# Patient Record
Sex: Female | Born: 1950
Health system: Southern US, Community
[De-identification: ages and names within clinical notes are randomized; demographics above are authoritative.]

## PROBLEM LIST (undated history)

## (undated) DIAGNOSIS — J449 Chronic obstructive pulmonary disease, unspecified: Secondary | ICD-10-CM

## (undated) DIAGNOSIS — E876 Hypokalemia: Secondary | ICD-10-CM

## (undated) DIAGNOSIS — N200 Calculus of kidney: Secondary | ICD-10-CM

## (undated) DIAGNOSIS — D689 Coagulation defect, unspecified: Secondary | ICD-10-CM

## (undated) DIAGNOSIS — N189 Chronic kidney disease, unspecified: Secondary | ICD-10-CM

## (undated) DIAGNOSIS — I607 Nontraumatic subarachnoid hemorrhage from unspecified intracranial artery: Secondary | ICD-10-CM

## (undated) DIAGNOSIS — I10 Essential (primary) hypertension: Secondary | ICD-10-CM

## (undated) DIAGNOSIS — Z4541 Encounter for adjustment and management of cerebrospinal fluid drainage device: Secondary | ICD-10-CM

## (undated) DIAGNOSIS — I701 Atherosclerosis of renal artery: Secondary | ICD-10-CM

## (undated) HISTORY — DX: Chronic obstructive pulmonary disease, unspecified: J44.9

## (undated) HISTORY — DX: Chronic kidney disease, unspecified: N18.9

## (undated) HISTORY — PX: OTHER SURGICAL HISTORY: SHX169

---

## 1999-01-06 ENCOUNTER — Other Ambulatory Visit: Admission: RE | Admit: 1999-01-06 | Discharge: 1999-01-06 | Payer: Self-pay | Admitting: Obstetrics and Gynecology

## 2000-06-14 ENCOUNTER — Other Ambulatory Visit: Admission: RE | Admit: 2000-06-14 | Discharge: 2000-06-14 | Payer: Self-pay | Admitting: Obstetrics and Gynecology

## 2000-07-18 ENCOUNTER — Ambulatory Visit (HOSPITAL_COMMUNITY): Admission: RE | Admit: 2000-07-18 | Discharge: 2000-07-18 | Payer: Self-pay | Admitting: Obstetrics and Gynecology

## 2005-04-24 HISTORY — PX: ABDOMINAL HYSTERECTOMY: SHX81

## 2011-06-30 ENCOUNTER — Emergency Department (HOSPITAL_COMMUNITY): Payer: Medicare Other

## 2011-06-30 ENCOUNTER — Other Ambulatory Visit: Payer: Self-pay

## 2011-06-30 ENCOUNTER — Inpatient Hospital Stay (HOSPITAL_COMMUNITY)
Admission: EM | Admit: 2011-06-30 | Discharge: 2011-07-01 | DRG: 305 | Disposition: A | Discharge status: Home / Self Care | Payer: Medicare Other | Attending: Internal Medicine | Admitting: Internal Medicine

## 2011-06-30 ENCOUNTER — Encounter (HOSPITAL_COMMUNITY): Payer: Self-pay | Admitting: *Deleted

## 2011-06-30 DIAGNOSIS — D689 Coagulation defect, unspecified: Secondary | ICD-10-CM | POA: Diagnosis present

## 2011-06-30 DIAGNOSIS — I160 Hypertensive urgency: Secondary | ICD-10-CM | POA: Diagnosis present

## 2011-06-30 DIAGNOSIS — E1169 Type 2 diabetes mellitus with other specified complication: Secondary | ICD-10-CM | POA: Diagnosis present

## 2011-06-30 DIAGNOSIS — I10 Essential (primary) hypertension: Principal | ICD-10-CM | POA: Diagnosis present

## 2011-06-30 DIAGNOSIS — Z982 Presence of cerebrospinal fluid drainage device: Secondary | ICD-10-CM

## 2011-06-30 DIAGNOSIS — F17200 Nicotine dependence, unspecified, uncomplicated: Secondary | ICD-10-CM | POA: Diagnosis present

## 2011-06-30 DIAGNOSIS — Z87898 Personal history of other specified conditions: Secondary | ICD-10-CM

## 2011-06-30 DIAGNOSIS — E876 Hypokalemia: Secondary | ICD-10-CM | POA: Diagnosis present

## 2011-06-30 DIAGNOSIS — Z720 Tobacco use: Secondary | ICD-10-CM | POA: Diagnosis present

## 2011-06-30 DIAGNOSIS — E119 Type 2 diabetes mellitus without complications: Secondary | ICD-10-CM | POA: Diagnosis present

## 2011-06-30 HISTORY — DX: Essential (primary) hypertension: I10

## 2011-06-30 HISTORY — DX: Coagulation defect, unspecified: D68.9

## 2011-06-30 HISTORY — DX: Hypokalemia: E87.6

## 2011-06-30 HISTORY — DX: Encounter for adjustment and management of cerebrospinal fluid drainage device: Z45.41

## 2011-06-30 HISTORY — DX: Nontraumatic subarachnoid hemorrhage from unspecified intracranial artery: I60.7

## 2011-06-30 LAB — CBC
Hemoglobin: 13.8 g/dL (ref 12.0–15.0)
RBC: 4.89 MIL/uL (ref 3.87–5.11)
WBC: 7.6 10*3/uL (ref 4.0–10.5)

## 2011-06-30 LAB — BASIC METABOLIC PANEL
CO2: 29 mEq/L (ref 19–32)
Chloride: 102 mEq/L (ref 96–112)
Glucose, Bld: 79 mg/dL (ref 70–99)
Potassium: 3.2 mEq/L — ABNORMAL LOW (ref 3.5–5.1)
Sodium: 140 mEq/L (ref 135–145)

## 2011-06-30 LAB — TROPONIN I: Troponin I: <0.3 ng/mL (ref <0.30)

## 2011-06-30 LAB — GLUCOSE, CAPILLARY
Glucose-Capillary: 165 mg/dL — ABNORMAL HIGH (ref 70–99)
Glucose-Capillary: 63 mg/dL — ABNORMAL LOW (ref 70–99)

## 2011-06-30 MED ORDER — LABETALOL HCL 5 MG/ML IV SOLN
40.0000 mg | Freq: Once | INTRAVENOUS | Status: DC
  Filled 2011-06-30: qty 8

## 2011-06-30 MED ORDER — AMLODIPINE BESYLATE 5 MG PO TABS
10.0000 mg | ORAL_TABLET | Freq: Every day | ORAL | Status: DC
  Administered 2011-06-30 – 2011-07-01 (×2): 10 mg via ORAL
  Filled 2011-06-30 (×2): qty 2

## 2011-06-30 MED ORDER — TRIAMTERENE-HCTZ 37.5-25 MG PO TABS
1.0000 | ORAL_TABLET | Freq: Every day | ORAL | Status: DC
  Administered 2011-07-01: 1 via ORAL
  Filled 2011-06-30 (×3): qty 1

## 2011-06-30 MED ORDER — LABETALOL HCL 5 MG/ML IV SOLN
20.0000 mg | Freq: Once | INTRAVENOUS | Status: AC
  Administered 2011-06-30: 20 mg via INTRAVENOUS
  Filled 2011-06-30: qty 4

## 2011-06-30 NOTE — ED Notes (Signed)
Pt w/ hx of hypertension - pt was going to PCP today for lab work - pt was noted to be hypertensive w/ BP of 200/140 - pt also w/ hx of ruptured cerebral aneurysm. Pt denies HA at present.

## 2011-06-30 NOTE — ED Notes (Signed)
Campbell, MD at bedside

## 2011-06-30 NOTE — ED Provider Notes (Cosign Needed)
History     CSN: [id number]  Arrival date & time 06/30/11  Amanda Booker   First MD Initiated Contact with Patient 06/30/11 2005      Chief Complaint  Patient presents with  . Hypertension    (Consider location/radiation/quality/duration/timing/severity/associated sxs/prior treatment) HPI Amanda Booker is a 61 y.o. female  With a h/o hypertension, cerebral aneurysm, diabetes who presents to the Emergency Department complaining of high blood pressure. She was seen in the PCP office today and had a BP of 175/110. This was a follow up visit from two weeks ago when she was seen for similar blood pressure. Once home, daughter took BP which was 200/120. Denies fever, headache, vision changes, trouble speaking or swallowing, extremity weakness, nausea, vomiting.  PCP Dr. Lysbeth Galas   Past Medical History  Diagnosis Date  . Cerebral aneurysm rupture   . Hypertension   . Diabetes mellitus   . Cerebral ventricular shunt fitting or adjustment     No past surgical history on file.  No family history on file.  History  Substance Use Topics  . Smoking status: Current Everyday Smoker  . Smokeless tobacco: Not on file  . Alcohol Use: No    OB History    Grav Para Term Preterm Abortions TAB SAB Ect Mult Living                  Review of Systems A 10 review of systems reviewed and are negative for acute change except as noted in the HPI. Allergies  Review of patient's allergies indicates no known allergies.  Home Medications   Current Outpatient Rx  Name Route Sig Dispense Refill  . ATORVASTATIN CALCIUM 20 MG PO TABS Oral Take 20 mg by mouth daily.    Marland Kitchen GLIPIZIDE 10 MG PO TABS Oral Take 10 mg by mouth 2 (two) times daily.    Marland Kitchen LOSARTAN POTASSIUM 100 MG PO TABS Oral Take 100 mg by mouth daily.    . NEBIVOLOL HCL 10 MG PO TABS Oral Take 10 mg by mouth daily.    . QUINAPRIL HCL 40 MG PO TABS Oral Take 40 mg by mouth daily.    Marland Kitchen SITAGLIPTIN PHOSPHATE 100 MG PO TABS Oral Take 100 mg by  mouth daily.      BP 187/88  Pulse 77  Temp(Src) 98.3 F (36.8 C) (Oral)  Resp 22  Ht 5\' 9"  (1.753 m)  Wt 187 lb (84.823 kg)  BMI 27.62 kg/m2  SpO2 95%  Physical Exam Physical examination:  Nursing notes reviewed; Vital signs and O2 SAT reviewed;  Constitutional: Well developed, Well nourished, Well hydrated, In no acute distress; Head:  Normocephalic, atraumatic; Eyes: EOMI, PERRL, No scleral icterus, no papilledema; ENMT: Mouth and pharynx normal, Mucous membranes moist; Neck: Supple, Full range of motion, No lymphadenopathy; Cardiovascular: Regular rate and rhythm, No murmur, rub, or gallop; Respiratory: Breath sounds clear & equal bilaterally, No rales, rhonchi, wheezes, or rub, Normal respiratory effort/excursion; Chest: Nontender, Movement normal; Abdomen: Soft, Nontender, Nondistended, Normal bowel sounds; Genitourinary: No CVA tenderness; Extremities: Pulses normal, No tenderness, No edema, No calf edema or asymmetry.; Neuro: AA&Ox3, Major CN grossly intact, speech is clear and slightly slowed, which is patient's baseline, mild facial asymetry.  No gross focal motor or sensory deficits in extremities.; Skin: Color normal, Warm, Dry  ED Course  Procedures (including critical care time)  Results for orders placed during the hospital encounter of 06/30/11  CBC      Component Value Range   WBC  7.6  4.0 - 10.5 (K/uL)   RBC 4.89  3.87 - 5.11 (MIL/uL)   Hemoglobin 13.8  12.0 - 15.0 (g/dL)   HCT 96.0  45.4 - 09.8 (%)   MCV 83.4  78.0 - 100.0 (fL)   MCH 28.2  26.0 - 34.0 (pg)   MCHC 33.8  30.0 - 36.0 (g/dL)   RDW 11.9  14.7 - 82.9 (%)   Platelets 302  150 - 400 (K/uL)  BASIC METABOLIC PANEL      Component Value Range   Sodium 140  135 - 145 (mEq/L)   Potassium 3.2 (*) 3.5 - 5.1 (mEq/L)   Chloride 102  96 - 112 (mEq/L)   CO2 29  19 - 32 (mEq/L)   Glucose, Bld 79  70 - 99 (mg/dL)   BUN 13  6 - 23 (mg/dL)   Creatinine, Ser 5.62  0.50 - 1.10 (mg/dL)   Calcium 13.0  8.4 - 10.5  (mg/dL)   GFR calc non Af Amer >90  >90 (mL/min)   GFR calc Af Amer >90  >90 (mL/min)  TROPONIN I      Component Value Range   Troponin I <0.30  <0.30 (ng/mL)    Date: 06/30/2011  2029  Rate: 81  Rhythm: normal sinus rhythm  QRS Axis: normal  Intervals: normal  ST/T Wave abnormalities: normal  Conduction Disutrbances:none  Narrative Interpretation:   Old EKG Reviewed: none available  2050 Spoke with Dr. Orvan Falconer, hospitalist who will admit the patient to step down. MDM  Patient with h/o hypertension and a cerebral aneurysm with rupture and repair here with urgent hypertension. With single dose of labetelol, 20 mg BP responded to 187/88. Admission for further treatment and stabilization of blood pressure.The patient appears reasonably stabilized for admission considering the current resources, flow, and capabilities available in the ED at this time, and I doubt any other [facility] requiring further screening and/or treatment in the ED prior to admission.   MDM Reviewed: nursing note and vitals Interpretation: labs, ECG and x-ray Consults: hospitalist.           Nicoletta Dress. Colon Branch, MD 06/30/11 2121

## 2011-07-01 ENCOUNTER — Encounter (HOSPITAL_COMMUNITY): Payer: Self-pay | Admitting: Internal Medicine

## 2011-07-01 DIAGNOSIS — Z720 Tobacco use: Secondary | ICD-10-CM | POA: Diagnosis present

## 2011-07-01 DIAGNOSIS — I160 Hypertensive urgency: Secondary | ICD-10-CM | POA: Diagnosis present

## 2011-07-01 DIAGNOSIS — E785 Hyperlipidemia, unspecified: Secondary | ICD-10-CM | POA: Diagnosis present

## 2011-07-01 LAB — CBC
HCT: 40.8 % (ref 36.0–46.0)
Hemoglobin: 13.5 g/dL (ref 12.0–15.0)
MCH: 27.8 pg (ref 26.0–34.0)
MCV: 84.1 fL (ref 78.0–100.0)
RBC: 4.85 MIL/uL (ref 3.87–5.11)
WBC: 5.4 10*3/uL (ref 4.0–10.5)

## 2011-07-01 LAB — COMPREHENSIVE METABOLIC PANEL
AST: 11 U/L (ref 0–37)
BUN: 11 mg/dL (ref 6–23)
CO2: 28 mEq/L (ref 19–32)
Calcium: 10 mg/dL (ref 8.4–10.5)
Chloride: 104 mEq/L (ref 96–112)
Creatinine, Ser: 0.56 mg/dL (ref 0.50–1.10)
GFR calc Af Amer: >90 mL/min (ref >90)
GFR calc non Af Amer: >90 mL/min (ref >90)
Glucose, Bld: 107 mg/dL — ABNORMAL HIGH (ref 70–99)
Total Bilirubin: 0.3 mg/dL (ref 0.3–1.2)

## 2011-07-01 LAB — HEMOGLOBIN A1C: Hgb A1c MFr Bld: 7.1 % — ABNORMAL HIGH (ref <5.7)

## 2011-07-01 LAB — GLUCOSE, CAPILLARY: Glucose-Capillary: 125 mg/dL — ABNORMAL HIGH (ref 70–99)

## 2011-07-01 LAB — APTT: aPTT: 52 seconds — ABNORMAL HIGH (ref 24–37)

## 2011-07-01 MED ORDER — CLONIDINE HCL 0.2 MG PO TABS
0.2000 mg | ORAL_TABLET | ORAL | Status: DC | PRN
  Administered 2011-07-01: 0.2 mg via ORAL
  Filled 2011-07-01: qty 1

## 2011-07-01 MED ORDER — POTASSIUM CHLORIDE CRYS ER 20 MEQ PO TBCR
40.0000 meq | EXTENDED_RELEASE_TABLET | Freq: Every day | ORAL | Status: AC
  Administered 2011-07-01: 40 meq via ORAL
  Filled 2011-07-01: qty 2

## 2011-07-01 MED ORDER — SODIUM CHLORIDE 0.9 % IJ SOLN
3.0000 mL | Freq: Two times a day (BID) | INTRAMUSCULAR | Status: DC
  Administered 2011-07-01 (×2): 3 mL via INTRAVENOUS
  Filled 2011-07-01 (×2): qty 3

## 2011-07-01 MED ORDER — HYDRALAZINE HCL 20 MG/ML IJ SOLN
10.0000 mg | INTRAMUSCULAR | Status: DC | PRN
  Administered 2011-07-01: 10 mg via INTRAVENOUS
  Filled 2011-07-01: qty 1

## 2011-07-01 MED ORDER — AMLODIPINE BESYLATE 5 MG PO TABS: 5.0000 mg | ORAL_TABLET | Freq: Every day | ORAL | Status: DC

## 2011-07-01 MED ORDER — ACETAMINOPHEN 650 MG RE SUPP: 650.0000 mg | Freq: Four times a day (QID) | RECTAL | Status: DC | PRN

## 2011-07-01 MED ORDER — ONDANSETRON HCL 4 MG PO TABS: 4.0000 mg | ORAL_TABLET | Freq: Four times a day (QID) | ORAL | Status: DC | PRN

## 2011-07-01 MED ORDER — LINAGLIPTIN 5 MG PO TABS
5.0000 mg | ORAL_TABLET | Freq: Every day | ORAL | Status: DC
  Administered 2011-07-01: 5 mg via ORAL
  Filled 2011-07-01: qty 1

## 2011-07-01 MED ORDER — LISINOPRIL 10 MG PO TABS
40.0000 mg | ORAL_TABLET | Freq: Every day | ORAL | Status: DC
  Administered 2011-07-01: 40 mg via ORAL
  Filled 2011-07-01: qty 4

## 2011-07-01 MED ORDER — TRAZODONE HCL 50 MG PO TABS: 25.0000 mg | ORAL_TABLET | Freq: Every evening | ORAL | Status: DC | PRN

## 2011-07-01 MED ORDER — SIMVASTATIN 20 MG PO TABS: 40.0000 mg | ORAL_TABLET | Freq: Every day | ORAL | Status: DC

## 2011-07-01 MED ORDER — ATENOLOL 25 MG PO TABS: 25.0000 mg | ORAL_TABLET | Freq: Every day | ORAL | Status: DC

## 2011-07-01 MED ORDER — MAGNESIUM OXIDE 400 MG PO TABS: 400.0000 mg | ORAL_TABLET | Freq: Every day | ORAL | Status: DC

## 2011-07-01 MED ORDER — OXYCODONE HCL 5 MG PO TABS: 5.0000 mg | ORAL_TABLET | ORAL | Status: DC | PRN

## 2011-07-01 MED ORDER — INSULIN ASPART 100 UNIT/ML ~~LOC~~ SOLN
0.0000 [IU] | Freq: Three times a day (TID) | SUBCUTANEOUS | Status: DC
  Administered 2011-07-01: 1 [IU] via SUBCUTANEOUS
  Administered 2011-07-01: 2 [IU] via SUBCUTANEOUS

## 2011-07-01 MED ORDER — ONDANSETRON HCL 4 MG/2ML IJ SOLN: 4.0000 mg | Freq: Four times a day (QID) | INTRAMUSCULAR | Status: DC | PRN

## 2011-07-01 MED ORDER — POTASSIUM CHLORIDE CRYS ER 20 MEQ PO TBCR: 20.0000 meq | EXTENDED_RELEASE_TABLET | Freq: Every day | ORAL | Status: DC

## 2011-07-01 MED ORDER — NICOTINE 14 MG/24HR TD PT24
14.0000 mg | MEDICATED_PATCH | Freq: Every day | TRANSDERMAL | Status: DC
  Administered 2011-07-01: 14 mg via TRANSDERMAL
  Filled 2011-07-01: qty 1

## 2011-07-01 MED ORDER — ACETAMINOPHEN 325 MG PO TABS: 650.0000 mg | ORAL_TABLET | Freq: Four times a day (QID) | ORAL | Status: DC | PRN

## 2011-07-01 MED ORDER — QUINAPRIL HCL 10 MG PO TABS: 40.0000 mg | ORAL_TABLET | Freq: Every day | ORAL | Status: DC

## 2011-07-01 MED ORDER — NEBIVOLOL HCL 10 MG PO TABS
10.0000 mg | ORAL_TABLET | Freq: Every day | ORAL | Status: DC
  Administered 2011-07-01: 10 mg via ORAL
  Filled 2011-07-01 (×3): qty 1

## 2011-07-01 MED ORDER — MAGNESIUM OXIDE 400 MG PO TABS
400.0000 mg | ORAL_TABLET | Freq: Every day | ORAL | Status: DC
  Administered 2011-07-01: 400 mg via ORAL
  Filled 2011-07-01: qty 1

## 2011-07-01 MED ORDER — INSULIN ASPART 100 UNIT/ML ~~LOC~~ SOLN
0.0000 [IU] | Freq: Every day | SUBCUTANEOUS | Status: DC
  Filled 2011-07-01: qty 3

## 2011-07-01 MED ORDER — GLIPIZIDE 5 MG PO TABS
10.0000 mg | ORAL_TABLET | Freq: Two times a day (BID) | ORAL | Status: DC
  Administered 2011-07-01 (×2): 10 mg via ORAL
  Filled 2011-07-01 (×2): qty 2

## 2011-07-01 MED ORDER — POTASSIUM CHLORIDE CRYS ER 20 MEQ PO TBCR
20.0000 meq | EXTENDED_RELEASE_TABLET | Freq: Every day | ORAL | Status: AC
  Administered 2011-07-01: 20 meq via ORAL
  Filled 2011-07-01: qty 1

## 2011-07-01 MED ORDER — LOSARTAN POTASSIUM 50 MG PO TABS
100.0000 mg | ORAL_TABLET | Freq: Every day | ORAL | Status: DC
  Administered 2011-07-01: 100 mg via ORAL
  Filled 2011-07-01: qty 2
  Filled 2011-07-01: qty 1

## 2011-07-01 MED ORDER — BISACODYL 10 MG RE SUPP: 10.0000 mg | Freq: Every day | RECTAL | Status: DC | PRN

## 2011-07-01 MED ORDER — ATORVASTATIN CALCIUM 10 MG PO TABS
20.0000 mg | ORAL_TABLET | Freq: Every day | ORAL | Status: DC
Start: 2011-07-01 — End: 2011-07-01
  Administered 2011-07-01: 20 mg via ORAL
  Filled 2011-07-01: qty 2

## 2011-07-01 NOTE — Progress Notes (Signed)
Discharge instructions reviewed with patient and family, patient and family verbalized understanding. Patient given discharge instructions and prescriptions. Patient in stable condition and escorted out by tech.

## 2011-07-01 NOTE — Discharge Summary (Signed)
Physician Discharge Summary  Amanda Booker MRN: [id number] DOB/AGE: 1950/07/27 61 y.o.  PCP: Joette Catching, MD.   Admit date: 06/30/2011 Discharge date: 07/01/2011  Discharge Diagnoses:  1. Malignant hypertension. 2. Type 2 diabetes mellitus. 3. Hypokalemia. 4. Tobacco abuse. 5. Coagulopathy of unknown etiology. The patient's INR was 2.05 and her PTT was 23.5. Her PTT was 52. Outpatient followup is warranted.    Medication List  As of 07/01/2011  5:22 PM   STOP taking these medications         losartan 100 MG tablet      nebivolol 10 MG tablet         TAKE these medications         amLODipine 5 MG tablet   Commonly known as: NORVASC   Take 1 tablet (5 mg total) by mouth daily.      atenolol 25 MG tablet   Commonly known as: TENORMIN   Take 1 tablet (25 mg total) by mouth daily.      atorvastatin 20 MG tablet   Commonly known as: LIPITOR   Take 20 mg by mouth daily.      glipiZIDE 10 MG tablet   Commonly known as: GLUCOTROL   Take 10 mg by mouth 2 (two) times daily.      magnesium oxide 400 MG tablet   Commonly known as: MAG-OX   Take 1 tablet (400 mg total) by mouth daily.      potassium chloride SA 20 MEQ tablet   Commonly known as: K-DUR,KLOR-CON   Take 1 tablet (20 mEq total) by mouth daily.      quinapril 40 MG tablet   Commonly known as: ACCUPRIL   Take 40 mg by mouth daily.      sitaGLIPtin 100 MG tablet   Commonly known as: JANUVIA   Take 100 mg by mouth daily.            Discharge Condition: stable and improved.   Disposition: Home.    Consults:None.    Significant Diagnostic Studies: Dg Chest Port 1 View  06/30/2011  *RADIOLOGY REPORT*  Clinical Data: Hypertension.  PORTABLE CHEST - 1 VIEW  Comparison: No priors.  Findings: Lung volumes are normal.  No consolidative airspace disease.  No pleural effusions.  No pneumothorax.  No pulmonary nodule or mass noted.  Pulmonary vasculature and the cardiomediastinal silhouette are within  normal limits.  Tubing is seen extending from the lower right cervical region into the upper abdomen, consistent with known ventriculoperitoneal shunt catheter.  IMPRESSION: 1. No radiographic evidence of acute cardiopulmonary disease.  Original Report Authenticated By: Florencia Reasons, M.D.     Microbiology: No results found for this or any previous visit (from the past 240 hour(s)).   Labs: Results for orders placed during the hospital encounter of 06/30/11 (from the past 48 hour(s))  CBC     Status: Normal   Collection Time   06/30/11  8:10 PM      Component Value Range Comment   WBC 7.6  4.0 - 10.5 (K/uL)    RBC 4.89  3.87 - 5.11 (MIL/uL)    Hemoglobin 13.8  12.0 - 15.0 (g/dL)    HCT 78.2  95.6 - 21.3 (%)    MCV 83.4  78.0 - 100.0 (fL)    MCH 28.2  26.0 - 34.0 (pg)    MCHC 33.8  30.0 - 36.0 (g/dL)    RDW 08.6  57.8 - 46.9 (%)  Platelets 302  150 - 400 (K/uL)   BASIC METABOLIC PANEL     Status: Abnormal   Collection Time   06/30/11  8:10 PM      Component Value Range Comment   Sodium 140  135 - 145 (mEq/L)    Potassium 3.2 (*) 3.5 - 5.1 (mEq/L)    Chloride 102  96 - 112 (mEq/L)    CO2 29  19 - 32 (mEq/L)    Glucose, Bld 79  70 - 99 (mg/dL)    BUN 13  6 - 23 (mg/dL)    Creatinine, Ser 4.54  0.50 - 1.10 (mg/dL)    Calcium 09.8  8.4 - 10.5 (mg/dL)    GFR calc non Af Amer >90  >90 (mL/min)    GFR calc Af Amer >90  >90 (mL/min)   TROPONIN I     Status: Normal   Collection Time   06/30/11  8:10 PM      Component Value Range Comment   Troponin I <0.30  <0.30 (ng/mL)   GLUCOSE, CAPILLARY     Status: Abnormal   Collection Time   06/30/11 10:42 PM      Component Value Range Comment   Glucose-Capillary 63 (*) 70 - 99 (mg/dL)    Comment 1 Documented in Chart      Comment 2 Notify RN     GLUCOSE, CAPILLARY     Status: Abnormal   Collection Time   06/30/11 11:35 PM      Component Value Range Comment   Glucose-Capillary 165 (*) 70 - 99 (mg/dL)    Comment 1 Documented in Chart        Comment 2 Notify RN     MAGNESIUM     Status: Normal   Collection Time   07/01/11  6:32 AM      Component Value Range Comment   Magnesium 1.8  1.5 - 2.5 (mg/dL)   APTT     Status: Abnormal   Collection Time   07/01/11  6:32 AM      Component Value Range Comment   aPTT 52 (*) 24 - 37 (seconds)   PROTIME-INR     Status: Abnormal   Collection Time   07/01/11  6:32 AM      Component Value Range Comment   Prothrombin Time 23.5 (*) 11.6 - 15.2 (seconds)    INR 2.05 (*) 0.00 - 1.49    CBC     Status: Normal   Collection Time   07/01/11  6:32 AM      Component Value Range Comment   WBC 5.4  4.0 - 10.5 (K/uL)    RBC 4.85  3.87 - 5.11 (MIL/uL)    Hemoglobin 13.5  12.0 - 15.0 (g/dL)    HCT 11.9  14.7 - 82.9 (%)    MCV 84.1  78.0 - 100.0 (fL)    MCH 27.8  26.0 - 34.0 (pg)    MCHC 33.1  30.0 - 36.0 (g/dL)    RDW 56.2  13.0 - 86.5 (%)    Platelets 302  150 - 400 (K/uL)   COMPREHENSIVE METABOLIC PANEL     Status: Abnormal   Collection Time   07/01/11  6:32 AM      Component Value Range Comment   Sodium 142  135 - 145 (mEq/L)    Potassium 3.2 (*) 3.5 - 5.1 (mEq/L)    Chloride 104  96 - 112 (mEq/L)    CO2 28  19 - 32 (mEq/L)  Glucose, Bld 107 (*) 70 - 99 (mg/dL)    BUN 11  6 - 23 (mg/dL)    Creatinine, Ser 1.61  0.50 - 1.10 (mg/dL)    Calcium 09.6  8.4 - 10.5 (mg/dL)    Total Protein 7.0  6.0 - 8.3 (g/dL)    Albumin 3.5  3.5 - 5.2 (g/dL)    AST 11  0 - 37 (U/L)    ALT 13  0 - 35 (U/L)    Alkaline Phosphatase 68  39 - 117 (U/L)    Total Bilirubin 0.3  0.3 - 1.2 (mg/dL)    GFR calc non Af Amer >90  >90 (mL/min)    GFR calc Af Amer >90  >90 (mL/min)   GLUCOSE, CAPILLARY     Status: Abnormal   Collection Time   07/01/11  7:30 AM      Component Value Range Comment   Glucose-Capillary 125 (*) 70 - 99 (mg/dL)   GLUCOSE, CAPILLARY     Status: Abnormal   Collection Time   07/01/11 11:22 AM      Component Value Range Comment   Glucose-Capillary 169 (*) 70 - 99 (mg/dL)   GLUCOSE, CAPILLARY      Status: Normal   Collection Time   07/01/11  4:36 PM      Component Value Range Comment   Glucose-Capillary 99  70 - 99 (mg/dL)      HPI : The patient is a Amanda Booker with the history of a ruptured cerebral aneurysm approximately 10 years ago, status post clipping, status post ventricular shunt, and diabetes mellitus, who presented to the emergency department on 06/30/2011 with a chief complaint of uncontrolled blood pressure. Apparently, the patient went to her primary care physician's office for laboratory studies. Her blood pressure was checked. Her systolic blood pressure was well over 200. Per the history, she had been taking multiple antihypertensive medications for years, but recently, her medication regimen was narrowed to losartan and Accupril. She was given Bystolic in Dr.Nyland's office and advised to come to the emergency department for further evaluation. She had no complaints of headache, dizziness, nausea, or vomiting. She had no complaints of chest pain, difficulty speaking, or focal weakness. In the emergency department, her blood pressure was 206/83. Otherwise, she was afebrile and hemodynamically stable. Her lab data were significant for a serum potassium of 3.2 and a normal troponin I. Her EKG revealed normal sinus rhythm and LVH. Her chest x-ray revealed no acute cardiopulmonary disease. She was admitted for further evaluation and management.   HOSPITAL COURSE: The patient was restarted on her former antihypertensive medications and her current antihypertensive medications including amlodipine, Accupril, losartan, Bystolic, and Maxzide. Also, when necessary clonidine and hydralazine were ordered. She was also continued on her diabetes regimen of Tradjenta and glipizide. Sliding scale NovoLog was added as well. She was started on potassium chloride supplementation for hypokalemia. She was given magnesium oxide empirically. Her blood pressure was monitored closely. Her blood  pressure fell to a nadir of 103/74. However, at the time of discharge, it stabilized at 143/80.   In my conversation with the patient's family, apparently, there had been recent changes in her antihypertensive medications over the past 6 months. She apparently was admitted on Accupril and losartan. Bystolic was given in her primary care physician's office prior to her arrival to the emergency department to try to decrease her blood pressure. In the review of her medications, she had been taking an ARB and ACE inhibitor. I  decided to adjust her medications to include a medication from 3 different classes including a beta blocker, calcium channel blocker, and an ACE inhibitor. Therefore, losartan was discontinued and the patient was advised to take amlodipine, atenolol, and Accupril at the time of discharge. This was discussed with her and her family who were in agreement and receptive to the changes. In addition, the patient was discharged on potassium chloride and magnesium oxide for 2 more weeks. She was also advised to follow a low-sodium diet. She was advised to get an automated blood pressure cuff to check her blood pressure at least once daily.  A TSH was ordered but it was pending at the time of discharge. She was advised to stop smoking. Nicotine replacement therapy was given during the hospitalization.  Of note, her PT was found to be elevated 23.5, with an INR of 2.05, and a PTT of 52. Her CBC otherwise was completely normal. Her liver transaminases were completely within normal limits. She denies taking any anticoagulants or antiplatelet medications. The etiology of this coagulopathy was unclear and unknown. I informed the patient and her family about this abnormality. She will followup with her primary care physician in 2 days. I advised her to inform him of these findings. Further evaluation will be deferred to Dr. Lysbeth Galas.        Discharge Exam:  Blood pressure 143/80, pulse 81,  temperature 98.5 F (36.9 C), temperature source Oral, resp. rate 18, height 5\' 7"  (1.702 m), weight 85.503 kg (188 lb 8 oz), SpO2 98.00%.   lungs: Clear to auscultation bilaterally. Heart: S1, S2, with a soft systolic murmur. Abdomen: Positive bowel sounds, soft, nontender, nondistended. Extremities: No pedal edema. Neurologic: She is alert and oriented x3. Cranial nerves II through XII are grossly intact.     Discharge Orders    Future Orders Please Complete By Expires   Diet - low sodium heart healthy      Diet Carb Modified      Increase activity slowly         Follow-up Information    Follow up with Josue Hector, MD in 2 days. (AS SCHEDULED.)    Contact information:   [address] [place] [address] [phone]          Discharge time: 45 minutes.   Signed: Tanveer Brammer 07/01/2011, 5:22 PM

## 2011-07-01 NOTE — H&P (Signed)
PCP:  Joette Catching M.D.  Chief Complaint:  Uncontrolled blood pressure  HPI: Amanda Booker is an 61 y.o. female.  History of  ruptured cerebral aneurysm over 10 years ago; status post clipping status post placement of ventricular shunt; so hypertension usually maintain him about 3 antihypertensives. Patient is always remained asymptomatic in terms of her blood pressure, but on a visit to her primary care physician in December, started a systematic taper of her blood pressure medications because the measured blood pressure was considered to be too low. She eventually was weaned down to just one medication, but on evaluation to the office today her blood pressure was found to be markedly elevated with systolic greater than 200, and she was started on Bystolic, and advised to come to the emergency room for evaluation.  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  She denies nausea or vomiting, acute dysarthria or dysphasia; niacin and focal lateralizing symptoms.  Rewiew of Systems:  The patient denies anorexia, fever, weight loss,, vision loss, decreased hearing, hoarseness, chest pain, syncope, dyspnea on exertion, peripheral edema, balance deficits, hemoptysis, abdominal pain, melena, hematochezia, severe indigestion/heartburn, hematuria, incontinence, genital sores, muscle weakness, suspicious skin lesions, transient blindness, difficulty walking, depression, unusual weight change, abnormal bleeding, enlarged lymph nodes, angioedema, and breast masses.    Past Medical History  Diagnosis Date  . Cerebral aneurysm rupture   . Hypertension   . Diabetes mellitus   . Cerebral ventricular shunt fitting or adjustment     History reviewed. No pertinent past surgical history.  Medications:  HOME MEDS: Prior to Admission medications   Medication Sig Start  Date End Date Taking? Authorizing Provider  atorvastatin (LIPITOR) 20 MG tablet Take 20 mg by mouth daily.   Yes Historical Provider, MD  glipiZIDE (GLUCOTROL) 10 MG tablet Take 10 mg by mouth 2 (two) times daily.   Yes Historical Provider, MD  losartan (COZAAR) 100 MG tablet Take 100 mg by mouth daily.   Yes Historical Provider, MD  nebivolol (BYSTOLIC) 10 MG tablet Take 10 mg by mouth daily.   Yes Historical Provider, MD  quinapril (ACCUPRIL) 40 MG tablet Take 40 mg by mouth daily.   Yes Historical Provider, MD  sitaGLIPtin (JANUVIA) 100 MG tablet Take 100 mg by mouth daily.   Yes Historical Provider, MD     Allergies:  No Known Allergies  Social History:   reports that she has been smoking Cigarettes.  She has a 11 pack-year smoking history. She does not have any smokeless tobacco history on file. She reports that she does not drink alcohol or use illicit drugs.  Family History: Family History  Problem Relation Age of Onset  . Cerebral aneurysm Sister   . Kidney failure Sister   . Cancer Mother     uterine  . Diabetes Brother   . Diabetes Brother      Physical Exam: Filed Vitals:   06/30/11 2215 06/30/11 2228 07/01/11 0141 07/01/11 0207  BP:  209/109  206/83  Pulse: 75 82 63 78  Temp:  97.8 F (36.6 C)    TempSrc:  Oral    Resp: 21 20 20    Height:  5\' 7"  (1.702 m)    Weight:  86.3 kg (190 lb 4.1 oz)    SpO2: 96% 96% 96%    Blood pressure 206/83, pulse 78, temperature 97.8 F (  36.6 C), temperature source Oral, resp. rate 20, height 5\' 7"  (1.702 m), weight 86.3 kg (190 lb 4.1 oz), SpO2 96.00%.  GEN:  Pleasant African American lady lying in the stretcher in no acute distress; cooperative with exam PSYCH:  alert and oriented x4; does not appear anxious does not appear depressed; affect is appropriate HEENT: Mucous membranes pink and anicteric; PERRLA; EOM intact; no cervical lymphadenopathy nor thyromegaly or carotid bruit; no JVD; Breasts:: Not examined CHEST WALL: No  tenderness CHEST: Normal respiration, clear to auscultation bilaterally HEART: Regular rate and rhythm; no murmurs rubs or gallops BACK: No kyphosis or scoliosis; no CVA tenderness ABDOMEN: Obese, soft non-tender; no masses, no organomegaly, normal abdominal bowel sounds; no pannus; no intertriginous candida. Rectal Exam: Not done EXTREMITIES: No bone or joint deformity; age-appropriate arthropathy of the hands and knees; no edema; no ulcerations. Genitalia: not examined PULSES: 2+ and symmetric SKIN: Normal hydration no rash or ulceration CNS: Cranial nerves 2-12 grossly intact no focal neurologic deficit   Labs & Imaging Results for orders placed during the hospital encounter of 06/30/11 (from the past 48 hour(s))  CBC     Status: Normal   Collection Time   06/30/11  8:10 PM      Component Value Range Comment   WBC 7.6  4.0 - 10.5 (K/uL)    RBC 4.89  3.87 - 5.11 (MIL/uL)    Hemoglobin 13.8  12.0 - 15.0 (g/dL)    HCT 62.9  52.8 - 41.3 (%)    MCV 83.4  78.0 - 100.0 (fL)    MCH 28.2  26.0 - 34.0 (pg)    MCHC 33.8  30.0 - 36.0 (g/dL)    RDW 24.4  01.0 - 27.2 (%)    Platelets 302  150 - 400 (K/uL)   BASIC METABOLIC PANEL     Status: Abnormal   Collection Time   06/30/11  8:10 PM      Component Value Range Comment   Sodium 140  135 - 145 (mEq/L)    Potassium 3.2 (*) 3.5 - 5.1 (mEq/L)    Chloride 102  96 - 112 (mEq/L)    CO2 29  19 - 32 (mEq/L)    Glucose, Bld 79  70 - 99 (mg/dL)    BUN 13  6 - 23 (mg/dL)    Creatinine, Ser 5.36  0.50 - 1.10 (mg/dL)    Calcium 64.4  8.4 - 10.5 (mg/dL)    GFR calc non Af Amer >90  >90 (mL/min)    GFR calc Af Amer >90  >90 (mL/min)   TROPONIN I     Status: Normal   Collection Time   06/30/11  8:10 PM      Component Value Range Comment   Troponin I <0.30  <0.30 (ng/mL)   GLUCOSE, CAPILLARY     Status: Abnormal   Collection Time   06/30/11 10:42 PM      Component Value Range Comment   Glucose-Capillary 63 (*) 70 - 99 (mg/dL)    Comment 1 Documented  in Chart      Comment 2 Notify RN     GLUCOSE, CAPILLARY     Status: Abnormal   Collection Time   06/30/11 11:35 PM      Component Value Range Comment   Glucose-Capillary 165 (*) 70 - 99 (mg/dL)    Comment 1 Documented in Chart      Comment 2 Notify RN      Dg Chest Port 1 [address]  06/30/2011  *RADIOLOGY REPORT*  Clinical Data: Hypertension.  PORTABLE CHEST - 1 VIEW  Comparison: No priors.  Findings: Lung volumes are normal.  No consolidative airspace disease.  No pleural effusions.  No pneumothorax.  No pulmonary nodule or mass noted.  Pulmonary vasculature and the cardiomediastinal silhouette are within normal limits.  Tubing is seen extending from the lower right cervical region into the upper abdomen, consistent with known ventriculoperitoneal shunt catheter.  IMPRESSION: 1. No radiographic evidence of acute cardiopulmonary disease.  Original Report Authenticated By: Florencia Reasons, M.D.      Assessment Present on Admission:  .Hypertensive urgency .Type II or unspecified type diabetes mellitus without mention of complication, not stated as uncontrolled .Tobacco abuse   PLAN: And she is asymptomatic, simply resume her home antihypertensives which she was taking and December, and allow her blood pressure to hopefully normalize itself. While in hospital will give when necessary clonidine and when necessary intravenous hydralazine, for excessively elevated blood pressure.  Counsel on tobacco cessation in light of her severe vascular disease;  Diabetes management as per orders  Other plans as per orders.   Amanda Booker 07/01/2011, 3:16 AM

## 2011-07-04 NOTE — Progress Notes (Signed)
Utilization review completed.  

## 2011-08-01 ENCOUNTER — Other Ambulatory Visit (HOSPITAL_COMMUNITY): Payer: Self-pay | Admitting: Family Medicine

## 2011-08-01 DIAGNOSIS — R52 Pain, unspecified: Secondary | ICD-10-CM

## 2011-08-01 DIAGNOSIS — I10 Essential (primary) hypertension: Secondary | ICD-10-CM

## 2011-08-02 ENCOUNTER — Ambulatory Visit (HOSPITAL_COMMUNITY)
Admission: RE | Admit: 2011-08-02 | Discharge: 2011-08-02 | Disposition: A | Discharge status: Home / Self Care | Payer: Medicare Other | Source: Ambulatory Visit | Attending: Family Medicine | Admitting: Family Medicine

## 2011-08-02 DIAGNOSIS — I10 Essential (primary) hypertension: Secondary | ICD-10-CM | POA: Insufficient documentation

## 2011-08-02 DIAGNOSIS — N1330 Unspecified hydronephrosis: Secondary | ICD-10-CM | POA: Insufficient documentation

## 2011-08-02 DIAGNOSIS — N200 Calculus of kidney: Secondary | ICD-10-CM | POA: Insufficient documentation

## 2011-08-02 DIAGNOSIS — Q619 Cystic kidney disease, unspecified: Secondary | ICD-10-CM | POA: Insufficient documentation

## 2011-08-08 ENCOUNTER — Other Ambulatory Visit (HOSPITAL_COMMUNITY): Payer: Self-pay | Admitting: Family Medicine

## 2011-08-08 DIAGNOSIS — I10 Essential (primary) hypertension: Secondary | ICD-10-CM

## 2011-08-09 ENCOUNTER — Ambulatory Visit (HOSPITAL_COMMUNITY)
Admission: RE | Admit: 2011-08-09 | Discharge: 2011-08-09 | Disposition: A | Discharge status: Home / Self Care | Payer: Medicare Other | Source: Ambulatory Visit | Attending: Family Medicine | Admitting: Family Medicine

## 2011-08-09 DIAGNOSIS — Q619 Cystic kidney disease, unspecified: Secondary | ICD-10-CM | POA: Insufficient documentation

## 2011-08-09 DIAGNOSIS — I10 Essential (primary) hypertension: Secondary | ICD-10-CM | POA: Insufficient documentation

## 2011-08-09 MED ORDER — IOHEXOL 350 MG/ML SOLN
100.0000 mL | Freq: Once | INTRAVENOUS | Status: AC | PRN
  Administered 2011-08-09: 100 mL via INTRAVENOUS

## 2011-09-05 ENCOUNTER — Other Ambulatory Visit (HOSPITAL_COMMUNITY): Payer: Self-pay | Admitting: Family Medicine

## 2011-09-05 DIAGNOSIS — Z139 Encounter for screening, unspecified: Secondary | ICD-10-CM

## 2011-09-11 ENCOUNTER — Ambulatory Visit (HOSPITAL_COMMUNITY)
Admission: RE | Admit: 2011-09-11 | Discharge: 2011-09-11 | Disposition: A | Discharge status: Home / Self Care | Payer: Medicare Other | Source: Ambulatory Visit | Attending: Family Medicine | Admitting: Family Medicine

## 2011-09-11 DIAGNOSIS — Z139 Encounter for screening, unspecified: Secondary | ICD-10-CM

## 2011-09-11 DIAGNOSIS — Z1231 Encounter for screening mammogram for malignant neoplasm of breast: Secondary | ICD-10-CM | POA: Insufficient documentation

## 2011-09-13 ENCOUNTER — Encounter (INDEPENDENT_AMBULATORY_CARE_PROVIDER_SITE_OTHER): Payer: Self-pay | Admitting: *Deleted

## 2011-11-23 ENCOUNTER — Encounter: Payer: Self-pay | Admitting: Vascular Surgery

## 2011-11-24 ENCOUNTER — Encounter: Payer: Self-pay | Admitting: *Deleted

## 2011-11-24 ENCOUNTER — Other Ambulatory Visit: Payer: Self-pay | Admitting: *Deleted

## 2011-11-24 ENCOUNTER — Ambulatory Visit (INDEPENDENT_AMBULATORY_CARE_PROVIDER_SITE_OTHER): Payer: Medicare Other | Admitting: Vascular Surgery

## 2011-11-24 ENCOUNTER — Encounter: Payer: Self-pay | Admitting: Vascular Surgery

## 2011-11-24 ENCOUNTER — Encounter (HOSPITAL_COMMUNITY): Payer: Self-pay | Admitting: Pharmacy Technician

## 2011-11-24 VITALS — BP 165/73 | HR 66 | Temp 97.7°F | Ht 67.0 in | Wt 189.0 lb

## 2011-11-24 DIAGNOSIS — I701 Atherosclerosis of renal artery: Secondary | ICD-10-CM | POA: Insufficient documentation

## 2011-11-24 NOTE — Progress Notes (Addendum)
[facility] [place]  Referred by:  Leonard Robert Nyland, MD [address] [place], [place] [address]  Reason for referral: renal artery stenosis  History of Present Illness  Amanda Booker is a 61 y.o. (03/11/1951) female who presents with chief complaint: poorly controlled hypertension.  Patient recently underwent CTA abd/pelvis to evaluate the kidneys for possible RAS.  Her PCP is concerned with escalating need for BP rx and poor response to medication.  Her BP is routinely > 200 on her current two drug regimen.  She denies any changes in urinary habits.  He also has known renal stone and cyst in her kidney.  Past Medical History  Diagnosis Date  . Cerebral aneurysm rupture   . Hypertension   . Diabetes mellitus   . Cerebral ventricular shunt fitting or adjustment   . Hypokalemia   . Coagulopathy   . Chronic kidney disease     Past Surgical History  Procedure Date  . Abdominal hysterectomy 2007    History   Social History  . Marital Status: Married    Spouse Name: N/A    Number of Children: N/A  . Years of Education: N/A   Occupational History  . Not on file.   Social History Main Topics  . Smoking status: Current Everyday Smoker -- 0.5 packs/day for 22 years    Types: Cigarettes  . Smokeless tobacco: Never Used  . Alcohol Use: Yes     special occasions  . Drug Use: No  . Sexually Active: Not on file   Other Topics Concern  . Not on file   Social History Narrative  . No narrative on file    Family History  Problem Relation Age of Onset  . Cerebral aneurysm Sister   . Hypertension Sister   . Kidney failure Sister   . Cancer Mother     uterine  . Hyperlipidemia Mother   . Diabetes Brother   . Hypertension Brother   . Diabetes Brother   . Hypertension Brother     Current Outpatient Prescriptions on File Prior to Visit  Medication Sig Dispense Refill  . amLODipine (NORVASC) 5 MG tablet Take 1 tablet (5 mg total) by mouth daily.   30 tablet  1  . atorvastatin (LIPITOR) 20 MG tablet Take 20 mg by mouth daily.      . cloNIDine (CATAPRES) 0.2 MG tablet Take 2 mg by mouth daily.      . glipiZIDE (GLUCOTROL) 10 MG tablet Take 10 mg by mouth 2 (two) times daily.      . quinapril (ACCUPRIL) 40 MG tablet Take 40 mg by mouth daily.      . sitaGLIPtin (JANUVIA) 100 MG tablet Take 100 mg by mouth daily.      . atenolol (TENORMIN) 25 MG tablet Take 1 tablet (25 mg total) by mouth daily.  30 tablet  1  . magnesium oxide (MAG-OX) 400 MG tablet Take 1 tablet (400 mg total) by mouth daily.  14 tablet  0  . potassium chloride SA (K-DUR,KLOR-CON) 20 MEQ tablet Take 1 tablet (20 mEq total) by mouth daily.  15 tablet  0    No Known Allergies  Review of Systems (Positive items checked otherwise negative)  General: [ ] Weight loss, [ ] Weight gain, [ ]  Loss of appetite, [ ] Fever  Neurologic: [x] Numbness in arms or legs , [x] Temporary loss of vision in one eye [ ] Headaches, [ ] Seizure  Ear/Nose/Throat: [ ]   Change in eyesight, [ ] Change in hearing, [ ] Nose bleeds, [ ] Sore throat  Vascular: [ ] Pain in legs with walking, [x] Pain in feet while lying flat, [ ] Non-healing ulcer, [ ] Stroke, [ ] "Mini stroke", [ ] Slurred speech, [ ] Temporary blindness, [ ] Blood clot in vein, [ ] Phlebitis  Pulmonary: [ ] Home oxygen, [x] Productive cough, [ ] Bronchitis, [ ] Coughing up blood, [ ] Asthma, [x] Wheezing  Musculoskeletal: [ ] Arthritis, [ ] Joint pain, [ ] Muscle pain  Cardiac: [ ] Chest pain, [ ] Chest tightness/pressure, [ ] Shortness of breath when lying flat, [ ] Shortness of breath with exertion, [ ] Palpitations, [ ] Heart murmur, [ ] Arrythmia, [ ] Atrial fibrillation  Hematologic: [ ] Bleeding problems, [ ] Clotting disorder, [ ] Anemia  Psychiatric:  [ ] Depression, [ ] Anxiety, [ ] Attention deficit disorder  Gastrointestinal:  [ ] Black stool,[ ]  Blood in stool, [ ] Peptic ulcer disease, [ ] Reflux, [ ] Hiatal  hernia, [ ] Trouble swallowing, [ ] Diarrhea, [ ] Constipation  Urinary:  [ ] Kidney disease, [ ] Burning with urination, [ ] Frequent urination, [ ] Difficulty urinating  Skin: [ ] Ulcers, [ ] Rashes   Physical Examination  Filed Vitals:   11/24/11 0904  BP: 165/73  Pulse: 66  Temp: 97.7 F (36.5 C)  TempSrc: Oral  Height: 5' 7" (1.702 m)  Weight: 189 lb (85.73 kg)  SpO2: 99%   Body mass index is 29.60 kg/(m^2).  General: A&O x 3, WDWN, mildly obese  Head: /AT  Ear/Nose/Throat: Hearing grossly intact, nares w/o erythema or drainage, oropharynx w/o Erythema/Exudate  Eyes: PERRLA, EOMI  Neck: Supple, no nuchal rigidity, no palpable LAD  Pulmonary: Sym exp, good air movt, CTAB, no rales & rhonchi,  + wheezing  Cardiac: RRR, Nl S1, S2, no Murmurs, rubs or gallops  Vascular: Vessel Right Left  Radial Palpable Palpable  Brachial Palpable Palpable  Carotid Palpable, without bruit Palpable, without bruit  Aorta Non-palpable N/A  Femoral Palpable Palpable  Popliteal Non-palpable Non-palpable  PT Faintly Palpable Faintly Palpable  DP Faintly Palpable Faintly Palpable   Gastrointestinal: soft, NTND, -G/R, - HSM, - masses, - CVAT B  Musculoskeletal: M/S 5/5 throughout: left side slightly weaker , Extremities without ischemic changes   Neurologic: CN 2-12 intact , Pain and light touch intact in extremities , Motor exam as listed above  Psychiatric: Judgment intact, Mood & affect appropriate for pt's clinical situation  Dermatologic: See M/S exam for extremity exam, no rashes otherwise noted  Lymph : No Cervical, Axillary, or Inguinal lymphadenopathy   CTA Abd/Pelvis (08/09/11)  1. The right kidney is noted to be slightly atrophic in comparison to the contralateral left kidney. 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  2. Duplicated left-sided renal arteries without hemodynamically significant narrowing.  3. Moderate predominantly calcified atherosclerotic plaque within a nonaneurysmal abdominal aorta.  4. Multiple nonobstructing right-sided renal stones. Solitary nonobstructing left-sided renal stone. No urinary obstruction.  Based on the images I have available, this patient has calcific aorta without aneurysmal disease.  All mesenteric and renal vessel appear patent.  I cannot see the stenosis noted in the report.  Outside Studies/Documentation 10 pages of outside documents were reviewed including: clinic charts,   CT scan, and renal duplex report.  Medical Decision Making  Amanda Booker is a 61 y.o. female who presents with: poor controlled hypertension, possible R RAS.   In this case with asx kidneys, possible R renal artery stenosis and poorly controlled hypertension, it is reasonable to proceed with Aortogram, right renal artery angiogram with possible intervention. I discussed with the patient the nature of angiographic procedures, especially the limited patencies of any endovascular intervention.  The patient is aware of that the risks of an angiographic procedure include but are not limited to: bleeding, infection, access site complications, renal failure, embolization, rupture of vessel, dissection, possible need for emergent surgical intervention, possible need for surgical procedures to treat the patient's pathology, and stroke and death.   The patient is aware of the risks and agrees to proceed.  This procedure is scheduled for this Thursday, 6 AUG 13. I discussed with the patient and family, the need to hydrate the day prior to the procedure, the day of, and the day after in order to reduce the nephrotoxicity of the dye load.  I discussed in depth with the patient the nature of atherosclerosis, and emphasized the importance of maximal medical management  including strict control of blood pressure, blood glucose, and lipid levels, antiplatelet agents, obtaining regular exercise, and cessation of smoking.  The patient is aware that without maximal medical management the underlying atherosclerotic disease process will progress, limiting the benefit of any interventions.  Thank you for allowing us to participate in this patient's care.  Brian Chen, MD [facility] [place] Office: [phone] Pager: [phone]  11/24/2011, 9:38 AM   

## 2011-11-27 ENCOUNTER — Encounter: Payer: Self-pay | Admitting: Vascular Surgery

## 2011-11-29 MED ORDER — SODIUM CHLORIDE 0.9 % IV SOLN
INTRAVENOUS | Status: DC
  Administered 2011-11-30: 100 mL/h via INTRAVENOUS

## 2011-11-30 ENCOUNTER — Telehealth: Payer: Self-pay | Admitting: Vascular Surgery

## 2011-11-30 ENCOUNTER — Ambulatory Visit (HOSPITAL_COMMUNITY)
Admission: RE | Admit: 2011-11-30 | Discharge: 2011-11-30 | Disposition: A | Discharge status: Home / Self Care | Payer: Medicare Other | Source: Ambulatory Visit | Attending: Vascular Surgery | Admitting: Vascular Surgery

## 2011-11-30 ENCOUNTER — Encounter (HOSPITAL_COMMUNITY)
Admission: RE | Disposition: A | Discharge status: Home / Self Care | Payer: Self-pay | Source: Ambulatory Visit | Attending: Vascular Surgery

## 2011-11-30 DIAGNOSIS — I701 Atherosclerosis of renal artery: Secondary | ICD-10-CM

## 2011-11-30 DIAGNOSIS — I10 Essential (primary) hypertension: Secondary | ICD-10-CM | POA: Insufficient documentation

## 2011-11-30 DIAGNOSIS — E119 Type 2 diabetes mellitus without complications: Secondary | ICD-10-CM | POA: Insufficient documentation

## 2011-11-30 DIAGNOSIS — Q649 Congenital malformation of urinary system, unspecified: Secondary | ICD-10-CM | POA: Insufficient documentation

## 2011-11-30 LAB — POCT I-STAT, CHEM 8
Calcium, Ion: 1.31 mmol/L — ABNORMAL HIGH (ref 1.12–1.23)
HCT: 43 % (ref 36.0–46.0)
Sodium: 143 mEq/L (ref 135–145)
TCO2: 24 mmol/L (ref 0–100)

## 2011-11-30 SURGERY — ABDOMINAL ANGIOGRAM

## 2011-11-30 MED ORDER — ACETAMINOPHEN 325 MG PO TABS: 650.0000 mg | ORAL_TABLET | ORAL | Status: DC | PRN

## 2011-11-30 MED ORDER — LABETALOL HCL 5 MG/ML IV SOLN
INTRAVENOUS | Status: AC
  Filled 2011-11-30: qty 4

## 2011-11-30 MED ORDER — HEPARIN (PORCINE) IN NACL 2-0.9 UNIT/ML-% IJ SOLN
INTRAMUSCULAR | Status: AC
  Filled 2011-11-30: qty 1000

## 2011-11-30 MED ORDER — MIDAZOLAM HCL 2 MG/2ML IJ SOLN
INTRAMUSCULAR | Status: AC
  Filled 2011-11-30: qty 2

## 2011-11-30 MED ORDER — LIDOCAINE HCL (PF) 1 % IJ SOLN
INTRAMUSCULAR | Status: AC
  Filled 2011-11-30: qty 30

## 2011-11-30 MED ORDER — ONDANSETRON HCL 4 MG/2ML IJ SOLN: 4.0000 mg | Freq: Four times a day (QID) | INTRAMUSCULAR | Status: DC | PRN

## 2011-11-30 MED ORDER — HYDRALAZINE HCL 20 MG/ML IJ SOLN
INTRAMUSCULAR | Status: AC
  Filled 2011-11-30: qty 1

## 2011-11-30 MED ORDER — FENTANYL CITRATE 0.05 MG/ML IJ SOLN
INTRAMUSCULAR | Status: AC
  Filled 2011-11-30: qty 2

## 2011-11-30 MED ORDER — SODIUM CHLORIDE 0.9 % IV SOLN: 1.0000 mL/kg/h | INTRAVENOUS | Status: DC

## 2011-11-30 MED ORDER — HYDRALAZINE HCL 20 MG/ML IJ SOLN
10.0000 mg | Freq: Once | INTRAMUSCULAR | Status: AC
  Administered 2011-11-30: 10 mg via INTRAVENOUS

## 2011-11-30 MED ORDER — OXYCODONE-ACETAMINOPHEN 5-325 MG PO TABS: 1.0000 | ORAL_TABLET | ORAL | Status: DC | PRN

## 2011-11-30 NOTE — Interval H&P Note (Signed)
[facility] [place]  History and Physical Update  The patient was interviewed and re-examined.  The patient's previous History and Physical has been reviewed and is unchanged.  There is no change in the plan of care.  Leonides Sake, MD [facility] [place] Office: [phone] Pager: [phone]  11/30/2011, 8:51 AM

## 2011-11-30 NOTE — Op Note (Signed)
OPERATIVE NOTE   PROCEDURE: 1.  Right common femoral artery cannulation under ultrasound guidance 2.  Aortogram  PRE-OPERATIVE DIAGNOSIS: Possible right renal artery stenosis  POST-OPERATIVE DIAGNOSIS: same as above   SURGEON: Leonides Sake, MD  ANESTHESIA: conscious sedation  ESTIMATED BLOOD LOSS: 30 cc  CONTRAST: 80 cc  FINDING(S):  Aorta: widely patent, some evidence of calcification  Superior mesenteric artery: widely patent Celiac artery: widely patent   Right Left  RA Patent, 50% stenosis in main renal artery, small non-dominant renal artery 2 co-dominant renal arteries  CIA Patent Patent  EIA Patent Patent  IIA Patent Patent  CFA Patent Patent   SPECIMEN(S):  none  INDICATIONS:   Amanda Booker is a 61 y.o. female who presents with asymmetric kidneys, drug resistant hypertension, and right renal artery stenosis on CT.  The patient presents for: aortogram, possible renal angiogram, possible intervention.  I discussed with the patient the nature of angiographic procedures, especially the limited patencies of any endovascular intervention.  The patient is aware of that the risks of an angiographic procedure include but are not limited to: bleeding, infection, access site complications, renal failure, embolization, rupture of vessel, dissection, possible need for emergent surgical intervention, possible need for surgical procedures to treat the patient's pathology, and stroke and death.  The patient is aware of the risks and agrees to proceed.  DESCRIPTION: After full informed consent was obtained from the patient, the patient was brought back to the angiography suite.  The patient was placed supine upon the angiography table and connected to monitoring equipment.  The patient was then given conscious sedation, the amounts of which are documented in the patient's chart.  The patient was prepped and drape in the standard fashion for an angiographic procedure.  At this point,  attention was turned to the right groin. Under ultrasound guidance, the right common femoral artery will be cannulated with a 18 gauge needle.  The [facility] wire was passed up into the aorta.  The needle was exchanged for a 5-Fr sheath, which was advanced over the wire into the common femoral artery.  The dilator was then removed.   The Omniflush catheter was then loaded over the wire up to the level of L1.  The catheter was connected to the power injector circuit.  After de-airring and de-clotting the circuit, a power injector aortogram was completed.  I pulled the catheter down to the perirenal segment and did right and left oblique angiogram to evaluate the renal arteries.  There are to be a 50% stenosis in the right main renal artery with calcification of the orifice.  The co-dominant left renal arteries were widely open.  I replaced the wire in the catheter to straighten the crook of the catheter.  The wire and catheter were removed.  COMPLICATIONS: none  CONDITION: stable  Leonides Sake, MD [facility] [place] Office: [phone] Pager: [phone]  11/30/2011, 10:45 AM

## 2011-11-30 NOTE — H&P (View-Only) (Signed)
[facility] [place]  Referred by:  Josue Hector, MD [address] [place], [place] [address]  Reason for referral: renal artery stenosis  History of Present Illness  Amanda Booker is a 61 y.o. ([birth year]) female who presents with chief complaint: poorly controlled hypertension.  Patient recently underwent CTA abd/pelvis to evaluate the kidneys for possible RAS.  Her PCP is concerned with escalating need for BP rx and poor response to medication.  Her BP is routinely > 200 on her current two drug regimen.  She denies any changes in urinary habits.  He also has known renal stone and cyst in her kidney.  Past Medical History  Diagnosis Date  . Cerebral aneurysm rupture   . Hypertension   . Diabetes mellitus   . Cerebral ventricular shunt fitting or adjustment   . Hypokalemia   . Coagulopathy   . Chronic kidney disease     Past Surgical History  Procedure Date  . Abdominal hysterectomy 2007    History   Social History  . Marital Status: Married    Spouse Name: N/A    Number of Children: N/A  . Years of Education: N/A   Occupational History  . Not on file.   Social History Main Topics  . Smoking status: Current Everyday Smoker -- 0.5 packs/day for 22 years    Types: Cigarettes  . Smokeless tobacco: Never Used  . Alcohol Use: Yes     special occasions  . Drug Use: No  . Sexually Active: Not on file   Other Topics Concern  . Not on file   Social History Narrative  . No narrative on file    Family History  Problem Relation Age of Onset  . Cerebral aneurysm Sister   . Hypertension Sister   . Kidney failure Sister   . Cancer Mother     uterine  . Hyperlipidemia Mother   . Diabetes Brother   . Hypertension Brother   . Diabetes Brother   . Hypertension Brother     Current Outpatient Prescriptions on File Prior to Visit  Medication Sig Dispense Refill  . amLODipine (NORVASC) 5 MG tablet Take 1 tablet (5 mg total) by mouth daily.   30 tablet  1  . atorvastatin (LIPITOR) 20 MG tablet Take 20 mg by mouth daily.      . cloNIDine (CATAPRES) 0.2 MG tablet Take 2 mg by mouth daily.      Marland Kitchen glipiZIDE (GLUCOTROL) 10 MG tablet Take 10 mg by mouth 2 (two) times daily.      . quinapril (ACCUPRIL) 40 MG tablet Take 40 mg by mouth daily.      . sitaGLIPtin (JANUVIA) 100 MG tablet Take 100 mg by mouth daily.      Marland Kitchen atenolol (TENORMIN) 25 MG tablet Take 1 tablet (25 mg total) by mouth daily.  30 tablet  1  . magnesium oxide (MAG-OX) 400 MG tablet Take 1 tablet (400 mg total) by mouth daily.  14 tablet  0  . potassium chloride SA (K-DUR,KLOR-CON) 20 MEQ tablet Take 1 tablet (20 mEq total) by mouth daily.  15 tablet  0    No Known Allergies  Review of Systems (Positive items checked otherwise negative)  General: [ ]  Weight loss, [ ]  Weight gain, [ ]   Loss of appetite, [ ]  Fever  Neurologic: [x]  Numbness in arms or legs , [x]  Temporary loss of vision in one eye [ ]  Headaches, [ ]  Seizure  Ear/Nose/Throat: [ ]   Change in eyesight, [ ]  Change in hearing, [ ]  Nose bleeds, [ ]  Sore throat  Vascular: [ ]  Pain in legs with walking, [x]  Pain in feet while lying flat, [ ]  Non-healing ulcer, [ ]  Stroke, [ ]  "Mini stroke", [ ]  Slurred speech, [ ]  Temporary blindness, [ ]  Blood clot in vein, [ ]  Phlebitis  Pulmonary: [ ]  Home oxygen, [x]  Productive cough, [ ]  Bronchitis, [ ]  Coughing up blood, [ ]  Asthma, [x]  Wheezing  Musculoskeletal: [ ]  Arthritis, [ ]  Joint pain, [ ]  Muscle pain  Cardiac: [ ]  Chest pain, [ ]  Chest tightness/pressure, [ ]  Shortness of breath when lying flat, [ ]  Shortness of breath with exertion, [ ]  Palpitations, [ ]  Heart murmur, [ ]  Arrythmia, [ ]  Atrial fibrillation  Hematologic: [ ]  Bleeding problems, [ ]  Clotting disorder, [ ]  Anemia  Psychiatric:  [ ]  Depression, [ ]  Anxiety, [ ]  Attention deficit disorder  Gastrointestinal:  [ ]  Black stool,[ ]   Blood in stool, [ ]  Peptic ulcer disease, [ ]  Reflux, [ ]  Hiatal  hernia, [ ]  Trouble swallowing, [ ]  Diarrhea, [ ]  Constipation  Urinary:  [ ]  Kidney disease, [ ]  Burning with urination, [ ]  Frequent urination, [ ]  Difficulty urinating  Skin: [ ]  Ulcers, [ ]  Rashes   Physical Examination  Filed Vitals:   11/24/11 0904  BP: 165/73  Pulse: 66  Temp: 97.7 F (36.5 C)  TempSrc: Oral  Height: 5\' 7"  (1.702 m)  Weight: 189 lb (85.73 kg)  SpO2: 99%   Body mass index is 29.60 kg/(m^2).  General: A&O x 3, WDWN, mildly obese  Head: [place]/AT  Ear/Nose/Throat: Hearing grossly intact, nares w/o erythema or drainage, oropharynx w/o Erythema/Exudate  Eyes: PERRLA, EOMI  Neck: Supple, no nuchal rigidity, no palpable LAD  Pulmonary: Sym exp, good air movt, CTAB, no rales & rhonchi,  + wheezing  Cardiac: RRR, Nl S1, S2, no Murmurs, rubs or gallops  Vascular: Vessel Right Left  Radial Palpable Palpable  Brachial Palpable Palpable  Carotid Palpable, without bruit Palpable, without bruit  Aorta Non-palpable N/A  Femoral Palpable Palpable  Popliteal Non-palpable Non-palpable  PT Faintly Palpable Faintly Palpable  DP Faintly Palpable Faintly Palpable   Gastrointestinal: soft, NTND, -G/R, - HSM, - masses, - CVAT B  Musculoskeletal: M/S 5/5 throughout: left side slightly weaker , Extremities without ischemic changes   Neurologic: CN 2-12 intact , Pain and light touch intact in extremities , Motor exam as listed above  Psychiatric: Judgment intact, Mood & affect appropriate for pt's clinical situation  Dermatologic: See M/S exam for extremity exam, no rashes otherwise noted  Lymph : No Cervical, Axillary, or Inguinal lymphadenopathy   CTA Abd/Pelvis (08/09/11)  1. The right kidney is noted to be slightly atrophic in comparison to the contralateral left kidney. 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  2. Duplicated left-sided renal arteries without hemodynamically significant narrowing.  3. Moderate predominantly calcified atherosclerotic plaque within a nonaneurysmal abdominal aorta.  4. Multiple nonobstructing right-sided renal stones. Solitary nonobstructing left-sided renal stone. No urinary obstruction.  Based on the images I have available, this patient has calcific aorta without aneurysmal disease.  All mesenteric and renal vessel appear patent.  I cannot see the stenosis noted in the report.  Outside Studies/Documentation 10 pages of outside documents were reviewed including: clinic charts,  CT scan, and renal duplex report.  Medical Decision Making  Amanda Booker is a 61 y.o. female who presents with: poor controlled hypertension, possible R RAS.   In this case with asx kidneys, possible R renal artery stenosis and poorly controlled hypertension, it is reasonable to proceed with Aortogram, right renal artery angiogram with possible intervention. I discussed with the patient the nature of angiographic procedures, especially the limited patencies of any endovascular intervention.  The patient is aware of that the risks of an angiographic procedure include but are not limited to: bleeding, infection, access site complications, renal failure, embolization, rupture of vessel, dissection, possible need for emergent surgical intervention, possible need for surgical procedures to treat the patient's pathology, and stroke and death.   The patient is aware of the risks and agrees to proceed.  This procedure is scheduled for this Thursday, 6 AUG 13. I discussed with the patient and family, the need to hydrate the day prior to the procedure, the day of, and the day after in order to reduce the nephrotoxicity of the dye load.  I discussed in depth with the patient the nature of atherosclerosis, and emphasized the importance of maximal medical management  including strict control of blood pressure, blood glucose, and lipid levels, antiplatelet agents, obtaining regular exercise, and cessation of smoking.  The patient is aware that without maximal medical management the underlying atherosclerotic disease process will progress, limiting the benefit of any interventions.  Thank you for allowing [place] to participate in this patient's care.  Leonides Sake, MD [facility] [place] Office: [phone] Pager: [phone]  11/24/2011, 9:38 AM

## 2011-11-30 NOTE — Telephone Encounter (Addendum)
Message copied by Rosalyn Charters on Thu Nov 30, 2011  3:41 PM ------      Message from: Phillips Odor      Created: Thu Nov 30, 2011  2:53 PM                   ----- Message -----         From: Fransisco Hertz, MD         Sent: 11/30/2011  10:53 AM           To: Reuel Derby, Melene Plan, RN            Jalaysha Skilton      [id number]      [birth year]            PROCEDURE:      1.  Right common femoral artery cannulation under ultrasound guidance      2.  Aortogram            Follow-up: 4 weeks        L/v/m for patient notifying her of her fu appointment with dr. Imogene Burn on 01-05-12 3:30pm

## 2011-12-01 LAB — GLUCOSE, CAPILLARY: Glucose-Capillary: 243 mg/dL — ABNORMAL HIGH (ref 70–99)

## 2012-01-04 ENCOUNTER — Encounter: Payer: Self-pay | Admitting: Vascular Surgery

## 2012-01-05 ENCOUNTER — Encounter: Payer: Self-pay | Admitting: Vascular Surgery

## 2012-01-05 ENCOUNTER — Ambulatory Visit (INDEPENDENT_AMBULATORY_CARE_PROVIDER_SITE_OTHER): Payer: Medicare Other | Admitting: Vascular Surgery

## 2012-01-05 VITALS — BP 196/90 | HR 85 | Temp 97.8°F | Ht 67.0 in | Wt 188.0 lb

## 2012-01-05 DIAGNOSIS — I701 Atherosclerosis of renal artery: Secondary | ICD-10-CM

## 2012-01-05 DIAGNOSIS — Z48812 Encounter for surgical aftercare following surgery on the circulatory system: Secondary | ICD-10-CM

## 2012-01-05 NOTE — Addendum Note (Signed)
Addended by: Sharee Pimple on: 01/05/2012 03:58 PM   Modules accepted: Orders

## 2012-01-05 NOTE — Progress Notes (Signed)
[facility] [place]  Postoperative Visit  History of Present Illness  Amanda Booker is a 61 y.o. female who presents for postoperative follow-up from procedure on Date: 11/30/11: Aortogram for renal evaluation.  A right renal artery was found to be ~50% stenosis, co-dominant left renal arteries were widely patent.  The patient is able to complete their activities of daily living.  The patient's current symptoms are: none.  Patient notes escalation of blood pressure since starting aspirin.  Past Medical History, Past Surgical History, Social History, Family History, Medications, Allergies, and Review of Systems are unchanged from previous evaluation on 11/30/11.  Physical Examination  Filed Vitals:   01/05/12 1511  BP: 196/90  Pulse: 85  Temp: 97.8 F (36.6 C)  Height: 5\' 7"  (1.702 m)  Weight: 188 lb (85.276 kg)  SpO2: 98%   Body mass index is 29.44 kg/(m^2).  General: A&O x 3, WDWN  Pulmonary: Sym exp, good air movt, CTAB, no rales, rhonchi, & wheezing  Cardiac: RRR, Nl S1, S2, no Murmurs, rubs or gallops   Gastrointestinal: soft, NTND, -G/R, - HSM, - masses, - CVAT B  Musculoskeletal: M/S 5/5 throughout , Extremities without ischemic changes , R groin without hematoma, no echymosis present at cannulation site  Neurologic:  Pain and light touch intact in extremities , Motor exam as listed above  Medical Decision Making  Amanda Booker is a 61 y.o. female who presents s/p Aortogram for renal evaluation of R RAS Based on his angiographic findings, this patient needs: q3 mon B renal artery duplex. I doubt the addition of aspirin is the etiology of her escalating HTN.  This pt does have a R renal artery stenosis, but the benefit to risk ratio for a 50% stenosis does not justify the risk of right renal artery occlusion after stenting. If she continues to have escalation in blood pressure and renal size decrease, stenting the R renal artery may be  justified. I discussed in depth with the patient the nature of atherosclerosis, and emphasized the importance of maximal medical management including strict control of blood pressure, blood glucose, and lipid levels, obtaining regular exercise, and cessation of smoking.  The patient is aware that without maximal medical management the underlying atherosclerotic disease process will progress, limiting the benefit of any interventions.  Thank you for allowing [place] to participate in this patient's care.  Leonides Sake, MD [facility] [place] Office: [phone] Pager: [phone]

## 2012-04-04 ENCOUNTER — Encounter: Payer: Self-pay | Admitting: Vascular Surgery

## 2012-04-05 ENCOUNTER — Other Ambulatory Visit (INDEPENDENT_AMBULATORY_CARE_PROVIDER_SITE_OTHER): Payer: Medicare Other | Admitting: *Deleted

## 2012-04-05 ENCOUNTER — Ambulatory Visit (INDEPENDENT_AMBULATORY_CARE_PROVIDER_SITE_OTHER): Payer: Medicare Other | Admitting: Vascular Surgery

## 2012-04-05 ENCOUNTER — Encounter: Payer: Self-pay | Admitting: Vascular Surgery

## 2012-04-05 VITALS — BP 157/80 | HR 68 | Ht 67.0 in | Wt 191.0 lb

## 2012-04-05 DIAGNOSIS — I701 Atherosclerosis of renal artery: Secondary | ICD-10-CM

## 2012-04-05 DIAGNOSIS — Z48812 Encounter for surgical aftercare following surgery on the circulatory system: Secondary | ICD-10-CM

## 2012-04-05 NOTE — Addendum Note (Signed)
Addended by: Sharee Pimple on: 04/05/2012 03:39 PM   Modules accepted: Orders

## 2012-04-05 NOTE — Progress Notes (Signed)
[facility]   Established Renal Artery Stenosis  History of Present Illness  Amanda Booker is a 61 y.o. (04/16/1951) female who presents with chief complaint: follow up on renal artery stenosis.  Previous renal angiogram (11/30/11) reveals: R RA stenosis: 50% and L RA stenosis: 0% (co-dominant renal arteries)  The patient's blood pressure has improved.  The patient's blood pressure medication regimen has remained stable.  The patient's urinary history has remained stable.  Past Medical History, Past Surgical History, Social History, Family History, Medications, Allergies, and Review of Systems are unchanged from previous evaluation on 11/30/11.  Physical Examination  Filed Vitals:   04/05/12 1059  BP: 157/80  Pulse: 68  Height: 5\' 7"  (1.702 m)  Weight: 191 lb (86.637 kg)  SpO2: 100%   Body mass index is 29.91 kg/(m^2).  General: A&O x 3, WDWN  Eyes: PERRLA, EOMI  Pulmonary: Sym exp, good air movt, CTAB, no rales, rhonchi, & wheezing  Cardiac: RRR, Nl S1, S2, no Murmurs, rubs or gallops  Vascular: Vessel Right Left  Radial Palpable Palpable  Ulnar Palpable Palpable  Brachial Palpable Palpable  Carotid Palpable, without bruit Palpable, without bruit  Aorta Not palpable N/A  Femoral Palpable Palpable  Popliteal Not palpable Not palpable  PT Faintly Palpable Faintly Palpable  DP Faintly Palpable Faintly Palpable   Gastrointestinal: soft, NTND, -G/R, - HSM, - masses, - CVAT B  Musculoskeletal: M/S 5/5 throughout , Extremities without ischemic changes   Neurologic: Pain and light touch intact in extremities , Motor exam as listed above  Non-Invasive Vascular Imaging  Renal Duplex (Date: 04/05/12)  R kidney size: 9.3 cm  L kidney size: 12 cm  R RA stenosis: >60% (243 c/s)  L RA stenosis: >60% (257 c/s), tortuous  Medical Decision Making  Amanda Booker is a 61 y.o. female who presents with: R RAS   The renal duplex today  reflects the limitations of the test: co-dominant RA not visualized and inaccurate measurement due to tortuosity.    The right renal length is relatively unchanged (9.5 cm (outside study) vs 9.3 cm) so I doubt any hemodynamically significant stenosis.  Based on the patient's vascular studies and examination, I have offered the patient: q6 month renal duplex.  As I discussed with the patient previously, I feel the risk to benefit ratio in regards to stenting her right renal artery do not favor stenting, especially as she does not have evidence of renovascular hypertension.  I discussed in depth with the patient the nature of atherosclerosis, and emphasized the importance of maximal medical management including strict control of blood pressure, blood glucose, and lipid levels, antiplatelet agents, obtaining regular exercise, and cessation of smoking.  The patient is aware that without maximal medical management the underlying atherosclerotic disease process will progress, limiting the benefit of any interventions.  Thank you for allowing [place] to participate in this patient's care.  Leonides Sake, MD [facility] [place] Office: [phone] Pager: [phone]  04/05/2012, 11:30 AM

## 2012-10-03 ENCOUNTER — Encounter: Payer: Self-pay | Admitting: Vascular Surgery

## 2012-10-04 ENCOUNTER — Other Ambulatory Visit: Payer: Self-pay | Admitting: *Deleted

## 2012-10-04 ENCOUNTER — Encounter: Payer: Self-pay | Admitting: Vascular Surgery

## 2012-10-04 ENCOUNTER — Other Ambulatory Visit (INDEPENDENT_AMBULATORY_CARE_PROVIDER_SITE_OTHER): Payer: Medicare Other | Admitting: *Deleted

## 2012-10-04 ENCOUNTER — Ambulatory Visit (INDEPENDENT_AMBULATORY_CARE_PROVIDER_SITE_OTHER): Payer: Medicare Other | Admitting: Vascular Surgery

## 2012-10-04 VITALS — BP 176/96 | HR 69 | Ht 67.0 in | Wt 188.5 lb

## 2012-10-04 DIAGNOSIS — I701 Atherosclerosis of renal artery: Secondary | ICD-10-CM

## 2012-10-04 NOTE — Progress Notes (Signed)
[facility] [place]  Established Renal Artery Stenosis  History of Present Illness  Amanda Booker is a 62 y.o. (January 08, 1951) female who presents with chief complaint: follow up on renal artery stenosis.  Previous renal duplex completed on 10/04/12 reveals: R RA stenosis: <60% and L RA stenosis: >60%.  The patient's blood pressure has been titrated at this point and the regimen is now stable.  The patient's urinary history has remained stable.  On previous aortogram, it failed to demonstrate a >60% stenosis in either RA.  Past Medical History, Past Surgical History, Social History, Family History, Medications, Allergies, and Review of Systems are unchanged from previous evaluation on 01/05/12.  Physical Examination  Filed Vitals:   10/04/12 0951  BP: 176/96  Pulse: 69  Height: 5\' 7"  (1.702 m)  Weight: 188 lb 8 oz (85.503 kg)  SpO2: 100%   Body mass index is 29.52 kg/(m^2).  General: A&O x 3, WDWN  Eyes: PERRLA, EOMI  Pulmonary: Sym exp, good air movt, CTAB, no rales, rhonchi, & wheezing  Cardiac: RRR, Nl S1, S2, no Murmurs, rubs or gallops  Vascular: Vessel Right Left  Radial Palpable Palpable  Ulnar Palpable Palpable  Brachial Palpable Palpable  Carotid Palpable, without bruit Palpable, without bruit  Aorta Not palpable N/A  Femoral Palpable Palpable  Popliteal Not palpable Not palpable  PT Palpable Palpable  DP Palpable Palpable   Gastrointestinal: soft, NTND, -G/R, - HSM, - masses, - CVAT B  Musculoskeletal: M/S 5/5 throughout , Extremities without ischemic changes   Neurologic: Pain and light touch intact in extremities , Motor exam as listed above  Non-Invasive Vascular Imaging  Renal Duplex (Date: 10/04/12)  R kidney size: 9.5 cm  L kidney size: 13.7 cm  R RA stenosis: <60%  L RA stenosis: >60% (technical difficulties noted including tortuosity)  Medical Decision Making  Amanda Booker is a 62 y.o. female who presents with: non  hemodynamic significant B renal artery stenoses,  no evidence of renovascular hypertension  Based on the patient's vascular studies and examination, I have offered the patient: annual B renal artery duplex.  At this point, renal artery stent is considered very controversial.  CORAL suggests no significant advantage to renal artery stenting over maximal medical management.  I discussed in depth with the patient the nature of atherosclerosis, and emphasized the importance of maximal medical management including strict control of blood pressure, blood glucose, and lipid levels, antiplatelet agents, obtaining regular exercise, and cessation of smoking.  The patient is aware that without maximal medical management the underlying atherosclerotic disease process will progress, limiting the benefit of any interventions.  Thank you for allowing [place] to participate in this patient's care.  Leonides Sake, MD [facility] [place] Office: [phone] Pager: [phone]  10/04/2012, 3:44 PM

## 2012-12-26 ENCOUNTER — Other Ambulatory Visit (HOSPITAL_COMMUNITY): Payer: Self-pay | Admitting: Family Medicine

## 2012-12-26 DIAGNOSIS — Z139 Encounter for screening, unspecified: Secondary | ICD-10-CM

## 2013-01-27 ENCOUNTER — Ambulatory Visit (HOSPITAL_COMMUNITY)
Admission: RE | Admit: 2013-01-27 | Discharge: 2013-01-27 | Disposition: A | Discharge status: Home / Self Care | Payer: Medicare Other | Source: Ambulatory Visit | Attending: Family Medicine | Admitting: Family Medicine

## 2013-01-27 DIAGNOSIS — Z139 Encounter for screening, unspecified: Secondary | ICD-10-CM

## 2013-01-27 DIAGNOSIS — Z1231 Encounter for screening mammogram for malignant neoplasm of breast: Secondary | ICD-10-CM | POA: Insufficient documentation

## 2013-03-27 ENCOUNTER — Encounter (HOSPITAL_COMMUNITY): Payer: Self-pay | Admitting: Emergency Medicine

## 2013-03-27 ENCOUNTER — Inpatient Hospital Stay (HOSPITAL_COMMUNITY)
Admission: EM | Admit: 2013-03-27 | Discharge: 2013-03-30 | DRG: 190 | Disposition: A | Discharge status: Home / Self Care | Payer: Medicare Other | Attending: Internal Medicine | Admitting: Internal Medicine

## 2013-03-27 ENCOUNTER — Emergency Department (HOSPITAL_COMMUNITY): Payer: Medicare Other

## 2013-03-27 DIAGNOSIS — T380X5A Adverse effect of glucocorticoids and synthetic analogues, initial encounter: Secondary | ICD-10-CM | POA: Diagnosis present

## 2013-03-27 DIAGNOSIS — R0600 Dyspnea, unspecified: Secondary | ICD-10-CM

## 2013-03-27 DIAGNOSIS — F17200 Nicotine dependence, unspecified, uncomplicated: Secondary | ICD-10-CM | POA: Diagnosis present

## 2013-03-27 DIAGNOSIS — E876 Hypokalemia: Secondary | ICD-10-CM | POA: Diagnosis present

## 2013-03-27 DIAGNOSIS — J9600 Acute respiratory failure, unspecified whether with hypoxia or hypercapnia: Secondary | ICD-10-CM | POA: Diagnosis present

## 2013-03-27 DIAGNOSIS — IMO0001 Reserved for inherently not codable concepts without codable children: Secondary | ICD-10-CM | POA: Diagnosis present

## 2013-03-27 DIAGNOSIS — I701 Atherosclerosis of renal artery: Secondary | ICD-10-CM | POA: Diagnosis present

## 2013-03-27 DIAGNOSIS — E1169 Type 2 diabetes mellitus with other specified complication: Secondary | ICD-10-CM | POA: Diagnosis present

## 2013-03-27 DIAGNOSIS — E119 Type 2 diabetes mellitus without complications: Secondary | ICD-10-CM

## 2013-03-27 DIAGNOSIS — I10 Essential (primary) hypertension: Secondary | ICD-10-CM

## 2013-03-27 DIAGNOSIS — J441 Chronic obstructive pulmonary disease with (acute) exacerbation: Principal | ICD-10-CM | POA: Diagnosis present

## 2013-03-27 DIAGNOSIS — R0902 Hypoxemia: Secondary | ICD-10-CM

## 2013-03-27 DIAGNOSIS — I129 Hypertensive chronic kidney disease with stage 1 through stage 4 chronic kidney disease, or unspecified chronic kidney disease: Secondary | ICD-10-CM | POA: Diagnosis present

## 2013-03-27 DIAGNOSIS — Z982 Presence of cerebrospinal fluid drainage device: Secondary | ICD-10-CM

## 2013-03-27 DIAGNOSIS — J449 Chronic obstructive pulmonary disease, unspecified: Secondary | ICD-10-CM

## 2013-03-27 DIAGNOSIS — E1159 Type 2 diabetes mellitus with other circulatory complications: Secondary | ICD-10-CM | POA: Diagnosis present

## 2013-03-27 DIAGNOSIS — Z833 Family history of diabetes mellitus: Secondary | ICD-10-CM

## 2013-03-27 DIAGNOSIS — N189 Chronic kidney disease, unspecified: Secondary | ICD-10-CM | POA: Diagnosis present

## 2013-03-27 DIAGNOSIS — Z8249 Family history of ischemic heart disease and other diseases of the circulatory system: Secondary | ICD-10-CM

## 2013-03-27 DIAGNOSIS — Z720 Tobacco use: Secondary | ICD-10-CM | POA: Diagnosis present

## 2013-03-27 DIAGNOSIS — I160 Hypertensive urgency: Secondary | ICD-10-CM

## 2013-03-27 HISTORY — DX: Calculus of kidney: N20.0

## 2013-03-27 HISTORY — DX: Atherosclerosis of renal artery: I70.1

## 2013-03-27 LAB — URINALYSIS, ROUTINE W REFLEX MICROSCOPIC
Glucose, UA: NEGATIVE mg/dL
Ketones, ur: NEGATIVE mg/dL
Nitrite: NEGATIVE
Protein, ur: NEGATIVE mg/dL
Specific Gravity, Urine: >1.03 — ABNORMAL HIGH (ref 1.005–1.030)

## 2013-03-27 LAB — CBC WITH DIFFERENTIAL/PLATELET
Basophils Absolute: 0.1 10*3/uL (ref 0.0–0.1)
Basophils Relative: 1 % (ref 0–1)
Eosinophils Absolute: 0.2 10*3/uL (ref 0.0–0.7)
Eosinophils Relative: 2 % (ref 0–5)
HCT: 42.5 % (ref 36.0–46.0)
Hemoglobin: 14.6 g/dL (ref 12.0–15.0)
MCH: 30 pg (ref 26.0–34.0)
MCHC: 34.4 g/dL (ref 30.0–36.0)
MCV: 87.3 fL (ref 78.0–100.0)
Monocytes Absolute: 0.5 10*3/uL (ref 0.1–1.0)
RBC: 4.87 MIL/uL (ref 3.87–5.11)
RDW: 12.8 % (ref 11.5–15.5)

## 2013-03-27 LAB — BASIC METABOLIC PANEL
BUN: 17 mg/dL (ref 6–23)
CO2: 32 mEq/L (ref 19–32)
Chloride: 97 mEq/L (ref 96–112)
Creatinine, Ser: 0.66 mg/dL (ref 0.50–1.10)
GFR calc Af Amer: >90 mL/min (ref >90)

## 2013-03-27 LAB — URINE MICROSCOPIC-ADD ON

## 2013-03-27 MED ORDER — IPRATROPIUM BROMIDE 0.02 % IN SOLN
1.0000 mg | Freq: Once | RESPIRATORY_TRACT | Status: AC
  Administered 2013-03-27: 1 mg via RESPIRATORY_TRACT
  Filled 2013-03-27: qty 5

## 2013-03-27 MED ORDER — LEVOFLOXACIN IN D5W 500 MG/100ML IV SOLN
500.0000 mg | INTRAVENOUS | Status: DC
  Administered 2013-03-27 – 2013-03-29 (×3): 500 mg via INTRAVENOUS
  Filled 2013-03-27 (×4): qty 100

## 2013-03-27 MED ORDER — SODIUM CHLORIDE 0.9 % IJ SOLN
3.0000 mL | Freq: Two times a day (BID) | INTRAMUSCULAR | Status: DC
  Administered 2013-03-27 – 2013-03-28 (×2): 3 mL via INTRAVENOUS

## 2013-03-27 MED ORDER — POTASSIUM CHLORIDE CRYS ER 20 MEQ PO TBCR
40.0000 meq | EXTENDED_RELEASE_TABLET | Freq: Once | ORAL | Status: AC
  Administered 2013-03-27: 40 meq via ORAL
  Filled 2013-03-27: qty 2

## 2013-03-27 MED ORDER — GLIPIZIDE 5 MG PO TABS
10.0000 mg | ORAL_TABLET | Freq: Two times a day (BID) | ORAL | Status: DC
  Administered 2013-03-28 – 2013-03-30 (×5): 10 mg via ORAL
  Filled 2013-03-27 (×5): qty 2

## 2013-03-27 MED ORDER — INSULIN ASPART 100 UNIT/ML ~~LOC~~ SOLN
0.0000 [IU] | Freq: Three times a day (TID) | SUBCUTANEOUS | Status: DC
  Administered 2013-03-28: 20 [IU] via SUBCUTANEOUS
  Administered 2013-03-28: 15 [IU] via SUBCUTANEOUS
  Administered 2013-03-28: 11 [IU] via SUBCUTANEOUS
  Administered 2013-03-29: 15 [IU] via SUBCUTANEOUS
  Administered 2013-03-29 (×2): 11 [IU] via SUBCUTANEOUS
  Administered 2013-03-30: 15 [IU] via SUBCUTANEOUS

## 2013-03-27 MED ORDER — ACETAMINOPHEN 650 MG RE SUPP: 650.0000 mg | Freq: Four times a day (QID) | RECTAL | Status: DC | PRN

## 2013-03-27 MED ORDER — LEVOFLOXACIN IN D5W 500 MG/100ML IV SOLN
INTRAVENOUS | Status: AC
  Filled 2013-03-27: qty 100

## 2013-03-27 MED ORDER — HYDROCHLOROTHIAZIDE 25 MG PO TABS
25.0000 mg | ORAL_TABLET | Freq: Every morning | ORAL | Status: DC
  Administered 2013-03-28 – 2013-03-30 (×3): 25 mg via ORAL
  Filled 2013-03-27 (×3): qty 1

## 2013-03-27 MED ORDER — METHYLPREDNISOLONE SODIUM SUCC 125 MG IJ SOLR
60.0000 mg | Freq: Four times a day (QID) | INTRAMUSCULAR | Status: DC
  Administered 2013-03-28 (×2): 60 mg via INTRAVENOUS
  Filled 2013-03-27 (×3): qty 2

## 2013-03-27 MED ORDER — ATENOLOL 25 MG PO TABS
50.0000 mg | ORAL_TABLET | Freq: Two times a day (BID) | ORAL | Status: DC
  Administered 2013-03-27 – 2013-03-30 (×6): 50 mg via ORAL
  Filled 2013-03-27 (×6): qty 2

## 2013-03-27 MED ORDER — AMLODIPINE BESYLATE 5 MG PO TABS
10.0000 mg | ORAL_TABLET | Freq: Every morning | ORAL | Status: DC
  Administered 2013-03-28 – 2013-03-30 (×3): 10 mg via ORAL
  Filled 2013-03-27 (×3): qty 2

## 2013-03-27 MED ORDER — SODIUM CHLORIDE 0.9 % IJ SOLN
3.0000 mL | Freq: Two times a day (BID) | INTRAMUSCULAR | Status: DC
  Administered 2013-03-27 – 2013-03-30 (×5): 3 mL via INTRAVENOUS

## 2013-03-27 MED ORDER — ONDANSETRON HCL 4 MG PO TABS: 4.0000 mg | ORAL_TABLET | Freq: Four times a day (QID) | ORAL | Status: DC | PRN

## 2013-03-27 MED ORDER — ALBUTEROL SULFATE (5 MG/ML) 0.5% IN NEBU: 2.5000 mg | INHALATION_SOLUTION | RESPIRATORY_TRACT | Status: DC | PRN

## 2013-03-27 MED ORDER — ALBUTEROL SULFATE (5 MG/ML) 0.5% IN NEBU
2.5000 mg | INHALATION_SOLUTION | RESPIRATORY_TRACT | Status: DC
  Administered 2013-03-28 (×3): 2.5 mg via RESPIRATORY_TRACT

## 2013-03-27 MED ORDER — ALBUTEROL (5 MG/ML) CONTINUOUS INHALATION SOLN
10.0000 mg/h | INHALATION_SOLUTION | Freq: Once | RESPIRATORY_TRACT | Status: AC
  Administered 2013-03-27: 10 mg/h via RESPIRATORY_TRACT
  Filled 2013-03-27: qty 20

## 2013-03-27 MED ORDER — INSULIN ASPART 100 UNIT/ML ~~LOC~~ SOLN
0.0000 [IU] | Freq: Every day | SUBCUTANEOUS | Status: DC
  Administered 2013-03-27 – 2013-03-28 (×2): 4 [IU] via SUBCUTANEOUS
  Administered 2013-03-29: 5 [IU] via SUBCUTANEOUS

## 2013-03-27 MED ORDER — QUINAPRIL HCL 10 MG PO TABS: 40.0000 mg | ORAL_TABLET | Freq: Every morning | ORAL | Status: DC

## 2013-03-27 MED ORDER — LISINOPRIL 10 MG PO TABS
40.0000 mg | ORAL_TABLET | Freq: Every day | ORAL | Status: DC
  Administered 2013-03-28 – 2013-03-30 (×3): 40 mg via ORAL
  Filled 2013-03-27 (×3): qty 4

## 2013-03-27 MED ORDER — HYDROCODONE-ACETAMINOPHEN 5-325 MG PO TABS
1.0000 | ORAL_TABLET | ORAL | Status: DC | PRN
  Administered 2013-03-28: 2 via ORAL
  Filled 2013-03-27: qty 2

## 2013-03-27 MED ORDER — ATORVASTATIN CALCIUM 40 MG PO TABS
40.0000 mg | ORAL_TABLET | Freq: Every day | ORAL | Status: DC
  Administered 2013-03-28 – 2013-03-29 (×2): 40 mg via ORAL
  Filled 2013-03-27 (×2): qty 1

## 2013-03-27 MED ORDER — DOCUSATE SODIUM 100 MG PO CAPS
100.0000 mg | ORAL_CAPSULE | Freq: Two times a day (BID) | ORAL | Status: DC
  Administered 2013-03-27 – 2013-03-30 (×6): 100 mg via ORAL
  Filled 2013-03-27 (×6): qty 1

## 2013-03-27 MED ORDER — SODIUM CHLORIDE 0.9 % IV SOLN
INTRAVENOUS | Status: DC
  Administered 2013-03-27: 18:00:00 via INTRAVENOUS

## 2013-03-27 MED ORDER — CLONIDINE HCL 0.1 MG PO TABS
0.1000 mg | ORAL_TABLET | Freq: Two times a day (BID) | ORAL | Status: DC
  Administered 2013-03-28 – 2013-03-30 (×5): 0.1 mg via ORAL
  Filled 2013-03-27 (×6): qty 1

## 2013-03-27 MED ORDER — ACETAMINOPHEN 325 MG PO TABS: 650.0000 mg | ORAL_TABLET | Freq: Four times a day (QID) | ORAL | Status: DC | PRN

## 2013-03-27 MED ORDER — ENOXAPARIN SODIUM 40 MG/0.4ML ~~LOC~~ SOLN
40.0000 mg | SUBCUTANEOUS | Status: DC
  Administered 2013-03-27 – 2013-03-29 (×3): 40 mg via SUBCUTANEOUS
  Filled 2013-03-27 (×3): qty 0.4

## 2013-03-27 MED ORDER — IPRATROPIUM BROMIDE 0.02 % IN SOLN
0.5000 mg | RESPIRATORY_TRACT | Status: DC
  Administered 2013-03-28 (×3): 0.5 mg via RESPIRATORY_TRACT
  Filled 2013-03-27 (×3): qty 2.5

## 2013-03-27 MED ORDER — SODIUM CHLORIDE 0.9 % IV SOLN: 250.0000 mL | INTRAVENOUS | Status: DC | PRN

## 2013-03-27 MED ORDER — NICOTINE 14 MG/24HR TD PT24
14.0000 mg | MEDICATED_PATCH | Freq: Every day | TRANSDERMAL | Status: DC
  Administered 2013-03-27 – 2013-03-30 (×4): 14 mg via TRANSDERMAL
  Filled 2013-03-27 (×4): qty 1

## 2013-03-27 MED ORDER — METHYLPREDNISOLONE SODIUM SUCC 125 MG IJ SOLR
125.0000 mg | Freq: Once | INTRAMUSCULAR | Status: AC
  Administered 2013-03-27: 125 mg via INTRAVENOUS
  Filled 2013-03-27: qty 2

## 2013-03-27 MED ORDER — ONDANSETRON HCL 4 MG/2ML IJ SOLN: 4.0000 mg | Freq: Four times a day (QID) | INTRAMUSCULAR | Status: DC | PRN

## 2013-03-27 MED ORDER — SODIUM CHLORIDE 0.9 % IJ SOLN: 3.0000 mL | INTRAMUSCULAR | Status: DC | PRN

## 2013-03-27 MED ORDER — ASPIRIN 81 MG PO CHEW
81.0000 mg | CHEWABLE_TABLET | Freq: Every morning | ORAL | Status: DC
  Administered 2013-03-28 – 2013-03-30 (×3): 81 mg via ORAL
  Filled 2013-03-27 (×3): qty 1

## 2013-03-27 NOTE — H&P (Addendum)
[facility] History and Physical  Amanda Booker RUE:[id number] DOB: [birth year] DOA: 03/27/2013   PCP: Josue Hector, MD  Specialists: None  Chief Complaint: Shortness of breath for the last 3 days  HPI: Amanda Booker is a 62 y.o. female with a past medical history o, diabetes, hypertension, history of cerebral aneurysm in 1996 resulting in a stroke, history of kidney stone, who was in her usual state of health about 3 days ago, when she started having wheezing and shortness of breath. This progressively worsened. She started having a cough with greenish expectoration. Denies any blood in the sputum. No fever. No chills. Denies any sick contacts. No recent travel. No chest pain. No nausea, vomiting, no leg swelling. Since her symptoms were getting worse she decided to come in to the hospital. She denies taking any medications at home for her shortness of breath and wheezing.  Home Medications: Prior to Admission medications   Medication Sig Start Date End Date Taking? Authorizing Provider  amLODipine (NORVASC) 10 MG tablet Take 10 mg by mouth every morning.  12/24/11  Yes Historical Provider, MD  aspirin 81 MG tablet Take 81 mg by mouth every morning.    Yes Historical Provider, MD  atenolol (TENORMIN) 50 MG tablet Take 50 mg by mouth 2 (two) times daily.  12/24/11  Yes Historical Provider, MD  atorvastatin (LIPITOR) 40 MG tablet Take 40 mg by mouth every morning.   Yes Historical Provider, MD  cloNIDine (CATAPRES) 0.1 MG tablet 0.1 mg 2 (two) times daily.  03/26/12  Yes Historical Provider, MD  glipiZIDE (GLUCOTROL) 10 MG tablet Take 10 mg by mouth 2 (two) times daily.   Yes Historical Provider, MD  hydrochlorothiazide (HYDRODIURIL) 25 MG tablet Take 25 mg by mouth every morning.   Yes Historical Provider, MD  quinapril (ACCUPRIL) 40 MG tablet Take 40 mg by mouth every morning.  12/24/11  Yes Historical Provider, MD  sitaGLIPtin-metformin (JANUMET) 50-500 MG per tablet Take 1  tablet by mouth 2 (two) times daily.   Yes Historical Provider, MD    Allergies: No Known Allergies  Past Medical History: Past Medical History  Diagnosis Date  . Hypertension   . Diabetes mellitus   . Cerebral ventricular shunt fitting or adjustment   . Hypokalemia   . Coagulopathy   . Chronic kidney disease   . Kidney stone   . Renal artery stenosis   . Cerebral aneurysm rupture     Past Surgical History  Procedure Laterality Date  . Abdominal hysterectomy  2007  . Cerebral shunt      Social History: She lives in [place] with her husband. Smokes half pack of cigarettes on a daily basis. No alcohol use. No illicit drug use. Uses a cane to ambulate.  Family History:  Family History  Problem Relation Age of Onset  . Cerebral aneurysm Sister   . Hypertension Sister   . Kidney failure Sister   . Cancer Mother     uterine  . Hyperlipidemia Mother   . Diabetes Brother   . Hypertension Brother   . Diabetes Brother   . Hypertension Brother      Review of Systems - History obtained from the patient General ROS: positive for  - fatigue Psychological ROS: negative Ophthalmic ROS: negative ENT ROS: negative Allergy and Immunology ROS: negative Hematological and Lymphatic ROS: negative Endocrine ROS: negative Respiratory ROS: as in hpi Cardiovascular ROS: no chest pain or dyspnea on exertion Gastrointestinal ROS: no abdominal pain, change in bowel  habits, or black or bloody stools Genito-Urinary ROS: no dysuria, trouble voiding, or hematuria Musculoskeletal ROS: negative Neurological ROS: no TIA or stroke symptoms Dermatological ROS: negative  Physical Examination  Filed Vitals:   03/27/13 1928 03/27/13 1939 03/27/13 2000 03/27/13 2030  BP: 166/67  176/150 149/73  Pulse: 100 106 107 107  Temp:      TempSrc:      Resp: 20 23 21 26   Height:      Weight:      SpO2: 90% 94% 96% 95%    General appearance: alert, cooperative, appears stated age and no  distress Head: Normocephalic, without obvious abnormality, atraumatic Eyes: conjunctivae/corneas clear. PERRL, EOM's intact.  Throat: lips, mucosa, and tongue normal; teeth and gums normal Neck: no adenopathy, no carotid bruit, no JVD, supple, symmetrical, trachea midline and thyroid not enlarged, symmetric, no tenderness/mass/nodules Resp: Diffuse end expiratory wheezing bilaterally. No definite crackles. Cardio: regular rate and rhythm, S1, S2 normal, no murmur, click, rub or gallop GI: soft, non-tender; bowel sounds normal; no masses,  no organomegaly Extremities: extremities normal, atraumatic, no cyanosis or edema Pulses: 2+ and symmetric Skin: Skin color, texture, turgor normal. No rashes or lesions Lymph nodes: Cervical, supraclavicular, and axillary nodes normal. Neurologic: Alert and oriented x3. No focal neurological deficits were present.  Laboratory Data: Results for orders placed during the hospital encounter of 03/27/13 (from the past 48 hour(s))  BASIC METABOLIC PANEL     Status: Abnormal   Collection Time    03/27/13  6:00 PM      Result Value Range   Sodium 139  135 - 145 mEq/L   Potassium 3.1 (*) 3.5 - 5.1 mEq/L   Chloride 97  96 - 112 mEq/L   CO2 32  19 - 32 mEq/L   Glucose, Bld 231 (*) 70 - 99 mg/dL   BUN 17  6 - 23 mg/dL   Creatinine, Ser 1.61  0.50 - 1.10 mg/dL   Calcium 09.6  8.4 - 04.5 mg/dL   GFR calc non Af Amer >90  >90 mL/min   GFR calc Af Amer >90  >90 mL/min   Comment: (NOTE)     The eGFR has been calculated using the CKD EPI equation.     This calculation has not been validated in all clinical situations.     eGFR's persistently <90 mL/min signify possible Chronic Kidney     Disease.  CBC WITH DIFFERENTIAL     Status: None   Collection Time    03/27/13  6:00 PM      Result Value Range   WBC 8.9  4.0 - 10.5 K/uL   RBC 4.87  3.87 - 5.11 MIL/uL   Hemoglobin 14.6  12.0 - 15.0 g/dL   HCT 40.9  81.1 - 91.4 %   MCV 87.3  78.0 - 100.0 fL   MCH 30.0   26.0 - 34.0 pg   MCHC 34.4  30.0 - 36.0 g/dL   RDW 78.2  95.6 - 21.3 %   Platelets 354  150 - 400 K/uL   Neutrophils Relative % 63  43 - 77 %   Neutro Abs 5.6  1.7 - 7.7 K/uL   Lymphocytes Relative 29  12 - 46 %   Lymphs Abs 2.6  0.7 - 4.0 K/uL   Monocytes Relative 5  3 - 12 %   Monocytes Absolute 0.5  0.1 - 1.0 K/uL   Eosinophils Relative 2  0 - 5 %   Eosinophils Absolute 0.2  0.0 - 0.7 K/uL   Basophils Relative 1  0 - 1 %   Basophils Absolute 0.1  0.0 - 0.1 K/uL  TROPONIN I     Status: None   Collection Time    03/27/13  6:00 PM      Result Value Range   Troponin I <0.30  <0.30 ng/mL   Comment:            Due to the release kinetics of cTnI,     a negative result within the first hours     of the onset of symptoms does not rule out     myocardial infarction with certainty.     If myocardial infarction is still suspected,     repeat the test at appropriate intervals.  PRO B NATRIURETIC PEPTIDE     Status: None   Collection Time    03/27/13  6:00 PM      Result Value Range   Pro B Natriuretic peptide (BNP) 34.0  0 - 125 pg/mL  URINALYSIS, ROUTINE W REFLEX MICROSCOPIC     Status: Abnormal   Collection Time    03/27/13  6:40 PM      Result Value Range   Color, Urine YELLOW  YELLOW   APPearance CLEAR  CLEAR   Specific Gravity, Urine >1.030 (*) 1.005 - 1.030   pH 5.5  5.0 - 8.0   Glucose, UA NEGATIVE  NEGATIVE mg/dL   Hgb urine dipstick SMALL (*) NEGATIVE   Bilirubin Urine NEGATIVE  NEGATIVE   Ketones, ur NEGATIVE  NEGATIVE mg/dL   Protein, ur NEGATIVE  NEGATIVE mg/dL   Urobilinogen, UA 0.2  0.0 - 1.0 mg/dL   Nitrite NEGATIVE  NEGATIVE   Leukocytes, UA NEGATIVE  NEGATIVE  URINE MICROSCOPIC-ADD ON     Status: Abnormal   Collection Time    03/27/13  6:40 PM      Result Value Range   Squamous Epithelial / LPF RARE  RARE   WBC, UA 3-6  <3 WBC/hpf   RBC / HPF 3-6  <3 RBC/hpf   Bacteria, UA FEW (*) RARE    Radiology Reports: Dg Chest 2 View  03/27/2013   CLINICAL  DATA:  Cough and shortness of breath for several days. History of hypertension, diabetes. Cerebral shunt, chronic kidney disease, hypo cava removed, and smoking.  EXAM: CHEST  2 VIEW  COMPARISON:  06/30/2011  FINDINGS: Patient has right-sided ventriculoperitoneal shunt. Heart size is normal. The lungs are free of focal consolidations and pleural effusions. No pulmonary edema. Visualized osseous structures have a normal appearance.  IMPRESSION: No active cardiopulmonary disease.   Electronically Signed   By: Rosalie Gums M.D.   On: 03/27/2013 17:35    Electrocardiogram: Sinus rhythm at 87 beats per minute. Normal axis. Intervals are normal. No Q waves. No concerning ST or T-wave changes are noted. LVH is present.  Problem List  Principal Problem:   COPD with acute exacerbation Active Problems:   Type II or unspecified type diabetes mellitus without mention of complication, not stated as uncontrolled   Tobacco abuse   Hypoxia   HTN (hypertension), benign   Assessment: This is a 62 year old, African American female, with a past medical history as stated earlier, who presents with shortness of breath for the last 3 days. She has diffuse wheezing bilaterally. This is most likely COPD exacerbation.  Plan: #1 acute COPD exacerbation with hypoxia: She'll be treated with nebulizer treatments, steroids, and antibiotics. Smoking cessation counseling will be provided.  Nicotine patch will be utilized. She may benefit from being discharged on long acting beta agonist with inhaled steroid. She will require pulmonary function tests as an outpatient. Oxygen will be utilized for now  #2 history of type 2 diabetes: She will continued on her glipizide. We will give her sliding scale coverage. HbA1c will be checked. Glucose levels will be elevated due to steroid use. May require basal insulin as well.  #3 history of hypertension: Continue with her antihypertensive regimen.  #4 hypokalemia: Potassium will be  repleted.  #5 history of cerebral aneurysm status post repair at [facility] in 1996. She had left-sided deficits at that time. This issue appears to be stable. Continue to monitor.  DVT Prophylaxis: Lovenox Code Status: Full code Family Communication: Discussed with the patient and her husband  Disposition Plan: Admit to telemetry   Further management decisions will depend on results of further testing and patient's response to treatment.  [facility]  [facility] Pager [phone]  If 7PM-7AM, please contact night-coverage [email or web address] Password [facility]  03/27/2013, 9:46 PM

## 2013-03-27 NOTE — ED Notes (Signed)
Sob , Sent here from Dr Deitra Mayo office.  Cough, green sputum.   Office says 02 sats were 90%.  No fever

## 2013-03-27 NOTE — ED Provider Notes (Signed)
CSN: [id number]     Arrival date & time 03/27/13  1630 History   First MD Initiated Contact with Patient 03/27/13 1738     Chief Complaint  Patient presents with  . Shortness of Breath    HPI Pt was seen at 1745.  Per pt, c/o gradual onset and worsening of persistent cough, wheezing and SOB for the past 3 days. Describes the cough as productive of "green" sputum.  Pt was evaluated by her PMD today, told her "oxygen level was low," had a short neb treatment in the office without improvement in her symptoms, then was sent to the ED for further evaluation and admission. Denies CP/palpitations, no back pain, no abd pain, no N/V/D, no fevers, no rash.     Past Medical History  Diagnosis Date  . Hypertension   . Diabetes mellitus   . Cerebral ventricular shunt fitting or adjustment   . Hypokalemia   . Coagulopathy   . Chronic kidney disease   . Kidney stone   . Renal artery stenosis   . Cerebral aneurysm rupture    Past Surgical History  Procedure Laterality Date  . Abdominal hysterectomy  2007  . Cerebral shunt     Family History  Problem Relation Age of Onset  . Cerebral aneurysm Sister   . Hypertension Sister   . Kidney failure Sister   . Cancer Mother     uterine  . Hyperlipidemia Mother   . Diabetes Brother   . Hypertension Brother   . Diabetes Brother   . Hypertension Brother    History  Substance Use Topics  . Smoking status: Current Every Day Smoker -- 0.50 packs/day for 22 years    Types: Cigarettes  . Smokeless tobacco: Never Used  . Alcohol Use: No    Review of Systems ROS: Statement: All systems negative except as marked or noted in the HPI; Constitutional: Negative for fever and chills. ; ; Eyes: Negative for eye pain, redness and discharge. ; ; ENMT: Negative for ear pain, hoarseness, nasal congestion, sinus pressure and sore throat. ; ; Cardiovascular: Negative for chest pain, palpitations, diaphoresis and peripheral edema. ; ; Respiratory: +SOB, cough,  wheezing. Negative for stridor. ; ; Gastrointestinal: Negative for nausea, vomiting, diarrhea, abdominal pain, blood in stool, hematemesis, jaundice and rectal bleeding. . ; ; Genitourinary: Negative for dysuria, flank pain and hematuria. ; ; Musculoskeletal: Negative for back pain and neck pain. Negative for swelling and trauma.; ; Skin: Negative for pruritus, rash, abrasions, blisters, bruising and skin lesion.; ; Neuro: Negative for headache, lightheadedness and neck stiffness. Negative for weakness, altered level of consciousness , altered mental status, extremity weakness, paresthesias, involuntary movement, seizure and syncope.       Allergies  Review of patient's allergies indicates no known allergies.  Home Medications   Current Outpatient Rx  Name  Route  Sig  Dispense  Refill  . amLODipine (NORVASC) 10 MG tablet               . EXPIRED: amLODipine (NORVASC) 5 MG tablet   Oral   Take 10 mg by mouth daily.          Marland Kitchen aspirin 81 MG tablet   Oral   Take 81 mg by mouth daily.         Marland Kitchen EXPIRED: atenolol (TENORMIN) 25 MG tablet   Oral   Take 50 mg by mouth daily.          Marland Kitchen atenolol (TENORMIN) 50 MG tablet               .  atorvastatin (LIPITOR) 20 MG tablet   Oral   Take 20 mg by mouth daily.         . Ciprofloxacin (CIPRO PO)   Oral   Take by mouth 2 (two) times daily.         . cloNIDine (CATAPRES) 0.1 MG tablet               . cloNIDine (CATAPRES) 0.2 MG tablet   Oral   Take 0.2 mg by mouth 2 (two) times daily.          Marland Kitchen glipiZIDE (GLUCOTROL) 10 MG tablet   Oral   Take 10 mg by mouth 2 (two) times daily.         Marland Kitchen HYDROcodone-acetaminophen (VICODIN) 5-500 MG per tablet   Oral   Take 1 tablet by mouth every 6 (six) hours as needed. For pain         . metFORMIN (GLUCOPHAGE-XR) 500 MG 24 hr tablet   Oral   Take 1 tablet by mouth 2 (two) times daily.         . quinapril (ACCUPRIL) 40 MG tablet   Oral   Take 40 mg by mouth  daily.         . sitaGLIPtin (JANUVIA) 100 MG tablet   Oral   Take 100 mg by mouth daily.          BP 166/67  Pulse 106  Temp(Src) 98.2 F (36.8 C) (Oral)  Resp 23  Ht 5\' 7"  (1.702 m)  Wt 188 lb (85.276 kg)  BMI 29.44 kg/m2  SpO2 94% Physical Exam 1750: Physical examination:  Nursing notes reviewed; Vital signs and O2 SAT reviewed;  Constitutional: Well developed, Well nourished, Uncomfortable appearing; Head:  Normocephalic, atraumatic; Eyes: EOMI, PERRL, No scleral icterus; ENMT: Mouth and pharynx normal, Mucous membranes dry; Neck: Supple, Full range of motion, No lymphadenopathy; Cardiovascular: Regular rate and rhythm, No gallop; Respiratory: Breath sounds diminished & equal bilaterally, insp/exp wheezes bilat with occasional audible wheezes. Tachypenic, speaking in phrases. Sitting upright.; Chest: Nontender, Movement normal; Abdomen: Soft, Nontender, Nondistended, Normal bowel sounds; Genitourinary: No CVA tenderness; Extremities: Pulses normal, No tenderness, No edema, No calf edema or asymmetry.; Neuro: AA&Ox3, Major CN grossly intact.  Speech clear. No gross focal motor or sensory deficits in extremities.; Skin: Color normal, Warm, Dry.   ED Course  Procedures   EKG Interpretation    Date/Time:  Thursday March 27 2013 18:18:10 EST Ventricular Rate:  87 PR Interval:  186 QRS Duration: 90 QT Interval:  390 QTC Calculation: 469 R Axis:   3 Text Interpretation:  Normal sinus rhythm Left ventricular hypertrophy with repolarization abnormality Abnormal ECG When compared with ECG of 30-Jun-2011 20:29, No significant change was found Confirmed by [facility]  MD, Nicholos Johns [phone]) on 03/27/2013 6:31:49 PM            MDM  MDM Reviewed: previous chart, nursing note and vitals Reviewed previous: labs and ECG Interpretation: labs, ECG and x-ray Total time providing critical care: 30-74 minutes. This excludes time spent performing separately reportable procedures and  services. Consults: admitting MD     CRITICAL CARE Performed by: Laray Anger Total critical care time: 40 Critical care time was exclusive of separately billable procedures and treating other patients. Critical care was necessary to treat or prevent imminent or life-threatening deterioration. Critical care was time spent personally by me on the following activities: development of treatment plan with patient and/or surrogate as well as nursing, discussions with consultants,  evaluation of patient's response to treatment, examination of patient, obtaining history from patient or surrogate, ordering and performing treatments and interventions, ordering and review of laboratory studies, ordering and review of radiographic studies, pulse oximetry and re-evaluation of patient's condition.  Results for orders placed during the hospital encounter of 03/27/13  BASIC METABOLIC PANEL      Result Value Range   Sodium 139  135 - 145 mEq/L   Potassium 3.1 (*) 3.5 - 5.1 mEq/L   Chloride 97  96 - 112 mEq/L   CO2 32  19 - 32 mEq/L   Glucose, Bld 231 (*) 70 - 99 mg/dL   BUN 17  6 - 23 mg/dL   Creatinine, Ser 4.54  0.50 - 1.10 mg/dL   Calcium 09.8  8.4 - 11.9 mg/dL   GFR calc non Af Amer >90  >90 mL/min   GFR calc Af Amer >90  >90 mL/min  CBC WITH DIFFERENTIAL      Result Value Range   WBC 8.9  4.0 - 10.5 K/uL   RBC 4.87  3.87 - 5.11 MIL/uL   Hemoglobin 14.6  12.0 - 15.0 g/dL   HCT 14.7  82.9 - 56.2 %   MCV 87.3  78.0 - 100.0 fL   MCH 30.0  26.0 - 34.0 pg   MCHC 34.4  30.0 - 36.0 g/dL   RDW 13.0  86.5 - 78.4 %   Platelets 354  150 - 400 K/uL   Neutrophils Relative % 63  43 - 77 %   Neutro Abs 5.6  1.7 - 7.7 K/uL   Lymphocytes Relative 29  12 - 46 %   Lymphs Abs 2.6  0.7 - 4.0 K/uL   Monocytes Relative 5  3 - 12 %   Monocytes Absolute 0.5  0.1 - 1.0 K/uL   Eosinophils Relative 2  0 - 5 %   Eosinophils Absolute 0.2  0.0 - 0.7 K/uL   Basophils Relative 1  0 - 1 %   Basophils Absolute  0.1  0.0 - 0.1 K/uL  TROPONIN I      Result Value Range   Troponin I <0.30  <0.30 ng/mL  PRO B NATRIURETIC PEPTIDE      Result Value Range   Pro B Natriuretic peptide (BNP) 34.0  0 - 125 pg/mL  URINALYSIS, ROUTINE W REFLEX MICROSCOPIC      Result Value Range   Color, Urine YELLOW  YELLOW   APPearance CLEAR  CLEAR   Specific Gravity, Urine >1.030 (*) 1.005 - 1.030   pH 5.5  5.0 - 8.0   Glucose, UA NEGATIVE  NEGATIVE mg/dL   Hgb urine dipstick SMALL (*) NEGATIVE   Bilirubin Urine NEGATIVE  NEGATIVE   Ketones, ur NEGATIVE  NEGATIVE mg/dL   Protein, ur NEGATIVE  NEGATIVE mg/dL   Urobilinogen, UA 0.2  0.0 - 1.0 mg/dL   Nitrite NEGATIVE  NEGATIVE   Leukocytes, UA NEGATIVE  NEGATIVE  URINE MICROSCOPIC-ADD ON      Result Value Range   Squamous Epithelial / LPF RARE  RARE   WBC, UA 3-6  <3 WBC/hpf   RBC / HPF 3-6  <3 RBC/hpf   Bacteria, UA FEW (*) RARE   Dg Chest 2 View 03/27/2013   CLINICAL DATA:  Cough and shortness of breath for several days. History of hypertension, diabetes. Cerebral shunt, chronic kidney disease, hypo cava removed, and smoking.  EXAM: CHEST  2 VIEW  COMPARISON:  06/30/2011  FINDINGS: Patient has right-sided ventriculoperitoneal  shunt. Heart size is normal. The lungs are free of focal consolidations and pleural effusions. No pulmonary edema. Visualized osseous structures have a normal appearance.  IMPRESSION: No active cardiopulmonary disease.   Electronically Signed   By: Rosalie Gums M.D.   On: 03/27/2013 17:35    1755: Pt sitting upright, tachypneic and wheezing on arrival, Sats 86% R/A: will dose IV steroid and start hour long neb.   1920:   Hour long neb completed. Pt talking in sentences with family at bedside, less tachypneic, lungs coarse bilat with scattered exp wheezes, Sats dropping to 86% R/A. O2 2L N/C applied with Sats increasing to 94%. Pt long hx tobacco use; likely COPD exacerbation. Dx and testing d/w pt and family.  Questions answered.  Verb  understanding, agreeable to admit. T/C to [facility]. Rito Ehrlich, case discussed, including:  HPI, pertinent PM/SHx, VS/PE, dx testing, ED course and treatment:  Agreeable to admit, requests to write temporary orders, obtain tele bed to team 1.   Laray Anger, DO 03/29/13 1511

## 2013-03-27 NOTE — ED Notes (Addendum)
Patient's daughter came to me and stated that she gave her mother her night time medications because her blood pressure was going up. Stated that she gave her Clonidine 0.1 mg, Atenolol 50 mg, and Glipizide 10 mg. The only night time med she did not give her was Janumet because they were out of it. Dr. Clarene Duke notified.

## 2013-03-28 DIAGNOSIS — R0902 Hypoxemia: Secondary | ICD-10-CM

## 2013-03-28 LAB — HEMOGLOBIN A1C
Hgb A1c MFr Bld: 7.9 % — ABNORMAL HIGH (ref <5.7)
Mean Plasma Glucose: 180 mg/dL — ABNORMAL HIGH (ref <117)

## 2013-03-28 LAB — COMPREHENSIVE METABOLIC PANEL
ALT: 23 U/L (ref 0–35)
Alkaline Phosphatase: 72 U/L (ref 39–117)
BUN: 18 mg/dL (ref 6–23)
CO2: 30 mEq/L (ref 19–32)
Chloride: 95 mEq/L — ABNORMAL LOW (ref 96–112)
Creatinine, Ser: 0.59 mg/dL (ref 0.50–1.10)
GFR calc Af Amer: >90 mL/min (ref >90)
GFR calc non Af Amer: >90 mL/min (ref >90)
Glucose, Bld: 284 mg/dL — ABNORMAL HIGH (ref 70–99)
Potassium: 3.8 mEq/L (ref 3.5–5.1)
Sodium: 137 mEq/L (ref 135–145)
Total Bilirubin: 0.4 mg/dL (ref 0.3–1.2)
Total Protein: 8 g/dL (ref 6.0–8.3)

## 2013-03-28 LAB — GLUCOSE, CAPILLARY
Glucose-Capillary: 359 mg/dL — ABNORMAL HIGH (ref 70–99)
Glucose-Capillary: 306 mg/dL — ABNORMAL HIGH (ref 70–99)
Glucose-Capillary: 313 mg/dL — ABNORMAL HIGH (ref 70–99)

## 2013-03-28 LAB — CBC
Platelets: 359 10*3/uL (ref 150–400)
RDW: 13 % (ref 11.5–15.5)
WBC: 8.6 10*3/uL (ref 4.0–10.5)

## 2013-03-28 MED ORDER — IPRATROPIUM BROMIDE 0.02 % IN SOLN
0.5000 mg | Freq: Four times a day (QID) | RESPIRATORY_TRACT | Status: DC
  Administered 2013-03-28 – 2013-03-30 (×6): 0.5 mg via RESPIRATORY_TRACT
  Filled 2013-03-28 (×6): qty 2.5

## 2013-03-28 MED ORDER — METHYLPREDNISOLONE SODIUM SUCC 40 MG IJ SOLR
40.0000 mg | Freq: Three times a day (TID) | INTRAMUSCULAR | Status: DC
  Administered 2013-03-28 – 2013-03-29 (×4): 40 mg via INTRAVENOUS
  Filled 2013-03-28 (×4): qty 1

## 2013-03-28 MED ORDER — ALBUTEROL SULFATE (5 MG/ML) 0.5% IN NEBU
2.5000 mg | INHALATION_SOLUTION | Freq: Four times a day (QID) | RESPIRATORY_TRACT | Status: DC
  Administered 2013-03-28 – 2013-03-30 (×6): 2.5 mg via RESPIRATORY_TRACT
  Filled 2013-03-28 (×6): qty 0.5

## 2013-03-28 MED ORDER — INSULIN GLARGINE 100 UNIT/ML ~~LOC~~ SOLN
8.0000 [IU] | Freq: Every day | SUBCUTANEOUS | Status: DC
  Administered 2013-03-28 – 2013-03-29 (×2): 8 [IU] via SUBCUTANEOUS
  Filled 2013-03-28 (×3): qty 0.08

## 2013-03-28 NOTE — Progress Notes (Signed)
Inpatient Diabetes Program Recommendations  AACE/ADA: New Consensus Statement on Inpatient Glycemic Control (2013)  Target Ranges:  Prepandial:   less than 140 mg/dL      Peak postprandial:   less than 180 mg/dL (1-2 hours)      Critically ill patients:  140 - 180 mg/dL   Results for Amanda Booker, Amanda Booker (MRN [id number]) as of 03/28/2013 13:40  Ref. Range 03/27/2013 23:42 03/28/2013 07:31 03/28/2013 11:33  Glucose-Capillary Latest Range: 70-99 mg/dL 782 (H) 956 (H) 213 (H)    Inpatient Diabetes Program Recommendations Insulin - Basal: Please consider ordering low dose basal insulin; recommend starting Lantus or Levemir 10 units QHS. Insulin - Meal Coverage: Please consdier ordering Novolog 3 untis TID with meals for meal coverage while receiving steroids.  Note: Patient has a history of diabetes and takes Glipizide 10 mg BID and Janumet 50-500 mg BID as an outpatient for diabetes management.  Currently, patient is ordered to receive Novolog 0-20 units AC, Novolog 0-5 units HS, and Glipizide 10 mg BID for inpatient glycemic control.  Initial glucose noted to be 231 mg/dl on 08/6 @ 57:84.  Patient is ordered to receive Solumedrol 40 mg Q8H which is contributing to hyperglycemia.  Please consider ordering low dose basal (Lantus or Levemir 10 units QHS) and ordering Novolog 3 units TID with meals for meal coverage while on steroids.  Will continue to follow.  Thanks, Orlando Penner, RN, MSN, CCRN Diabetes Coordinator Inpatient Diabetes Program [phone] (Team Pager) [phone] (AP office) [phone] [facility] office)

## 2013-03-28 NOTE — Progress Notes (Signed)
03/28/13 1621 Late entry for this afternoon: Dr. Juanetta Gosling aware of consult. States will see patient in the morning. Notified patient. Earnstine Regal, RN

## 2013-03-28 NOTE — Progress Notes (Signed)
UR chart review completed.  

## 2013-03-28 NOTE — Plan of Care (Signed)
Problem: Phase I Progression Outcomes Goal: Progress activity as tolerated unless otherwise ordered Outcome: Completed/Met Date Met:  03/28/13 03/28/13 1606 Patient up to chair this morning, tolerated well. Ambulates in room, instructed to call for assist as needed. Earnstine Regal, RN

## 2013-03-28 NOTE — Progress Notes (Signed)
PATIENT DETAILS Name: Amanda Booker Age: 62 y.o. Sex: female Date of Birth: Jan 14, 1951 Admit Date: 03/27/2013 Admitting Physician Dewayne Shorter Levora Dredge, MD [id number] Molly Maduro, MD  Subjective: Feels better, anxious to go home. However still wheezing.  Assessment/Plan: Principal Problem:   COPD with acute exacerbation - Suspected COPD-given the long history of smoking. - Decrease Solu-Medrol, continue with nebulized bronchodilators, continue Levaquin - Clinically improved, taper off oxygen, need to check for home oxygen requirement   Acute respiratory failure with hypoxia - Secondary to above. - taper off oxygen - Assess home O2 requirement prior to discharge  Hypertension - Moderately controlled, continue with current antihypertensive therapy. - Reviewed [facility] Notes-apparently has unilateral 50% renal artery stenosis-being followed by [facility].  Diabetes - Uncontrolled, suspect this is secondary to steroids. - Check A1c  Tobacco abuse - Counseled Extensively. - Continue transdermal nicotine  Disposition: Remain inpatient  DVT Prophylaxis: Prophylactic Lovenox   Code Status: Full code  Family Communication  none at bedside  Procedures:  None  CONSULTS:  None  Time spent 40 minutes-which includes 50% of the time with face-to-face with patient and family.   MEDICATIONS: Scheduled Meds: . albuterol  2.5 mg Nebulization Q6H  . amLODipine  10 mg Oral q morning - 10a  . aspirin  81 mg Oral q morning - 10a  . atenolol  50 mg Oral BID  . atorvastatin  40 mg Oral q1800  . cloNIDine  0.1 mg Oral BID  . docusate sodium  100 mg Oral BID  . enoxaparin (LOVENOX) injection  40 mg Subcutaneous Q24H  . glipiZIDE  10 mg Oral BID AC  . hydrochlorothiazide  25 mg Oral q morning - 10a  . insulin aspart  0-20 Units Subcutaneous TID WC  . insulin aspart  0-5 Units Subcutaneous QHS  . ipratropium  0.5 mg Nebulization Q6H  . levofloxacin (LEVAQUIN) IV  500 mg  Intravenous Q24H  . lisinopril  40 mg Oral Daily  . methylPREDNISolone (SOLU-MEDROL) injection  40 mg Intravenous Q8H  . nicotine  14 mg Transdermal Daily  . sodium chloride  3 mL Intravenous Q12H   Continuous Infusions:  PRN Meds:.acetaminophen, acetaminophen, albuterol, HYDROcodone-acetaminophen, ondansetron (ZOFRAN) IV, ondansetron  Antibiotics: Anti-infectives   Start     Dose/Rate Route Frequency Ordered Stop   03/27/13 2200  levofloxacin (LEVAQUIN) IVPB 500 mg     500 mg 100 mL/hr over 60 Minutes Intravenous Every 24 hours 03/27/13 2145         PHYSICAL EXAM: Vital signs in last 24 hours: Filed Vitals:   03/28/13 0117 03/28/13 0314 03/28/13 0445 03/28/13 0707  BP:   150/99   Pulse:   88   Temp:   98.1 F (36.7 C)   TempSrc:   Oral   Resp:   20   Height:      Weight:      SpO2: 95% 96% 98% 93%    Weight change:  Filed Weights   03/27/13 1645 03/27/13 2143  Weight: 85.276 kg (188 lb) 84.3 kg (185 lb 13.6 oz)   Body mass index is 29.1 kg/(m^2).   Gen Exam: Awake and alert with clear speech.   Neck: Supple, No JVD.   Chest good air entry bilaterally, some scattered rhonchi.  CVS: S1 S2 Regular, no murmurs.  Abdomen: soft, BS +, non tender, non distended.  Extremities: no edema, lower extremities warm to touch. Neurologic: Non Focal.   Skin: No Rash.   Wounds: N/A.    Intake/Output  from previous day:  Intake/Output Summary (Last 24 hours) at 03/28/13 1332 Last data filed at 03/28/13 0900  Gross per 24 hour  Intake    243 ml  Output      0 ml  Net    243 ml     LAB RESULTS: CBC  Recent Labs Lab 03/27/13 1800 03/28/13 0510  WBC 8.9 8.6  HGB 14.6 15.5*  HCT 42.5 46.3*  PLT 354 359  MCV 87.3 87.7  MCH 30.0 29.4  MCHC 34.4 33.5  RDW 12.8 13.0  LYMPHSABS 2.6  --   MONOABS 0.5  --   EOSABS 0.2  --   BASOSABS 0.1  --     Chemistries   Recent Labs Lab 03/27/13 1800 03/28/13 0510  NA 139 137  K 3.1* 3.8  CL 97 95*  CO2 32 30  GLUCOSE  231* 284*  BUN 17 18  CREATININE 0.66 0.59  CALCIUM 10.4 10.3    CBG:  Recent Labs Lab 03/27/13 2342 03/28/13 0731 03/28/13 1133  GLUCAP 310* 359* 306*    GFR Estimated Creatinine Clearance: 83.5 ml/min (by C-G formula based on Cr of 0.59).  Coagulation profile No results found for this basename: INR, PROTIME,  in the last 168 hours  Cardiac Enzymes  Recent Labs Lab 03/27/13 1800  TROPONINI <0.30    No components found with this basename: POCBNP,  No results found for this basename: DDIMER,  in the last 72 hours No results found for this basename: HGBA1C,  in the last 72 hours No results found for this basename: CHOL, HDL, LDLCALC, TRIG, CHOLHDL, LDLDIRECT,  in the last 72 hours No results found for this basename: TSH, T4TOTAL, FREET3, T3FREE, THYROIDAB,  in the last 72 hours No results found for this basename: VITAMINB12, FOLATE, FERRITIN, TIBC, IRON, RETICCTPCT,  in the last 72 hours No results found for this basename: LIPASE, AMYLASE,  in the last 72 hours  Urine Studies No results found for this basename: UACOL, UAPR, USPG, UPH, UTP, UGL, UKET, UBIL, UHGB, UNIT, UROB, ULEU, UEPI, UWBC, URBC, UBAC, CAST, CRYS, UCOM, BILUA,  in the last 72 hours  MICROBIOLOGY: No results found for this or any previous visit (from the past 240 hour(s)).  RADIOLOGY STUDIES/RESULTS: Dg Chest 2 View  03/27/2013   CLINICAL DATA:  Cough and shortness of breath for several days. History of hypertension, diabetes. Cerebral shunt, chronic kidney disease, hypo cava removed, and smoking.  EXAM: CHEST  2 VIEW  COMPARISON:  06/30/2011  FINDINGS: Patient has right-sided ventriculoperitoneal shunt. Heart size is normal. The lungs are free of focal consolidations and pleural effusions. No pulmonary edema. Visualized osseous structures have a normal appearance.  IMPRESSION: No active cardiopulmonary disease.   Electronically Signed   By: Rosalie Gums M.D.   On: 03/27/2013 17:35    Jeoffrey Massed,  MD  [facility] Pager:[phone]  If 7PM-7AM, please contact night-coverage [email or web address] Password [email or web address] 03/28/2013, 1:32 PM   LOS: 1 day

## 2013-03-28 NOTE — Care Management Note (Signed)
    Page 1 of 1   03/28/2013     12:02:34 PM   CARE MANAGEMENT NOTE 03/28/2013  Patient:  Amanda Booker, Amanda Booker   Account Number:  [id number]  Date Initiated:  03/28/2013  Documentation initiated by:  Sharrie Rothman  Subjective/Objective Assessment:   Pt admitted from home with COPD. Pt lives with her husband and will return home at discharge. Pt is independent with ADL's but does have a cane, walker, and neb machine for home use.     Action/Plan:   WIll need to assess need for home O2 prior to discharge.   Anticipated DC Date:  03/29/2013   Anticipated DC Plan:  HOME/SELF CARE      DC Planning Services  CM consult      Choice offered to / List presented to:             Status of service:  Completed, signed off Medicare Important Message given?   (If response is "NO", the following Medicare IM given date fields will be blank) Date Medicare IM given:   Date Additional Medicare IM given:    Discharge Disposition:  HOME/SELF CARE  Per UR Regulation:    If discussed at Long Length of Stay Meetings, dates discussed:    Comments:  03/28/13 1200 Arlyss Queen, RN BSN CM

## 2013-03-29 LAB — GLUCOSE, CAPILLARY: Glucose-Capillary: 253 mg/dL — ABNORMAL HIGH (ref 70–99)

## 2013-03-29 MED ORDER — METHYLPREDNISOLONE SODIUM SUCC 40 MG IJ SOLR
40.0000 mg | Freq: Two times a day (BID) | INTRAMUSCULAR | Status: DC
  Administered 2013-03-30: 40 mg via INTRAVENOUS
  Filled 2013-03-29: qty 1

## 2013-03-29 NOTE — Progress Notes (Signed)
Attempt to wean patient off oxygen, patient off oxygen for 10 min, monitored stats during this time, O2 sats between 90%-95%

## 2013-03-29 NOTE — Progress Notes (Addendum)
PATIENT DETAILS Name: Amanda Booker Age: 62 y.o. Sex: female Date of Birth: 05/13/1950 Admit Date: 03/27/2013 Admitting Physician Dewayne Shorter Levora Dredge, MD [id number] Molly Maduro, MD  Subjective: Feels better- less wheezing compared to yesterday.  Assessment/Plan: Principal Problem:   COPD with acute exacerbation - Suspected COPD-given the long history of smoking. - Decrease Solu-Medrol to twice a day- suspect we'll be able to change to prednisone tomorrow, continue with nebulized bronchodilators, continue Levaquin - Clinically improved, mean off oxygen as tolerated,  check for home oxygen requirement  - Pulmonary consult appreciated  Acute respiratory failure with hypoxia - Secondary to above. -Wean off oxygen - Assess home O2 requirement prior to discharge  Hypertension - Moderately controlled, continue with current antihypertensive therapy. - Reviewed [facility] Notes-apparently has unilateral 50% renal artery stenosis-being followed by [facility].  Diabetes - Uncontrolled, suspect this is secondary to steroids. Suspect that with decreasing steroids, CBGs will continue to get better. Currently on SSI and low-dose Lantus. -  A1c 7.9  Tobacco abuse - Counseled Extensively. - Continue transdermal nicotine  Disposition: Remain inpatient-? Home in am  DVT Prophylaxis: Prophylactic Lovenox   Code Status: Full code  Family Communication  none at bedside  Procedures:  None  CONSULTS:  None  MEDICATIONS: Scheduled Meds: . albuterol  2.5 mg Nebulization Q6H  . amLODipine  10 mg Oral q morning - 10a  . aspirin  81 mg Oral q morning - 10a  . atenolol  50 mg Oral BID  . atorvastatin  40 mg Oral q1800  . cloNIDine  0.1 mg Oral BID  . docusate sodium  100 mg Oral BID  . enoxaparin (LOVENOX) injection  40 mg Subcutaneous Q24H  . glipiZIDE  10 mg Oral BID AC  . hydrochlorothiazide  25 mg Oral q morning - 10a  . insulin aspart  0-20 Units Subcutaneous TID WC  . insulin  aspart  0-5 Units Subcutaneous QHS  . insulin glargine  8 Units Subcutaneous QHS  . ipratropium  0.5 mg Nebulization Q6H  . levofloxacin (LEVAQUIN) IV  500 mg Intravenous Q24H  . lisinopril  40 mg Oral Daily  . methylPREDNISolone (SOLU-MEDROL) injection  40 mg Intravenous Q8H  . nicotine  14 mg Transdermal Daily  . sodium chloride  3 mL Intravenous Q12H   Continuous Infusions:  PRN Meds:.acetaminophen, acetaminophen, albuterol, HYDROcodone-acetaminophen, ondansetron (ZOFRAN) IV, ondansetron  Antibiotics: Anti-infectives   Start     Dose/Rate Route Frequency Ordered Stop   03/27/13 2200  levofloxacin (LEVAQUIN) IVPB 500 mg     500 mg 100 mL/hr over 60 Minutes Intravenous Every 24 hours 03/27/13 2145         PHYSICAL EXAM: Vital signs in last 24 hours: Filed Vitals:   03/29/13 0630 03/29/13 0738 03/29/13 0855 03/29/13 1349  BP: 115/83     Pulse: 65     Temp: 97.9 F (36.6 C)     TempSrc:      Resp: 20     Height:      Weight:      SpO2: 98% 94% 92% 88%    Weight change:  Filed Weights   03/27/13 1645 03/27/13 2143  Weight: 85.276 kg (188 lb) 84.3 kg (185 lb 13.6 oz)   Body mass index is 29.1 kg/(m^2).   Gen Exam: Awake and alert with clear speech.   Neck: Supple, No JVD.   Chest good air entry bilaterally, with some scattered rhonchi..  CVS: S1 S2 Regular, no murmurs.  Abdomen: soft, BS +,  non tender, non distended.  Extremities: no edema, lower extremities warm to touch. Neurologic: Non Focal.   Skin: No Rash.   Wounds: N/A.    Intake/Output from previous day:  Intake/Output Summary (Last 24 hours) at 03/29/13 1406 Last data filed at 03/29/13 1300  Gross per 24 hour  Intake    920 ml  Output    600 ml  Net    320 ml     LAB RESULTS: CBC  Recent Labs Lab 03/27/13 1800 03/28/13 0510  WBC 8.9 8.6  HGB 14.6 15.5*  HCT 42.5 46.3*  PLT 354 359  MCV 87.3 87.7  MCH 30.0 29.4  MCHC 34.4 33.5  RDW 12.8 13.0  LYMPHSABS 2.6  --   MONOABS 0.5  --    EOSABS 0.2  --   BASOSABS 0.1  --     Chemistries   Recent Labs Lab 03/27/13 1800 03/28/13 0510  NA 139 137  K 3.1* 3.8  CL 97 95*  CO2 32 30  GLUCOSE 231* 284*  BUN 17 18  CREATININE 0.66 0.59  CALCIUM 10.4 10.3    CBG:  Recent Labs Lab 03/28/13 1133 03/28/13 1654 03/28/13 2039 03/29/13 0723 03/29/13 1135  GLUCAP 306* 294* 313* 253* 265*    GFR Estimated Creatinine Clearance: 83.5 ml/min (by C-G formula based on Cr of 0.59).  Coagulation profile No results found for this basename: INR, PROTIME,  in the last 168 hours  Cardiac Enzymes  Recent Labs Lab 03/27/13 1800  TROPONINI <0.30    No components found with this basename: POCBNP,  No results found for this basename: DDIMER,  in the last 72 hours  Recent Labs  03/28/13 0510  HGBA1C 7.9*   No results found for this basename: CHOL, HDL, LDLCALC, TRIG, CHOLHDL, LDLDIRECT,  in the last 72 hours No results found for this basename: TSH, T4TOTAL, FREET3, T3FREE, THYROIDAB,  in the last 72 hours No results found for this basename: VITAMINB12, FOLATE, FERRITIN, TIBC, IRON, RETICCTPCT,  in the last 72 hours No results found for this basename: LIPASE, AMYLASE,  in the last 72 hours  Urine Studies No results found for this basename: UACOL, UAPR, USPG, UPH, UTP, UGL, UKET, UBIL, UHGB, UNIT, UROB, ULEU, UEPI, UWBC, URBC, UBAC, CAST, CRYS, UCOM, BILUA,  in the last 72 hours  MICROBIOLOGY: Recent Results (from the past 240 hour(s))  URINE CULTURE     Status: None   Collection Time    03/27/13  6:40 PM      Result Value Range Status   Specimen Description URINE, CLEAN CATCH   Final   Special Requests NONE   Final   Culture  Setup Time     Final   Value: 03/27/2013 22:30     Performed at [facility] Count     Final   Value: 75,000 COLONIES/ML     Performed at [facility]   Culture     Final   Value: GRAM NEGATIVE RODS     Performed at [facility]   Report Status  PENDING   Incomplete    RADIOLOGY STUDIES/RESULTS: Dg Chest 2 View  03/27/2013   CLINICAL DATA:  Cough and shortness of breath for several days. History of hypertension, diabetes. Cerebral shunt, chronic kidney disease, hypo cava removed, and smoking.  EXAM: CHEST  2 VIEW  COMPARISON:  06/30/2011  FINDINGS: Patient has right-sided ventriculoperitoneal shunt. Heart size is normal. The lungs are free of focal consolidations  and pleural effusions. No pulmonary edema. Visualized osseous structures have a normal appearance.  IMPRESSION: No active cardiopulmonary disease.   Electronically Signed   By: Rosalie Gums M.D.   On: 03/27/2013 17:35    Jeoffrey Massed, MD  [facility] Pager:[phone]  If 7PM-7AM, please contact night-coverage [email or web address] Password [email or web address] 03/29/2013, 2:06 PM   LOS: 2 days

## 2013-03-29 NOTE — Progress Notes (Signed)
Attempted to waen patient off oxygen, removed oxygen, within a few minutes patient O2 sats dropped to 88%, remained at 88% without oxygen. Put oxygen 2L [place] back on patient, O2 sats increased to 94%.

## 2013-03-29 NOTE — Consult Note (Signed)
Consult requested by:Dr. Ghimire Consult requested for COPD:  HPI: This is a 62 year old who went to see her primary care physician because of chest congestion and was found to be hypoxic and sent to the hospital because of that. She has a smoking history of about 30+ years but no diagnosis of COPD made in the past. She says that up until she got acutely sick she was able to manage all of her activities of daily living housework etc. without shortness of breath. Her husband confirms that she was asymptomatic.  Past Medical History  Diagnosis Date  . Hypertension   . Diabetes mellitus   . Cerebral ventricular shunt fitting or adjustment   . Hypokalemia   . Coagulopathy   . Chronic kidney disease   . Kidney stone   . Renal artery stenosis   . Cerebral aneurysm rupture      Family History  Problem Relation Age of Onset  . Cerebral aneurysm Sister   . Hypertension Sister   . Kidney failure Sister   . Cancer Mother     uterine  . Hyperlipidemia Mother   . Diabetes Brother   . Hypertension Brother   . Diabetes Brother   . Hypertension Brother      History   Social History  . Marital Status: Married    Spouse Name: N/A    Number of Children: N/A  . Years of Education: N/A   Social History Main Topics  . Smoking status: Current Every Day Smoker -- 0.50 packs/day for 22 years    Types: Cigarettes  . Smokeless tobacco: Never Used  . Alcohol Use: No  . Drug Use: No  . Sexual Activity: None   Other Topics Concern  . None   Social History Narrative  . None     ROS: She has not had any hemoptysis. She has not had chest pain. No swelling of her legs.    Objective: Vital signs in last 24 hours: Temp:  [97.7 F (36.5 C)-98.5 F (36.9 C)] 97.9 F (36.6 C) (12/06 0630) Pulse Rate:  [65-91] 65 (12/06 0630) Resp:  [20] 20 (12/06 0630) BP: (115-162)/(72-88) 115/83 mmHg (12/06 0630) SpO2:  [88 %-99 %] 92 % (12/06 0855) Weight change:  Last BM Date:  03/28/13  Intake/Output from previous day: 12/05 0701 - 12/06 0700 In: 923 [P.O.:720; I.V.:3; IV Piggyback:200] Out: -   PHYSICAL EXAM She is awake and alert. She has disconjugate gaze. Her neck is supple without masses. She does not have JVD. Her chest shows some rhonchi bilaterally. Her heart is regular without gallop. Her abdomen is soft with no masses. Extremities showed no edema. Central nervous system examination is grossly intact. She has had a previous ruptured aneurysm but has recovered  Lab Results: Basic Metabolic Panel:  Recent Labs  40/98/11 1800 03/28/13 0510  NA 139 137  K 3.1* 3.8  CL 97 95*  CO2 32 30  GLUCOSE 231* 284*  BUN 17 18  CREATININE 0.66 0.59  CALCIUM 10.4 10.3   Liver Function Tests:  Recent Labs  03/28/13 0510  AST 13  ALT 23  ALKPHOS 72  BILITOT 0.4  PROT 8.0  ALBUMIN 3.8   No results found for this basename: LIPASE, AMYLASE,  in the last 72 hours No results found for this basename: AMMONIA,  in the last 72 hours CBC:  Recent Labs  03/27/13 1800 03/28/13 0510  WBC 8.9 8.6  NEUTROABS 5.6  --   HGB 14.6 15.5*  HCT  42.5 46.3*  MCV 87.3 87.7  PLT 354 359   Cardiac Enzymes:  Recent Labs  03/27/13 1800  TROPONINI <0.30   BNP:  Recent Labs  03/27/13 1800  PROBNP 34.0   D-Dimer: No results found for this basename: DDIMER,  in the last 72 hours CBG:  Recent Labs  03/27/13 2342 03/28/13 0731 03/28/13 1133 03/28/13 1654 03/28/13 2039 03/29/13 0723  GLUCAP 310* 359* 306* 294* 313* 253*   Hemoglobin A1C:  Recent Labs  03/28/13 0510  HGBA1C 7.9*   Fasting Lipid Panel: No results found for this basename: CHOL, HDL, LDLCALC, TRIG, CHOLHDL, LDLDIRECT,  in the last 72 hours Thyroid Function Tests: No results found for this basename: TSH, T4TOTAL, FREET4, T3FREE, THYROIDAB,  in the last 72 hours Anemia Panel: No results found for this basename: VITAMINB12, FOLATE, FERRITIN, TIBC, IRON, RETICCTPCT,  in the last 72  hours Coagulation: No results found for this basename: LABPROT, INR,  in the last 72 hours Urine Drug Screen: Drugs of Abuse  No results found for this basename: labopia, cocainscrnur, labbenz, amphetmu, thcu, labbarb    Alcohol Level: No results found for this basename: ETH,  in the last 72 hours Urinalysis:  Recent Labs  03/27/13 1840  COLORURINE YELLOW  LABSPEC >1.030*  PHURINE 5.5  GLUCOSEU NEGATIVE  HGBUR SMALL*  BILIRUBINUR NEGATIVE  KETONESUR NEGATIVE  PROTEINUR NEGATIVE  UROBILINOGEN 0.2  NITRITE NEGATIVE  LEUKOCYTESUR NEGATIVE   Misc. Labs:   ABGS: No results found for this basename: PHART, PCO2, PO2ART, TCO2, HCO3,  in the last 72 hours   MICROBIOLOGY: No results found for this or any previous visit (from the past 240 hour(s)).  Studies/Results: Dg Chest 2 View  03/27/2013   CLINICAL DATA:  Cough and shortness of breath for several days. History of hypertension, diabetes. Cerebral shunt, chronic kidney disease, hypo cava removed, and smoking.  EXAM: CHEST  2 VIEW  COMPARISON:  06/30/2011  FINDINGS: Patient has right-sided ventriculoperitoneal shunt. Heart size is normal. The lungs are free of focal consolidations and pleural effusions. No pulmonary edema. Visualized osseous structures have a normal appearance.  IMPRESSION: No active cardiopulmonary disease.   Electronically Signed   By: Rosalie Gums M.D.   On: 03/27/2013 17:35    Medications:  Prior to Admission:  Prescriptions prior to admission  Medication Sig Dispense Refill  . amLODipine (NORVASC) 10 MG tablet Take 10 mg by mouth every morning.       Marland Kitchen aspirin 81 MG tablet Take 81 mg by mouth every morning.       Marland Kitchen atenolol (TENORMIN) 50 MG tablet Take 50 mg by mouth 2 (two) times daily.       Marland Kitchen atorvastatin (LIPITOR) 40 MG tablet Take 40 mg by mouth every morning.      . cloNIDine (CATAPRES) 0.1 MG tablet 0.1 mg 2 (two) times daily.       Marland Kitchen glipiZIDE (GLUCOTROL) 10 MG tablet Take 10 mg by mouth 2 (two)  times daily.      . hydrochlorothiazide (HYDRODIURIL) 25 MG tablet Take 25 mg by mouth every morning.      . quinapril (ACCUPRIL) 40 MG tablet Take 40 mg by mouth every morning.       . sitaGLIPtin-metformin (JANUMET) 50-500 MG per tablet Take 1 tablet by mouth 2 (two) times daily.       Scheduled: . albuterol  2.5 mg Nebulization Q6H  . amLODipine  10 mg Oral q morning - 10a  . aspirin  81 mg Oral q morning - 10a  . atenolol  50 mg Oral BID  . atorvastatin  40 mg Oral q1800  . cloNIDine  0.1 mg Oral BID  . docusate sodium  100 mg Oral BID  . enoxaparin (LOVENOX) injection  40 mg Subcutaneous Q24H  . glipiZIDE  10 mg Oral BID AC  . hydrochlorothiazide  25 mg Oral q morning - 10a  . insulin aspart  0-20 Units Subcutaneous TID WC  . insulin aspart  0-5 Units Subcutaneous QHS  . insulin glargine  8 Units Subcutaneous QHS  . ipratropium  0.5 mg Nebulization Q6H  . levofloxacin (LEVAQUIN) IV  500 mg Intravenous Q24H  . lisinopril  40 mg Oral Daily  . methylPREDNISolone (SOLU-MEDROL) injection  40 mg Intravenous Q8H  . nicotine  14 mg Transdermal Daily  . sodium chloride  3 mL Intravenous Q12H   Continuous:  ZOX:[id number], acetaminophen, albuterol, HYDROcodone-acetaminophen, ondansetron (ZOFRAN) IV, ondansetron  Assesment: She has COPD which by history at home has been fairly asymptomatic. It looks like she may have to go home on oxygen at least temporarily. Her husband is worried about that because he heats the home with a wood heating system. I think it would be safe for her to have oxygen at home as long as she does not get near the open flame. She will need maintenance medications for COPD at least temporarily. If she returns to her asymptomatic state and after she recovers from this acute exacerbation then she may not need to be on maintenance medications long-term. She will need to have a pulmonary function test once she is improved. She says she wants to go home but she is still on  IV antibiotics and IV steroids and still having symptoms and hypoxia. I explained to her and her husband that it would be preferable for her to improve some more before she is discharged which will reduce the risk of her having to return. Principal Problem:   COPD with acute exacerbation Active Problems:   Type II or unspecified type diabetes mellitus without mention of complication, not stated as uncontrolled   Tobacco abuse   Hypoxia   HTN (hypertension), benign    Plan: When she is ready for discharge, I would send her home on Spiriva and Advair, or Symbicort or Dulera depending on her insurance coverage she will also need a rescue inhaler. She may need oxygen based on her oxygen saturations this morning.  Thanks for allowing me to see her with you    LOS: 2 days   Alejandra Hunt L 03/29/2013, 9:42 AM

## 2013-03-30 LAB — GLUCOSE, CAPILLARY

## 2013-03-30 LAB — URINE CULTURE

## 2013-03-30 MED ORDER — ALBUTEROL SULFATE HFA 108 (90 BASE) MCG/ACT IN AERS: 2.0000 | INHALATION_SPRAY | RESPIRATORY_TRACT | Status: DC | PRN

## 2013-03-30 MED ORDER — PREDNISONE 20 MG PO TABS
40.0000 mg | ORAL_TABLET | Freq: Every day | ORAL | Status: DC
  Administered 2013-03-30: 40 mg via ORAL
  Filled 2013-03-30: qty 2

## 2013-03-30 MED ORDER — PREDNISONE 10 MG PO TABS: ORAL_TABLET | ORAL | Status: DC

## 2013-03-30 MED ORDER — TIOTROPIUM BROMIDE MONOHYDRATE 18 MCG IN CAPS
18.0000 ug | ORAL_CAPSULE | Freq: Every day | RESPIRATORY_TRACT | Status: DC
  Filled 2013-03-30: qty 5

## 2013-03-30 MED ORDER — MOMETASONE FURO-FORMOTEROL FUM 100-5 MCG/ACT IN AERO
2.0000 | INHALATION_SPRAY | Freq: Two times a day (BID) | RESPIRATORY_TRACT | Status: DC
  Filled 2013-03-30: qty 8.8

## 2013-03-30 MED ORDER — MOMETASONE FURO-FORMOTEROL FUM 100-5 MCG/ACT IN AERO: 2.0000 | INHALATION_SPRAY | Freq: Two times a day (BID) | RESPIRATORY_TRACT | Status: DC

## 2013-03-30 MED ORDER — NICOTINE 14 MG/24HR TD PT24: 14.0000 mg | MEDICATED_PATCH | Freq: Every day | TRANSDERMAL | Status: DC

## 2013-03-30 MED ORDER — TIOTROPIUM BROMIDE MONOHYDRATE 18 MCG IN CAPS: 18.0000 ug | ORAL_CAPSULE | Freq: Every day | RESPIRATORY_TRACT | Status: DC

## 2013-03-30 MED ORDER — ALBUTEROL SULFATE (5 MG/ML) 0.5% IN NEBU: 2.5000 mg | INHALATION_SOLUTION | Freq: Four times a day (QID) | RESPIRATORY_TRACT | Status: DC | PRN

## 2013-03-30 NOTE — Progress Notes (Signed)
Patient 02 sats 93% room air. Ambulated patient without oxygen. Monitored O2 sats, O2 sats stayed above 90%

## 2013-03-30 NOTE — Progress Notes (Signed)
Patient with orders to be discharge home. Discharge instructions given to patient, verbalized understanding. Patient verbalized use of inhaler, also states daughter is [employer] fundraiser and will home today to help. Prescriptions given. Patient stable. Patient escorted by staff. Patient left in private vehicle with spouse.

## 2013-03-30 NOTE — Discharge Summary (Addendum)
PATIENT DETAILS Name: Amanda Booker Age: 62 y.o. Sex: female Date of Birth: 04-Apr-1951 MRN: [id number]. Admit Date: 03/27/2013 Admitting Physician: Maretta Bees, MD WUJ:[id number],NWGNFAO Molly Maduro, MD  Recommendations for Outpatient Follow-up:  1. Optimize COPD regimen 2. Outpatient PFT's when patient follows with Dr Juanetta Gosling 3. Continued counseling regarding tobacco use  PRIMARY DISCHARGE DIAGNOSIS:  Principal Problem:   COPD with acute exacerbation Active Problems:   Type II or unspecified type diabetes mellitus without mention of complication, not stated as uncontrolled   Tobacco abuse   Hypoxia   HTN (hypertension), benign      PAST MEDICAL HISTORY: Past Medical History  Diagnosis Date  . Hypertension   . Diabetes mellitus   . Cerebral ventricular shunt fitting or adjustment   . Hypokalemia   . Coagulopathy   . Chronic kidney disease   . Kidney stone   . Renal artery stenosis   . Cerebral aneurysm rupture     DISCHARGE MEDICATIONS:   Medication List         albuterol (5 MG/ML) 0.5% nebulizer solution  Commonly known as:  PROVENTIL  Take 0.5 mLs (2.5 mg total) by nebulization every 6 (six) hours as needed for wheezing or shortness of breath.     albuterol 108 (90 BASE) MCG/ACT inhaler  Commonly known as:  PROVENTIL HFA;VENTOLIN HFA  Inhale 2 puffs into the lungs every 4 (four) hours as needed for wheezing or shortness of breath.     amLODipine 10 MG tablet  Commonly known as:  NORVASC  Take 10 mg by mouth every morning.     aspirin 81 MG tablet  Take 81 mg by mouth every morning.     atenolol 50 MG tablet  Commonly known as:  TENORMIN  Take 50 mg by mouth 2 (two) times daily.     atorvastatin 40 MG tablet  Commonly known as:  LIPITOR  Take 40 mg by mouth every morning.     cloNIDine 0.1 MG tablet  Commonly known as:  CATAPRES  0.1 mg 2 (two) times daily.     glipiZIDE 10 MG tablet  Commonly known as:  GLUCOTROL  Take 10 mg by mouth 2 (two)  times daily.     hydrochlorothiazide 25 MG tablet  Commonly known as:  HYDRODIURIL  Take 25 mg by mouth every morning.     mometasone-formoterol 100-5 MCG/ACT Aero  Commonly known as:  DULERA  Inhale 2 puffs into the lungs 2 (two) times daily.     nicotine 14 mg/24hr patch  Commonly known as:  NICODERM CQ - dosed in mg/24 hours  Place 1 patch (14 mg total) onto the skin daily.     predniSONE 10 MG tablet  Commonly known as:  DELTASONE  - Take 4 tablets (40 mg) daily for 2 days, then,  - Take 3 tablets (30 mg) daily for 2 days, then,  - Take 2 tablets (20 mg) daily for 2 days, then,  - Take 1 tablet (10mg ) daily for 1 day and then stop     quinapril 40 MG tablet  Commonly known as:  ACCUPRIL  Take 40 mg by mouth every morning.     sitaGLIPtin-metformin 50-500 MG per tablet  Commonly known as:  JANUMET  Take 1 tablet by mouth 2 (two) times daily.     tiotropium 18 MCG inhalation capsule  Commonly known as:  SPIRIVA  Place 1 capsule (18 mcg total) into inhaler and inhale daily.  ALLERGIES:  No Known Allergies  BRIEF HPI:  See H&P, Labs, Consult and Test reports for all details in brief, Amanda Booker is a 62 y.o. female with a past medical history o, diabetes, hypertension, history of cerebral aneurysm in 1996 resulting in a stroke, history of kidney stone, who was in her usual state of health about 3 days prior to admission, when she started having wheezing and shortness of breath. This progressively worsened. She started having a cough with greenish expectoration. She was thought to have COPD with exacerbation, and was admitted for further evaluation and treatment.  CONSULTATIONS:   pulmonary/intensive care  PERTINENT RADIOLOGIC STUDIES: Dg Chest 2 View  03/27/2013   CLINICAL DATA:  Cough and shortness of breath for several days. History of hypertension, diabetes. Cerebral shunt, chronic kidney disease, hypo cava removed, and smoking.  EXAM: CHEST  2 VIEW   COMPARISON:  06/30/2011  FINDINGS: Patient has right-sided ventriculoperitoneal shunt. Heart size is normal. The lungs are free of focal consolidations and pleural effusions. No pulmonary edema. Visualized osseous structures have a normal appearance.  IMPRESSION: No active cardiopulmonary disease.   Electronically Signed   By: Rosalie Gums M.D.   On: 03/27/2013 17:35     PERTINENT LAB RESULTS: CBC:  Recent Labs  03/27/13 1800 03/28/13 0510  WBC 8.9 8.6  HGB 14.6 15.5*  HCT 42.5 46.3*  PLT 354 359   CMET CMP     Component Value Date/Time   NA 137 03/28/2013 0510   K 3.8 03/28/2013 0510   CL 95* 03/28/2013 0510   CO2 30 03/28/2013 0510   GLUCOSE 284* 03/28/2013 0510   BUN 18 03/28/2013 0510   CREATININE 0.59 03/28/2013 0510   CALCIUM 10.3 03/28/2013 0510   PROT 8.0 03/28/2013 0510   ALBUMIN 3.8 03/28/2013 0510   AST 13 03/28/2013 0510   ALT 23 03/28/2013 0510   ALKPHOS 72 03/28/2013 0510   BILITOT 0.4 03/28/2013 0510   GFRNONAA >90 03/28/2013 0510   GFRAA >90 03/28/2013 0510    GFR Estimated Creatinine Clearance: 83.5 ml/min (by C-G formula based on Cr of 0.59). No results found for this basename: LIPASE, AMYLASE,  in the last 72 hours  Recent Labs  03/27/13 1800  TROPONINI <0.30   No components found with this basename: POCBNP,  No results found for this basename: DDIMER,  in the last 72 hours  Recent Labs  03/28/13 0510  HGBA1C 7.9*   No results found for this basename: CHOL, HDL, LDLCALC, TRIG, CHOLHDL, LDLDIRECT,  in the last 72 hours No results found for this basename: TSH, T4TOTAL, FREET3, T3FREE, THYROIDAB,  in the last 72 hours No results found for this basename: VITAMINB12, FOLATE, FERRITIN, TIBC, IRON, RETICCTPCT,  in the last 72 hours Coags: No results found for this basename: PT, INR,  in the last 72 hours Microbiology: Recent Results (from the past 240 hour(s))  URINE CULTURE     Status: None   Collection Time    03/27/13  6:40 PM      Result Value Range  Status   Specimen Description URINE, CLEAN CATCH   Final   Special Requests NONE   Final   Culture  Setup Time     Final   Value: 03/27/2013 22:30     Performed at [facility] Count     Final   Value: 75,000 COLONIES/ML     Performed at [facility]  Final   Value: GRAM NEGATIVE RODS     Performed at [facility]   Report Status PENDING   Incomplete     BRIEF HOSPITAL COURSE:   Principal Problem: COPD with acute exacerbation  - Suspected COPD (newly diagnosed)-given the long history of smoking.  - Patient was admitted and started on IV Solumedrol, nebulized bronchodilators and empiric Antibiotics, with clinical improvement, Solu-Medrol was slowly decreased, and by the day of discharge, she was transitioned to prednisone. Her CXR was negative for PNA, hence will not continue with Levaquin at discharge. She will be discharged, in the above noted medications. Home O2 screen was done this morning, her O2 saturations were consistently above 90 percent on room air at rest and during ambulation.She was seen by Pulmonology, Dr Juanetta Gosling, who will continue to follow patient in the outpatient setting, and arrange for PFT's.  Active Problems: Acute respiratory failure with hypoxia  - Secondary to above.This has resolved by the day of discharge. -does not need O2 on discharge-see ablve  Hypertension  - Moderately controlled, continue with current antihypertensive therapy.  - Reviewed [facility] Notes-apparently has unilateral 50% renal artery stenosis-being followed by [facility]. Have asked patient to keep next appt with [facility]  Diabetes -- A1c 7.9 -Uncontrolled, suspect this is secondary to steroids. Suspect that with decreasing steroids, CBGs will continue to get better. This was managed by SSI and low-dose Lantus.On discharge, she will be transitioned back to her usual pre-admission medications.  Tobacco Abuse -will provide Nicotine transdermal on discharge.  She was counseled extensively, and expresses desire to quit.  TODAY-DAY OF DISCHARGE:  Subjective:   Tandy Hopson today has no headache,no chest abdominal pain,no new weakness tingling or numbness, feels much better wants to go home today.  Objective:   Blood pressure 146/71, pulse 73, temperature 98.3 F (36.8 C), temperature source Oral, resp. rate 20, height 5\' 7"  (1.702 m), weight 84.3 kg (185 lb 13.6 oz), SpO2 97.00%.  Intake/Output Summary (Last 24 hours) at 03/30/13 0929 Last data filed at 03/29/13 2313  Gross per 24 hour  Intake    240 ml  Output   1600 ml  Net  -1360 ml   Filed Weights   03/27/13 1645 03/27/13 2143  Weight: 85.276 kg (188 lb) 84.3 kg (185 lb 13.6 oz)    Exam Awake Alert, Oriented *3, No new F.N deficits, Normal affect [place].AT,PERRAL Supple Neck,No JVD, No cervical lymphadenopathy appriciated.  Symmetrical Chest wall movement, Good air movement bilaterally, CTAB RRR,No Gallops,Rubs or new Murmurs, No Parasternal Heave +ve B.Sounds, Abd Soft, Non tender, No organomegaly appriciated, No rebound -guarding or rigidity. No Cyanosis, Clubbing or edema, No new Rash or bruise  DISCHARGE CONDITION: Stable  DISPOSITION: Home  DISCHARGE INSTRUCTIONS:    Activity:  As tolerated   Diet recommendation: Diabetic Diet Heart Healthy diet      Discharge Orders   Future Appointments Provider Department Dept Phone   10/03/2013 9:00 AM [facility] [address] [phone]   Eat a light meal the night before the exam Nothing to eat or drink for at least 9 hours before the exam No gum chewing, or smoking the morning of the exam Please take your morning medications with small sips of water, especially blood pressure medication *Very Important* Please wear 2 piece clothing   10/03/2013 10:00 AM Fransisco Hertz, MD [facility] [phone]   Future Orders Complete By Expires   Call MD for:  difficulty  breathing,  headache or visual disturbances  As directed    Diet - low sodium heart healthy  As directed    Increase activity slowly  As directed       Follow-up Information   Follow up with Josue Hector, MD. Schedule an appointment as soon as possible for a visit in 1 week.   Specialty:  Family Medicine   Contact information:   [address] [place] [address] [phone]       Follow up with HAWKINS,EDWARD L, MD. Schedule an appointment as soon as possible for a visit in 2 weeks.   Specialty:  Pulmonary Disease   Contact information:   [address] [place] [address] [phone]      Total Time spent on discharge equals 45 minutes.  SignedJeoffrey Massed 03/30/2013 9:29 AM

## 2013-03-30 NOTE — Progress Notes (Signed)
She has improved and is to be discharged today. She has no new complaints. She does not need oxygen at this point. She does need outpatient pulmonary function testing. I will of course plan to sign off  Thanks for allowing me to see her with you

## 2013-03-30 NOTE — Progress Notes (Signed)
Pt was off O2 at begining of shift and pt was sating in low 90's. When RT made rounds for breathing treatment, pt's O2 was at 87%. RT placed pt back on 2L for the night. Pt sats are upper 90's with O2. Will continue to monitor.

## 2013-10-02 ENCOUNTER — Encounter: Payer: Self-pay | Admitting: Vascular Surgery

## 2013-10-03 ENCOUNTER — Encounter: Payer: Self-pay | Admitting: Family

## 2013-10-03 ENCOUNTER — Ambulatory Visit (INDEPENDENT_AMBULATORY_CARE_PROVIDER_SITE_OTHER): Payer: Medicare HMO | Admitting: Family

## 2013-10-03 ENCOUNTER — Ambulatory Visit (HOSPITAL_COMMUNITY)
Admission: RE | Admit: 2013-10-03 | Discharge: 2013-10-03 | Disposition: A | Discharge status: Home / Self Care | Payer: Medicare HMO | Source: Ambulatory Visit | Attending: Family | Admitting: Family

## 2013-10-03 VITALS — BP 160/88 | HR 68 | Resp 16 | Ht 67.0 in | Wt 198.0 lb

## 2013-10-03 DIAGNOSIS — I701 Atherosclerosis of renal artery: Secondary | ICD-10-CM

## 2013-10-03 NOTE — Patient Instructions (Signed)
Renal Artery Stenosis Renal artery stenosis (RAS) is the narrowing of the artery that supplies blood to the kidney. If the narrowing is critical and the kidney does not get enough blood, hypertension (high blood pressure) can develop. This is called renal vascular hypertension (RVH). This is a common, uncommon cause of secondary hypertension. It does not usually happen until there is at least a 70% narrowing of the artery. Decreased blood flow through the renal artery causes the kidney to release increased amounts of a hormone. It is called renin. Renin is a strong blood pressure regulator. When it is high, it causes changes that lead to hypertension. Eventually the kidney not receiving enough blood may shrink in size and become less useful. The high blood pressure that is produced can eventually damage and destroy the remaining kidney. This is called hypertensive nephrosclerosis. If both kidneys fail, it will lead to chronic renal failure.  CAUSES  Most renal artery stenosis is caused by a hardening of the arteries (atherosclerosis). This is called Atherosclerotic Renal Artery Stenosis (AS-RAS). It is caused by a build-up of cholesterol (plaques) on the inner lining of the renal artery. A much less common cause is Fibromuscular Dysplasia (FMD). With it, there is an abnormality in the muscular lining of the renal artery. FMD-RAS occurs almost exclusively in women aged 30 to 40. It rarely affects African Americans or Asians.  SYMPTOMS  Often high blood pressure is discovered on a routine blood pressure check. It may be the only sign that something is wrong. Other problems that may occur are:  You may develop calf pain when walking. This is called intermittent claudication. It may be a sign of bad circulation in the legs.  Inability to use certain blood pressure pills such as angiotensin-I (ACE-I) inhibitors or angiotensin receptor blockers (ARB's). These could cause sudden drops in blood pressure with  worsening of kidney function.  More than three antihypertensive medications may be needed for blood pressure control.  New onset of high blood pressure if you are over 55. DIAGNOSIS  Your caregiver may find suggestions of this on exam if he finds bruits (like murmurs) on listening to your abdomen (belly) or the large arteries in your neck. Your caregiver may also suspect this there is a sudden worsening of your blood pressure when it has been well controlled and you are over age 60. Additional testing that may be done includes:  One diagnostic method used for renal artery stenosis (RAS) is to measure and compare the level of renin, (blood pressure-regulating hormone released by the kidneys), in the right to the left renal veins. If the amount of renin released by one-side is markedly higher than the other, this identifies a high renin-releasing kidney consistent with RAS.  FMD-RAS is often found on renal scan with ACE-inhibitor challenge, or ultrasound with Doppler.  FMD responds well to angioplasty and stenting. The results of stenting in FMD are usually long lasting. RISK FACTORS  Most renal artery stenosis is caused by a hardening of the arteries. This is called atherosclerosis. Other risk factors associated with the development of atherosclerotic RAS include the following:   Carotid artery disease.  Obesity.  High blood pressure.  Heredity.  Old age.  Fibromuscular dysplasia.  Diabetes mellitus.  Smoking.  Hardening of the arteries. TREATMENT   Renal vascular hypertension can be very severe. It can also be difficult to control.  Medication is used to control high blood pressure (hypertension). Blood pressure medications that directly affect the renin angiotensin pathway   can be used toe help control blood pressure. ACE inhibitors and angiotensin receptor blockers (ARB's) are often effective in patients with unilateral RAS. In some cases, patients with RAS are resistant to  these medications.  In patients with bilateral RAS, these medications must be used carefully. They may cause acute renal failure (ARF). If acute renal failure develops (if creatinine increases by more than 30%), the medication is discontinued. The patient is evaluated for bilateral RAS.  Angioplasty and stenting may be used to improve blood flow. The goal is to improve the circulation of blood flow to the kidney and prevent the release of excess renin, which can help to decrease blood pressure. This helps to prevent atrophy of the kidney. In general, patients with AS-RAS should have stenting done. This is because plasty by itself has a high incidence of re-stenosis.  Surgery to bypass the narrowing may be done. If the kidney with RAS has diminished in size or strength (atrophied ), surgical removal of the kidney may be advised. This is called nephrectomy. Document Released: 01/04/2005 Document Revised: 07/03/2011 Document Reviewed: 04/09/2008 ExitCare Patient Information 2014 [employer].  

## 2013-10-03 NOTE — Progress Notes (Signed)
Established Renal Artery Stenosis  History of Present Illness  Amanda Booker is a 63 y.o. (28-Oct-1950) female who patient of Dr. Imogene Burnhen who presents with chief complaint: follow up on renal artery stenosis. Previous renal duplex completed on 10/04/12 reveals: R RA stenosis: <60% and L RA stenosis: >60%. The patient's blood pressure has been titrated at this point and the regimen is now stable. The patient's urinary history has remained stable. On previous aortogram, it failed to demonstrate a >60% stenosis in either RA presents with chief complaint: follow up on renal artery stenosis.    The patient's blood pressure has been stable, but states it is usually 150's-160's/70's which is elevated.  The patient's blood pressure medication regimen has  remained stable, no recent adjustments.  The patient's urinary history has remained stable. Pt states her niece checks her blood sugar, is in good control, she checks her blood pressure at a pharmacy. She takes a daily ASA and statin. In 1996 she had a cerebral bleed and repair which caused left side hemiparesis, she still has mild left sided weakness.  Past Medical History  Diagnosis Date  . Hypertension   . Diabetes mellitus   . Cerebral ventricular shunt fitting or adjustment   . Hypokalemia   . Coagulopathy   . Chronic kidney disease   . Kidney stone   . Renal artery stenosis   . Cerebral aneurysm rupture   . Renal artery stenosis    Past Surgical History  Procedure Laterality Date  . Abdominal hysterectomy  2007  . Cerebral shunt     History   Social History  . Marital Status: Married    Spouse Name: N/A    Number of Children: N/A  . Years of Education: N/A   Occupational History  . Not on file.   Social History Main Topics  . Smoking status: Former Smoker -- 0.50 packs/day for 22 years    Types: Cigarettes    Quit date: 03/05/2013  . Smokeless tobacco: Never Used  . Alcohol Use: No  . Drug Use: No  . Sexual Activity:  Not on file   Other Topics Concern  . Not on file   Social History Narrative  . No narrative on file   Family History  Problem Relation Age of Onset  . Cerebral aneurysm Sister   . Hypertension Sister   . Kidney failure Sister   . Cancer Mother     uterine  . Hyperlipidemia Mother   . Diabetes Brother   . Hypertension Brother   . Diabetes Brother   . Hypertension Brother    Current Outpatient Prescriptions on File Prior to Visit  Medication Sig Dispense Refill  . amLODipine (NORVASC) 10 MG tablet Take 10 mg by mouth every morning.       Marland Kitchen. aspirin 81 MG tablet Take 81 mg by mouth every morning.       Marland Kitchen. atenolol (TENORMIN) 50 MG tablet Take 50 mg by mouth 2 (two) times daily.       . cloNIDine (CATAPRES) 0.1 MG tablet 0.1 mg 2 (two) times daily.       Marland Kitchen. glipiZIDE (GLUCOTROL) 10 MG tablet Take 10 mg by mouth 2 (two) times daily.      . hydrochlorothiazide (HYDRODIURIL) 25 MG tablet Take 25 mg by mouth every morning.      . mometasone-formoterol (DULERA) 100-5 MCG/ACT AERO Inhale 2 puffs into the lungs 2 (two) times daily.  13 g  0  . nicotine (  NICODERM CQ - DOSED IN MG/24 HOURS) 14 mg/24hr patch Place 1 patch (14 mg total) onto the skin daily.  28 patch  0  . predniSONE (DELTASONE) 10 MG tablet Take 4 tablets (40 mg) daily for 2 days, then, Take 3 tablets (30 mg) daily for 2 days, then, Take 2 tablets (20 mg) daily for 2 days, then, Take 1 tablet (10mg ) daily for 1 day and then stop  19 tablet  0  . quinapril (ACCUPRIL) 40 MG tablet Take 40 mg by mouth every morning.       . sitaGLIPtin-metformin (JANUMET) 50-500 MG per tablet Take 1 tablet by mouth 2 (two) times daily.      Marland Kitchen. tiotropium (SPIRIVA) 18 MCG inhalation capsule Place 1 capsule (18 mcg total) into inhaler and inhale daily.  30 capsule  12  . albuterol (PROVENTIL HFA;VENTOLIN HFA) 108 (90 BASE) MCG/ACT inhaler Inhale 2 puffs into the lungs every 4 (four) hours as needed for wheezing or shortness of breath.  1 Inhaler  0    . albuterol (PROVENTIL) (5 MG/ML) 0.5% nebulizer solution Take 0.5 mLs (2.5 mg total) by nebulization every 6 (six) hours as needed for wheezing or shortness of breath.  20 mL  12  . atorvastatin (LIPITOR) 40 MG tablet Take 40 mg by mouth every morning.       No current facility-administered medications on file prior to visit.   No Known Allergies   REVIEW OF SYSTEMS: Cardiovascular: No chest pain, chest pressure, palpitations, orthopnea, or dyspnea on exertion. No claudication or rest pain,  No history of DVT or phlebitis. Pulmonary: No productive cough, asthma or wheezing. Neurologic: No weakness, paresthesias, aphasia, or amaurosis. No dizziness. Hematologic: No bleeding problems or clotting disorders. Musculoskeletal: No joint pain or joint swelling. Gastrointestinal: No blood in stool or hematemesis Genitourinary: No dysuria or hematuria. Psychiatric:: No history of major depression. Integumentary: No rashes or ulcers. Constitutional: No fever or chills.   Physical Examination  Filed Vitals:   10/03/13 1003  BP: 160/88  Pulse: 68  Resp: 16  Height: 5\' 7"  (1.702 m)  Weight: 198 lb (89.812 kg)  SpO2: 98%   Body mass index is 31 kg/(m^2).  PHYSICAL EXAMINATION: General: The patient appears their stated age, obese.   HEENT:  No gross abnormalities Pulmonary: Respirations are non-labored Abdomen: Soft and non-tender with. Musculoskeletal: There are no major deformities.   Neurologic: No focal weakness or paresthesias are detected, Skin: There are no ulcer or rashes noted. Psychiatric: The patient has normal affect. Cardiovascular: There is a regular rate and rhythm without significant murmur appreciated.   Vascular: Vessel Right Left  Radial 2+Palpable 1+Palpable  Carotid  without bruit  without bruit  Popliteal Not palpable Not palpable  PT notPalpable notPalpable  DP 2+Palpable 2+Palpable    Non-Invasive Vascular Imaging  Renal Duplex (Date:  10/03/2013) RENAL ARTERY DUPLEX EVALUATION    INDICATION: Follow up stenosis    PREVIOUS INTERVENTION(S):     DUPLEX EXAM:     AORTA Peak Systolic Velocity (cm/s): -    RIGHT  LEFT   Peak Systolic Velocities (cm/s) Comments  Peak Systolic Velocities (cm/s) Comments  N/V  Renal Artery Origin/Proximal 200   61  Renal Artery Mid 286 tortuous   96  Renal Artery Distal 140   -  Accessory Renal Artery (when present) -   N/A  Renal / Aortic Ratio (RAR) -  9.9 Kidney Size (cm) 13.3  Phasic  Renal Vein Phasic    ADDITIONAL  FINDINGS: 2.0 x 2.0cm left renal cyst.    IMPRESSION: 1. Suboptimal evaluation due to extreme bowel gas and breathing artifact 2. Known atrophic right kidney. 3. Less than 60% stenosis of the visualized portions of the right renal artery. 4. Greater than 60% stenosis of the left renal artery, artery appears tortuous.    Compared to the previous exam:  Unable to obtain higher velocity as shown on prior exams.    Medical Decision Making  Amanda Booker is a 63 y.o. female who presents with:  RAS. The velocities today is less than the previous Duplex, and the last angiogram was less than 50% stenosis and angiogram is more accurate than Duplex. Recommend medical management of uncontrolled hypertension as she does not need a renal artery stent. Guidelines for diabetics recommend blood pressure less than 130/80 according to American Heart Association Scientific statement.  Based on the patient's vascular studies and examination, and after discussing with Dr. Imogene Burn, I have offered the patient: return in a year for renal artery Duplex.  I discussed in depth with the patient the nature of atherosclerosis, and emphasized the importance of maximal medical management including strict control of blood pressure, blood glucose, and lipid levels, antiplatelet agents, obtaining regular exercise, and continued cessation of smoking.  The patient is aware that without maximal medical  management the underlying atherosclerotic disease process will progress, limiting the benefit of any interventions.  Thank you for allowing [place] to participate in this patient's care.  Lorissa Kishbaugh, Carma Lair, RN, MSN, FNP-C [facility] [place] Office: [phone]  10/03/2013, 9:25 AM  Clinic MD: Imogene Burn

## 2013-12-11 IMAGING — CR DG CHEST 1V PORT
1 series · 1 of 1 positions shown · non-contrast
Comparison: No priors.

CLINICAL DATA: Hypertension.

PORTABLE CHEST - 1 VIEW

[view not recorded]
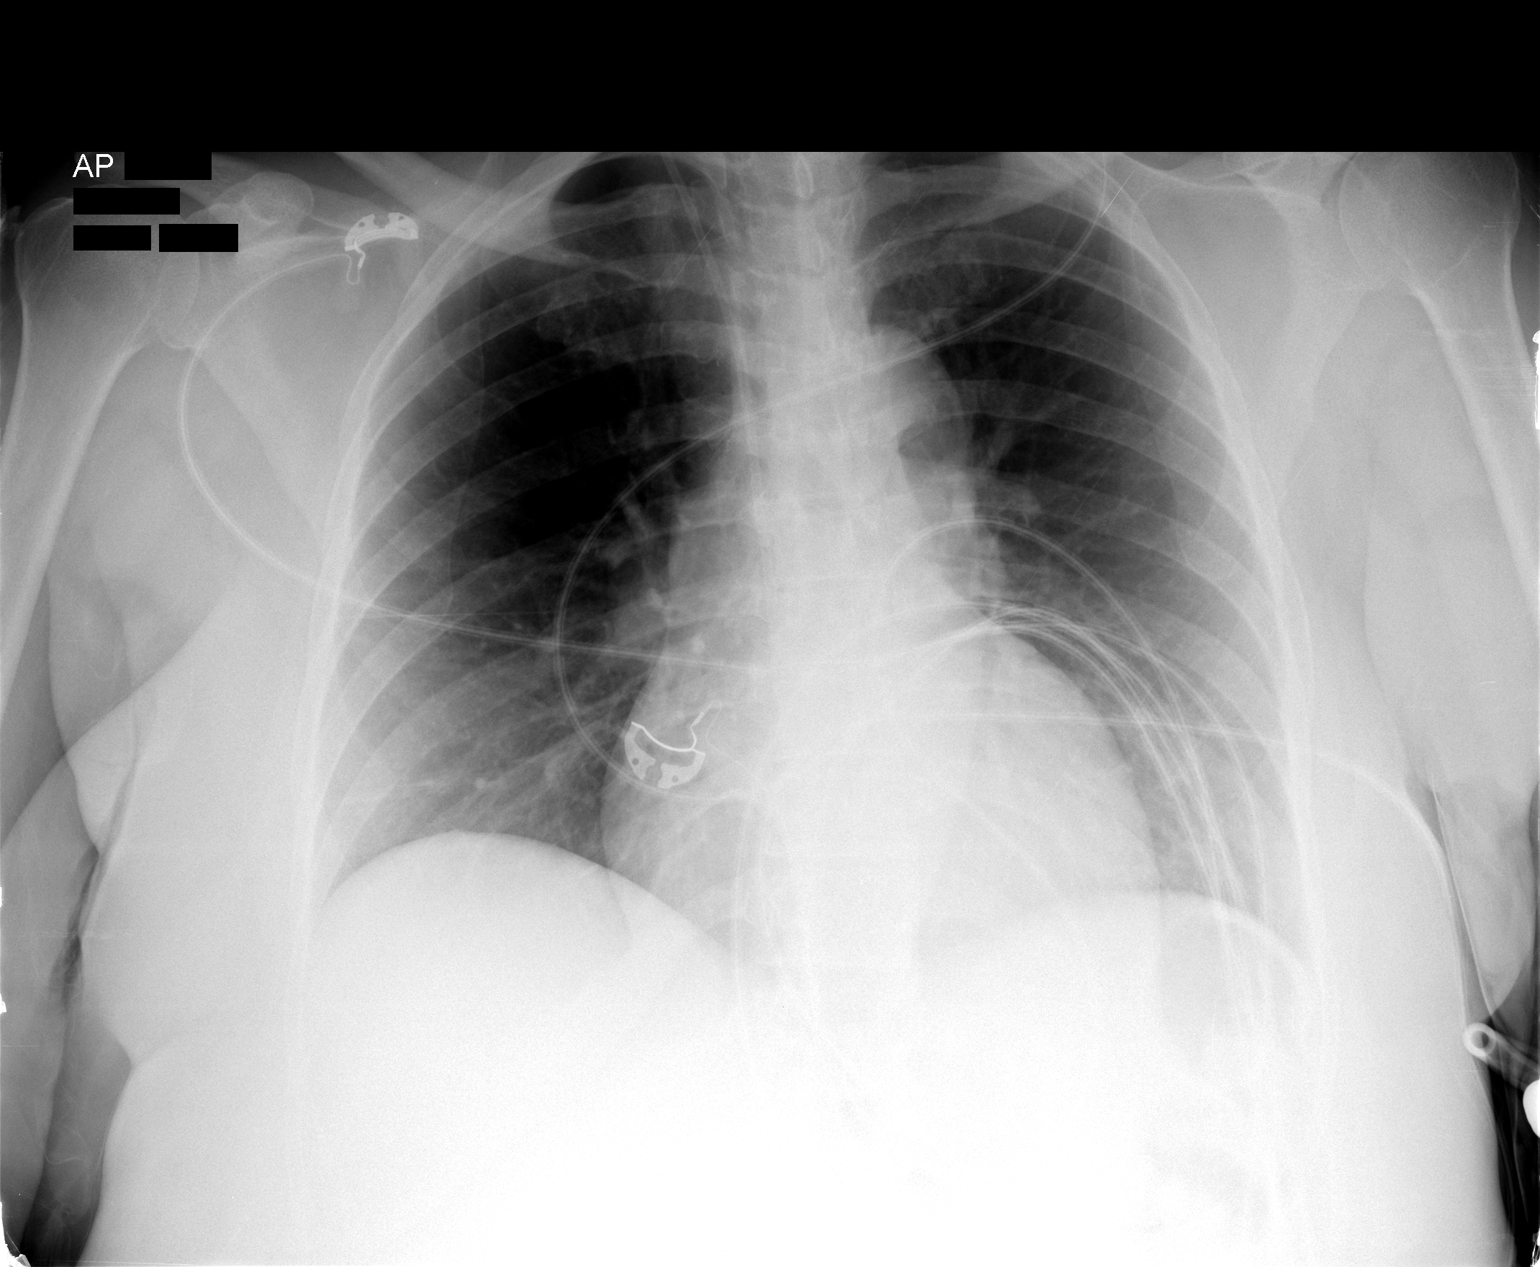

[1 of 1 positions shown; findings below may reference images not displayed]

FINDINGS: Lung volumes are normal.  No consolidative airspace
disease.  No pleural effusions.  No pneumothorax.  No pulmonary
nodule or mass noted.  Pulmonary vasculature and the
cardiomediastinal silhouette are within normal limits.  Tubing is
seen extending from the lower right cervical region into the upper
abdomen, consistent with known ventriculoperitoneal shunt catheter.
IMPRESSION: 1. No radiographic evidence of acute cardiopulmonary disease.

## 2014-01-20 IMAGING — CT CT CTA ABD/PEL W/CM AND/OR W/O CM
2 of 9 series · 10 of 46 positions shown, 15 images · IV contrast (Omnipaque 300)
Comparison: Renal ultrasound - 08/02/2011

CLINICAL DATA: Elevated hypertension, evaluate for renal artery
stenosis

CT ANGIOGRAPHY ABDOMEN AND PELVIS
TECHNIQUE: Multidetector CT imaging of the abdomen and pelvis was
performed using the standard protocol during bolus administration
of intravenous contrast.  Multiplanar reconstructed images
including MIPs were obtained and reviewed to evaluate the vascular
anatomy.
Contrast: 100mL OMNIPAQUE IOHEXOL 350 MG/ML SOLN

[Series 6: mpr cor post contrast · coronal · 0.85mm/px · 2 of 84 slices shown]
[im 28/84  soft-tissue]
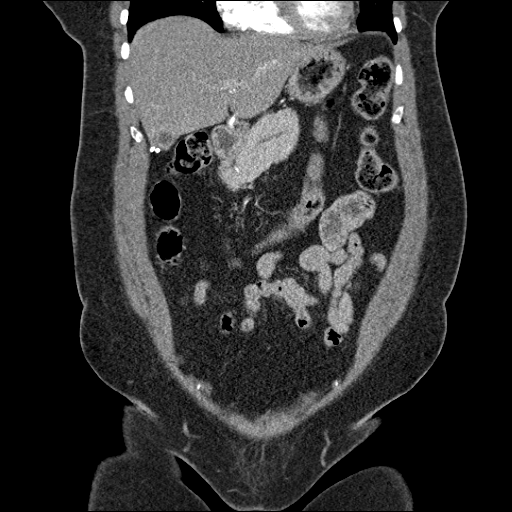
[im 56/84  soft-tissue]
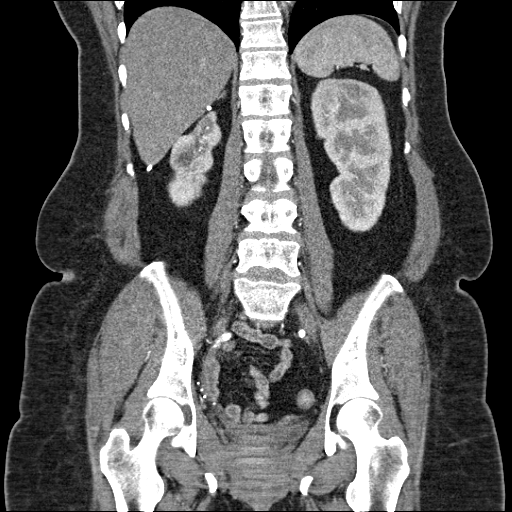

[Series 9: venous 5.0 b40f · axial · portal-venous · 0.75mm/px · z∈[+800,+1155]mm · 8 of 89 slices shown, 13 images]
[im 9/89  soft-tissue]
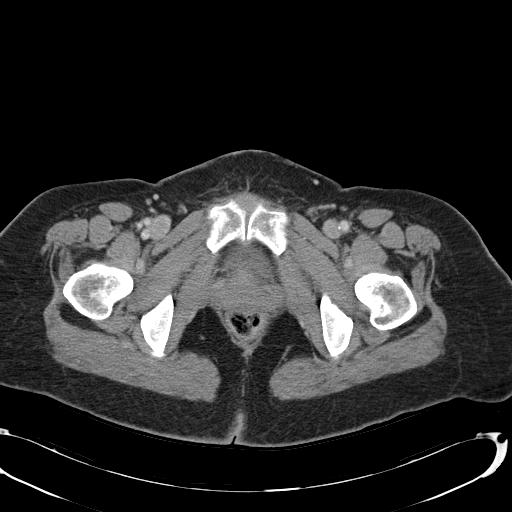
[im 9/89  bone]
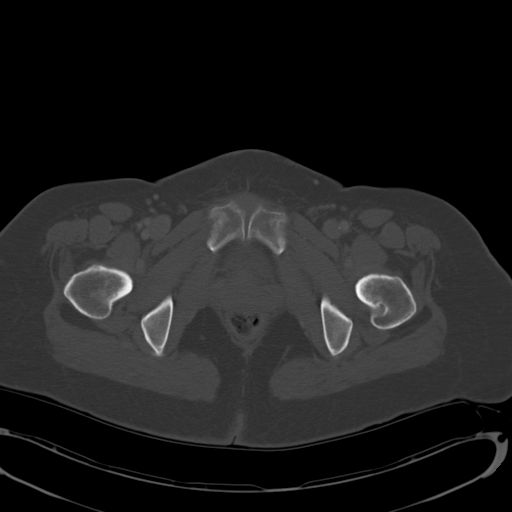
[im 18/89  soft-tissue]
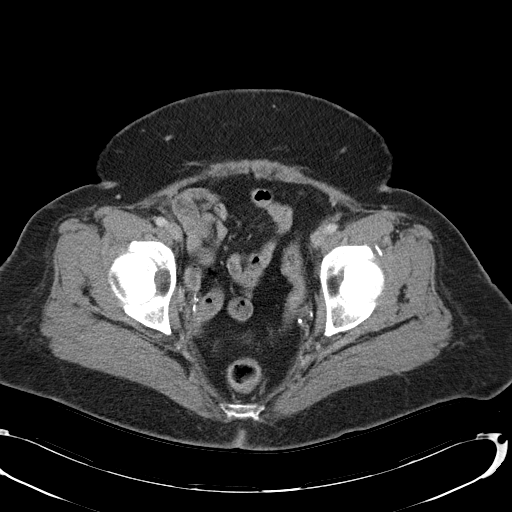
[im 27/89  soft-tissue]
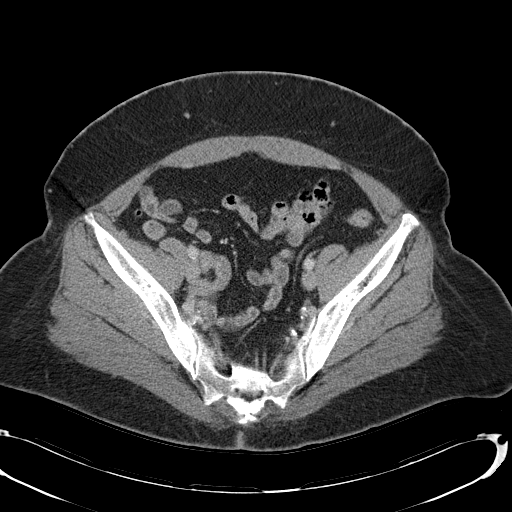
[im 36/89  soft-tissue]
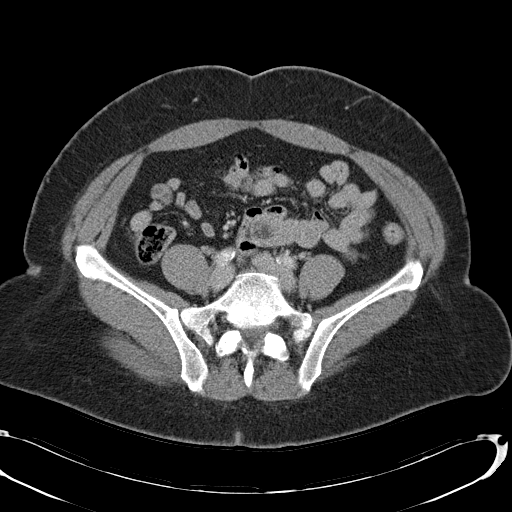
[im 53/89  soft-tissue]
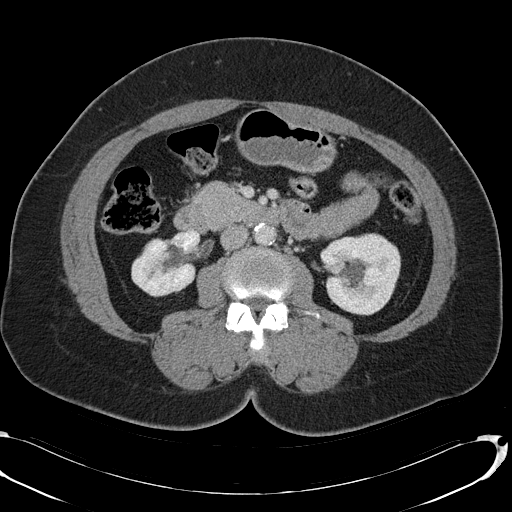
[im 53/89  lung]
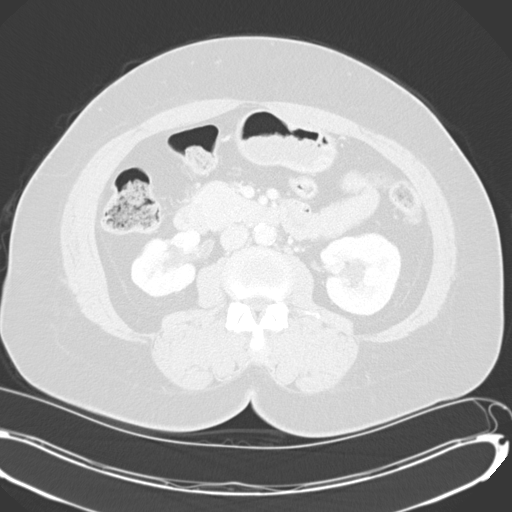
[im 62/89  soft-tissue]
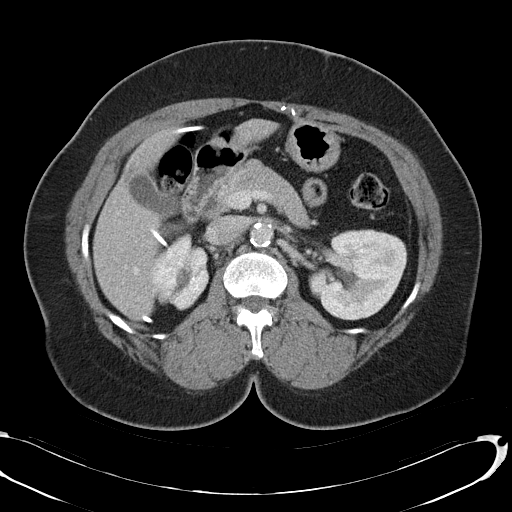
[im 62/89  lung]
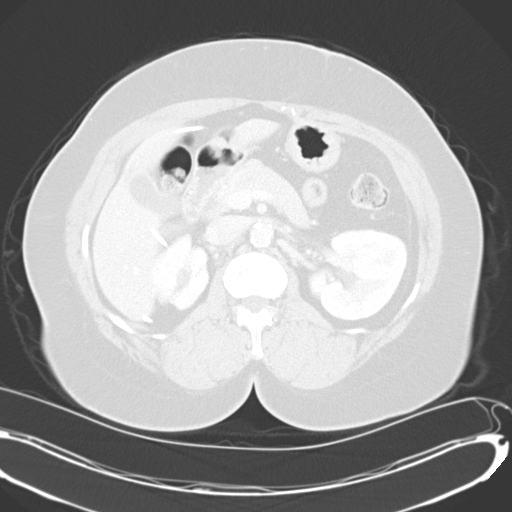
[im 71/89  soft-tissue]
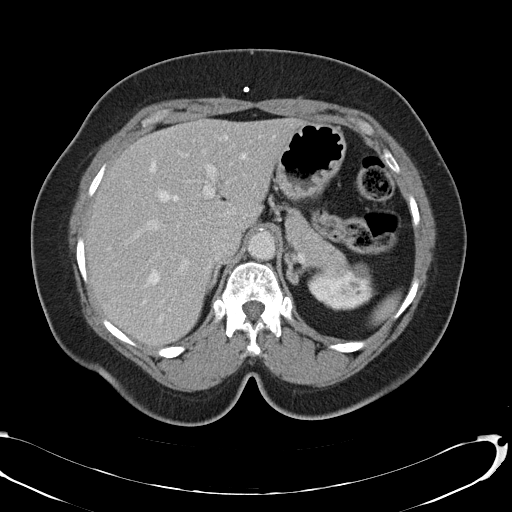
[im 71/89  lung]
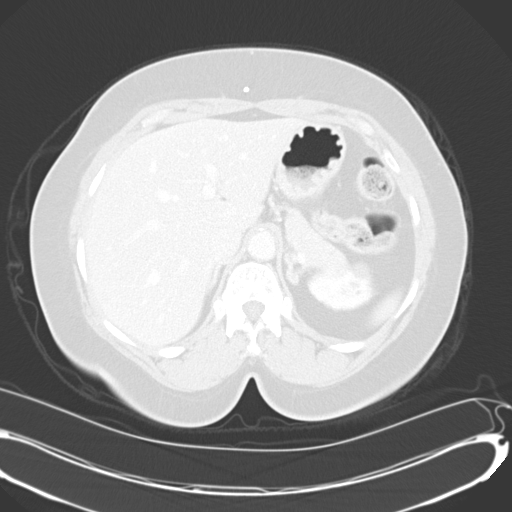
[im 80/89  soft-tissue]
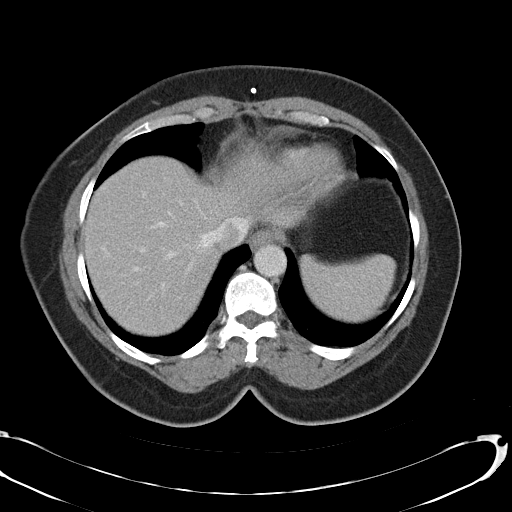
[im 80/89  lung]
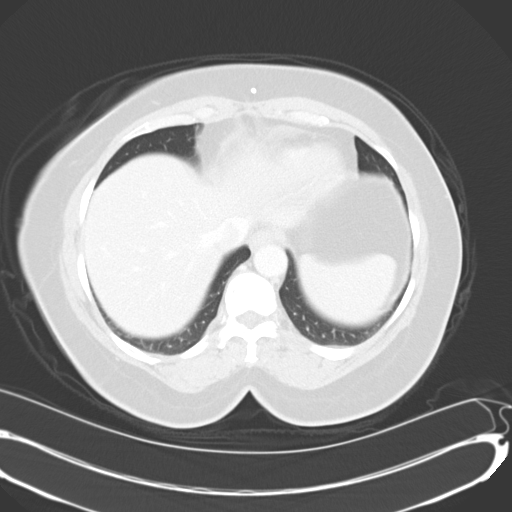

[10 of 46 positions shown; findings below may reference images not displayed]

Arterial findings:
Aorta:                  There is scattered moderate predominantly
calcified atherosclerotic plaque within the nonaneurysmal abdominal
aorta.

Celiac axis:            Widely patent.  Classical branching
pattern.

Superior mesenteric:Widely patent.  Classical branching pattern.

Left renal:             Duplicated.  Dominant left sided renal
artery has an early bifurcation and is free of hemodynamically
significant narrowing.  There is a small duplicated left renal
artery which arises inferiorly and anteriorly to the dominant left-
sided renal artery and supplies the inferior pole of the left
kidney and is also free of hemodynamically significant narrowing..

Right renal:            Duplicated. There is eccentric noncalcified
atherosclerotic plaque at the origin of the dominant right renal
artery (axial image 41, series four) which results in approximately
50% luminal narrowing.  The dominant right-sided renal artery
supplies the superior pole and mid aspect the right kidney.  There
is a diminutive duplicated right-sided renal artery which arises
inferiorly and anteriorly to the dominant right-sided renal artery
(best seen on coronal image 39, series six) which supplies the
inferior pole of the right kidney. The is diminutive but appears
free of hemodynamically significant narrowing.

Inferior mesenteric:Widely patent.

Left iliac:             There is a mixture of calcified and
noncalcified atherosclerotic plaque at the origin of the left
common iliac artery, not resulting [REDACTED] with significant
narrowing.  There is scattered minimal primarily calcified
atherosclerotic plaque within the left internal iliac artery.  The
left external iliac artery is widely patent without any significant
narrowing.

Right iliac:            There is a mixture of calcified and
noncalcified eccentric atherosclerotic plaque involving the right
common iliac artery, not resulting critically significant
narrowing.  There is predominantly calcified atherosclerotic plaque
involving the right internal iliac artery.  The right external
iliac artery is widely patent without hemodynamically significant
narrowing.

Venous findings:  Unremarkable.

 Review of the MIP images confirms the above findings.

Nonvascular findings:

Normal hepatic contour. Subtle geographic area of hypoattenuation
region of the fissure for the ligamentum teres favored to represent
focal fatty infiltration.  Ill-defined sub centimeter areas of
hyperenhancement within the peripheral aspect of the posterior
aspect the right lobe of the liver (image 24) and within the
anterior segment right lobe of the liver (image 20, series 4) due
to reperfusion or etiology.  Normal gallbladder.  No intra or
extrahepatic biliary ductal dilatation.  No ascites.
Ventriculoperitoneal tubing tip terminates just inferior to the
gallbladder fossa.  No definite evidence of pseudocyst formation.

There is symmetric enhancement and excretion of the bilateral
kidneys. Previously noted mild to moderate left-sided
hydronephrosis has resolved in the interval.  Incidental note is
made of a small left-sided extrarenal pelvis. The right kidney is
noted to be slightly atrophic in comparison to the contralateral
left kidney.  Right kidney measures approximately 9.8 cm in length
while the left measures approximately 12.8 cm.  There is evidence
of renal cortical atrophy most conspicuously involving the superior
and inferior poles of the right kidney.

Multiple non-obstructing stones are seen within the right kidney.
Index stone within the mid aspect of the right kidney measures
approximately 8 mm in diameter (coronal image series six).  There
is a solitary punctate (2-3 mm) stone within the superior pole of
the right to left kidney (image 32).  There is a hypoattenuating (9
HU) approximate 1.3 cm partially exophytic cyst arising from the
superior pole left kidney (image 20, series 9).  An additional
cortical renal cyst is noted within the mid aspect the right kidney
(image 32, series 9).  Additional bilateral sub centimeter
hypoattenuating renal lesions are too small to accurately
characterize.

The bilateral adrenal glands, pancreas and spleen are normal.

Scattered minimal colonic diverticulosis without evidence of
diverticulitis.  The bowel is otherwise normal in course and
caliber without wall thickening or evidence of obstruction.  Normal
appearance of the appendix.  No pneumoperitoneum, pneumatosis or
portal venous gas.  No retroperitoneal, mesenteric, pelvic or
inguinal lymphadenopathy.

Post hysterectomy.  No free fluid in the pelvis.

Limited visualization of the lower thorax is negative for focal
airspace opacity or pleural effusion.  Normal heart size.  No
pericardial effusion.

No acute aggressive osseous abnormalities.  Mild lumbar spine DDD
and bilateral facet degenerative change.
IMPRESSION: 1.  The right kidney is noted to be slightly atrophic in comparison
to the contralateral left kidney.  There are duplicated right sided
renal arteries, however there is focal noncalcified atherosclerotic
plaque in the proximal aspect of the dominant right sided renal
artery which appears to approach hemodynamic significance.

2.  Duplicated left-sided renal arteries without hemodynamically
significant narrowing.
3.  Moderate predominantly calcified atherosclerotic plaque within
a nonaneurysmal abdominal aorta.
4.  Multiple nonobstructing right-sided renal stones.  Solitary
nonobstructing left-sided renal stone. No urinary obstruction.

## 2014-03-16 ENCOUNTER — Other Ambulatory Visit (HOSPITAL_COMMUNITY): Payer: Self-pay | Admitting: Family Medicine

## 2014-03-16 DIAGNOSIS — Z1231 Encounter for screening mammogram for malignant neoplasm of breast: Secondary | ICD-10-CM

## 2014-03-27 ENCOUNTER — Ambulatory Visit (HOSPITAL_COMMUNITY): Payer: Medicare HMO

## 2014-04-01 ENCOUNTER — Ambulatory Visit (HOSPITAL_COMMUNITY)
Admission: RE | Admit: 2014-04-01 | Discharge: 2014-04-01 | Disposition: A | Discharge status: Home / Self Care | Payer: Medicare HMO | Source: Ambulatory Visit | Attending: Family Medicine | Admitting: Family Medicine

## 2014-04-01 DIAGNOSIS — Z1231 Encounter for screening mammogram for malignant neoplasm of breast: Secondary | ICD-10-CM | POA: Diagnosis present

## 2014-10-06 ENCOUNTER — Encounter: Payer: Self-pay | Admitting: Family

## 2014-10-07 ENCOUNTER — Other Ambulatory Visit: Payer: Self-pay | Admitting: Vascular Surgery

## 2014-10-07 DIAGNOSIS — I701 Atherosclerosis of renal artery: Secondary | ICD-10-CM

## 2014-10-09 ENCOUNTER — Inpatient Hospital Stay (HOSPITAL_COMMUNITY): Admission: RE | Admit: 2014-10-09 | Payer: Medicare HMO | Source: Ambulatory Visit

## 2014-10-09 ENCOUNTER — Ambulatory Visit: Payer: Medicare HMO | Admitting: Family

## 2015-02-01 ENCOUNTER — Other Ambulatory Visit (HOSPITAL_COMMUNITY): Payer: Self-pay | Admitting: Family Medicine

## 2015-02-01 DIAGNOSIS — Z1231 Encounter for screening mammogram for malignant neoplasm of breast: Secondary | ICD-10-CM

## 2015-02-12 LAB — HM COLONOSCOPY

## 2015-04-22 ENCOUNTER — Ambulatory Visit (HOSPITAL_COMMUNITY)
Admission: RE | Admit: 2015-04-22 | Discharge: 2015-04-22 | Disposition: A | Discharge status: Home / Self Care | Payer: Medicare HMO | Source: Ambulatory Visit | Attending: Family Medicine | Admitting: Family Medicine

## 2015-04-22 DIAGNOSIS — Z1231 Encounter for screening mammogram for malignant neoplasm of breast: Secondary | ICD-10-CM | POA: Insufficient documentation

## 2015-07-01 LAB — HM DIABETES EYE EXAM

## 2016-01-12 DIAGNOSIS — F17210 Nicotine dependence, cigarettes, uncomplicated: Secondary | ICD-10-CM | POA: Insufficient documentation

## 2016-04-07 ENCOUNTER — Other Ambulatory Visit (HOSPITAL_COMMUNITY): Payer: Self-pay | Admitting: Family Medicine

## 2016-04-07 DIAGNOSIS — Z1231 Encounter for screening mammogram for malignant neoplasm of breast: Secondary | ICD-10-CM

## 2016-04-27 ENCOUNTER — Ambulatory Visit (HOSPITAL_COMMUNITY)
Admission: RE | Admit: 2016-04-27 | Discharge: 2016-04-27 | Disposition: A | Discharge status: Home / Self Care | Payer: Medicare HMO | Source: Ambulatory Visit | Attending: Family Medicine | Admitting: Family Medicine

## 2016-04-27 DIAGNOSIS — Z1231 Encounter for screening mammogram for malignant neoplasm of breast: Secondary | ICD-10-CM | POA: Diagnosis present

## 2016-08-22 DIAGNOSIS — I69354 Hemiplegia and hemiparesis following cerebral infarction affecting left non-dominant side: Secondary | ICD-10-CM | POA: Insufficient documentation

## 2017-01-15 DIAGNOSIS — I10 Essential (primary) hypertension: Secondary | ICD-10-CM | POA: Diagnosis not present

## 2017-01-15 DIAGNOSIS — E1159 Type 2 diabetes mellitus with other circulatory complications: Secondary | ICD-10-CM | POA: Diagnosis not present

## 2017-01-15 DIAGNOSIS — E1169 Type 2 diabetes mellitus with other specified complication: Secondary | ICD-10-CM | POA: Diagnosis not present

## 2017-01-15 DIAGNOSIS — E1165 Type 2 diabetes mellitus with hyperglycemia: Secondary | ICD-10-CM | POA: Diagnosis not present

## 2017-01-15 DIAGNOSIS — E785 Hyperlipidemia, unspecified: Secondary | ICD-10-CM | POA: Diagnosis not present

## 2017-01-16 DIAGNOSIS — E785 Hyperlipidemia, unspecified: Secondary | ICD-10-CM | POA: Diagnosis not present

## 2017-01-16 DIAGNOSIS — E1165 Type 2 diabetes mellitus with hyperglycemia: Secondary | ICD-10-CM | POA: Diagnosis not present

## 2017-01-16 DIAGNOSIS — Z23 Encounter for immunization: Secondary | ICD-10-CM | POA: Diagnosis not present

## 2017-01-16 DIAGNOSIS — I10 Essential (primary) hypertension: Secondary | ICD-10-CM | POA: Diagnosis not present

## 2017-01-16 DIAGNOSIS — E1169 Type 2 diabetes mellitus with other specified complication: Secondary | ICD-10-CM | POA: Diagnosis not present

## 2017-01-16 DIAGNOSIS — E1159 Type 2 diabetes mellitus with other circulatory complications: Secondary | ICD-10-CM | POA: Diagnosis not present

## 2017-02-21 DIAGNOSIS — R69 Illness, unspecified: Secondary | ICD-10-CM | POA: Diagnosis not present

## 2017-02-26 DIAGNOSIS — E1169 Type 2 diabetes mellitus with other specified complication: Secondary | ICD-10-CM | POA: Diagnosis not present

## 2017-02-26 DIAGNOSIS — I10 Essential (primary) hypertension: Secondary | ICD-10-CM | POA: Diagnosis not present

## 2017-02-27 DIAGNOSIS — E1159 Type 2 diabetes mellitus with other circulatory complications: Secondary | ICD-10-CM | POA: Diagnosis not present

## 2017-02-27 DIAGNOSIS — I10 Essential (primary) hypertension: Secondary | ICD-10-CM | POA: Diagnosis not present

## 2017-04-30 DIAGNOSIS — I10 Essential (primary) hypertension: Secondary | ICD-10-CM | POA: Diagnosis not present

## 2017-04-30 DIAGNOSIS — Z8673 Personal history of transient ischemic attack (TIA), and cerebral infarction without residual deficits: Secondary | ICD-10-CM | POA: Diagnosis not present

## 2017-04-30 DIAGNOSIS — J449 Chronic obstructive pulmonary disease, unspecified: Secondary | ICD-10-CM | POA: Diagnosis not present

## 2017-04-30 DIAGNOSIS — E785 Hyperlipidemia, unspecified: Secondary | ICD-10-CM | POA: Diagnosis not present

## 2017-04-30 DIAGNOSIS — E1169 Type 2 diabetes mellitus with other specified complication: Secondary | ICD-10-CM | POA: Diagnosis not present

## 2017-05-04 DIAGNOSIS — I10 Essential (primary) hypertension: Secondary | ICD-10-CM | POA: Diagnosis not present

## 2017-05-04 DIAGNOSIS — Z8673 Personal history of transient ischemic attack (TIA), and cerebral infarction without residual deficits: Secondary | ICD-10-CM | POA: Diagnosis not present

## 2017-05-04 DIAGNOSIS — E785 Hyperlipidemia, unspecified: Secondary | ICD-10-CM | POA: Diagnosis not present

## 2017-05-04 DIAGNOSIS — E1159 Type 2 diabetes mellitus with other circulatory complications: Secondary | ICD-10-CM | POA: Diagnosis not present

## 2017-05-04 DIAGNOSIS — J449 Chronic obstructive pulmonary disease, unspecified: Secondary | ICD-10-CM | POA: Diagnosis not present

## 2017-05-04 DIAGNOSIS — E1169 Type 2 diabetes mellitus with other specified complication: Secondary | ICD-10-CM | POA: Diagnosis not present

## 2017-05-25 DIAGNOSIS — R69 Illness, unspecified: Secondary | ICD-10-CM | POA: Diagnosis not present

## 2017-07-02 DIAGNOSIS — E785 Hyperlipidemia, unspecified: Secondary | ICD-10-CM | POA: Diagnosis not present

## 2017-07-02 DIAGNOSIS — E1159 Type 2 diabetes mellitus with other circulatory complications: Secondary | ICD-10-CM | POA: Diagnosis not present

## 2017-07-02 DIAGNOSIS — E1169 Type 2 diabetes mellitus with other specified complication: Secondary | ICD-10-CM | POA: Diagnosis not present

## 2017-07-02 DIAGNOSIS — I10 Essential (primary) hypertension: Secondary | ICD-10-CM | POA: Diagnosis not present

## 2017-07-02 DIAGNOSIS — R69 Illness, unspecified: Secondary | ICD-10-CM | POA: Diagnosis not present

## 2017-07-02 DIAGNOSIS — J449 Chronic obstructive pulmonary disease, unspecified: Secondary | ICD-10-CM | POA: Diagnosis not present

## 2017-08-21 DIAGNOSIS — R69 Illness, unspecified: Secondary | ICD-10-CM | POA: Diagnosis not present

## 2017-09-27 DIAGNOSIS — L602 Onychogryphosis: Secondary | ICD-10-CM | POA: Diagnosis not present

## 2017-09-27 DIAGNOSIS — I10 Essential (primary) hypertension: Secondary | ICD-10-CM | POA: Diagnosis not present

## 2017-09-27 DIAGNOSIS — E1159 Type 2 diabetes mellitus with other circulatory complications: Secondary | ICD-10-CM | POA: Diagnosis not present

## 2017-09-27 DIAGNOSIS — J449 Chronic obstructive pulmonary disease, unspecified: Secondary | ICD-10-CM | POA: Diagnosis not present

## 2017-09-27 DIAGNOSIS — E1169 Type 2 diabetes mellitus with other specified complication: Secondary | ICD-10-CM | POA: Diagnosis not present

## 2017-09-27 DIAGNOSIS — R69 Illness, unspecified: Secondary | ICD-10-CM | POA: Diagnosis not present

## 2017-09-27 DIAGNOSIS — E785 Hyperlipidemia, unspecified: Secondary | ICD-10-CM | POA: Diagnosis not present

## 2017-11-19 DIAGNOSIS — R69 Illness, unspecified: Secondary | ICD-10-CM | POA: Diagnosis not present

## 2017-12-27 DIAGNOSIS — R69 Illness, unspecified: Secondary | ICD-10-CM | POA: Diagnosis not present

## 2017-12-27 DIAGNOSIS — E1169 Type 2 diabetes mellitus with other specified complication: Secondary | ICD-10-CM | POA: Diagnosis not present

## 2017-12-27 DIAGNOSIS — J449 Chronic obstructive pulmonary disease, unspecified: Secondary | ICD-10-CM | POA: Diagnosis not present

## 2017-12-27 DIAGNOSIS — Z1231 Encounter for screening mammogram for malignant neoplasm of breast: Secondary | ICD-10-CM | POA: Diagnosis not present

## 2017-12-27 DIAGNOSIS — E785 Hyperlipidemia, unspecified: Secondary | ICD-10-CM | POA: Diagnosis not present

## 2017-12-27 DIAGNOSIS — L602 Onychogryphosis: Secondary | ICD-10-CM | POA: Diagnosis not present

## 2017-12-27 DIAGNOSIS — E1159 Type 2 diabetes mellitus with other circulatory complications: Secondary | ICD-10-CM | POA: Diagnosis not present

## 2017-12-27 DIAGNOSIS — I10 Essential (primary) hypertension: Secondary | ICD-10-CM | POA: Diagnosis not present

## 2018-02-14 DIAGNOSIS — Z23 Encounter for immunization: Secondary | ICD-10-CM | POA: Diagnosis not present

## 2018-02-23 DIAGNOSIS — R69 Illness, unspecified: Secondary | ICD-10-CM | POA: Diagnosis not present

## 2018-03-26 DIAGNOSIS — I10 Essential (primary) hypertension: Secondary | ICD-10-CM | POA: Diagnosis not present

## 2018-03-26 DIAGNOSIS — E1159 Type 2 diabetes mellitus with other circulatory complications: Secondary | ICD-10-CM | POA: Diagnosis not present

## 2018-03-28 DIAGNOSIS — E785 Hyperlipidemia, unspecified: Secondary | ICD-10-CM | POA: Diagnosis not present

## 2018-03-28 DIAGNOSIS — Z23 Encounter for immunization: Secondary | ICD-10-CM | POA: Diagnosis not present

## 2018-03-28 DIAGNOSIS — E1169 Type 2 diabetes mellitus with other specified complication: Secondary | ICD-10-CM | POA: Diagnosis not present

## 2018-03-28 DIAGNOSIS — J449 Chronic obstructive pulmonary disease, unspecified: Secondary | ICD-10-CM | POA: Diagnosis not present

## 2018-03-28 DIAGNOSIS — E1159 Type 2 diabetes mellitus with other circulatory complications: Secondary | ICD-10-CM | POA: Diagnosis not present

## 2018-03-28 DIAGNOSIS — I10 Essential (primary) hypertension: Secondary | ICD-10-CM | POA: Diagnosis not present

## 2018-03-28 DIAGNOSIS — L602 Onychogryphosis: Secondary | ICD-10-CM | POA: Diagnosis not present

## 2018-04-02 DIAGNOSIS — R69 Illness, unspecified: Secondary | ICD-10-CM | POA: Diagnosis not present

## 2018-04-26 DIAGNOSIS — E1159 Type 2 diabetes mellitus with other circulatory complications: Secondary | ICD-10-CM | POA: Diagnosis not present

## 2018-04-26 DIAGNOSIS — I10 Essential (primary) hypertension: Secondary | ICD-10-CM | POA: Diagnosis not present

## 2018-05-26 DIAGNOSIS — R69 Illness, unspecified: Secondary | ICD-10-CM | POA: Diagnosis not present

## 2018-06-27 ENCOUNTER — Other Ambulatory Visit (HOSPITAL_COMMUNITY): Payer: Self-pay | Admitting: Family Medicine

## 2018-06-27 DIAGNOSIS — Z780 Asymptomatic menopausal state: Secondary | ICD-10-CM

## 2018-06-27 DIAGNOSIS — E785 Hyperlipidemia, unspecified: Secondary | ICD-10-CM | POA: Diagnosis not present

## 2018-06-27 DIAGNOSIS — Z1231 Encounter for screening mammogram for malignant neoplasm of breast: Secondary | ICD-10-CM

## 2018-06-27 DIAGNOSIS — I10 Essential (primary) hypertension: Secondary | ICD-10-CM | POA: Diagnosis not present

## 2018-06-27 DIAGNOSIS — J449 Chronic obstructive pulmonary disease, unspecified: Secondary | ICD-10-CM | POA: Diagnosis not present

## 2018-06-27 DIAGNOSIS — Z1239 Encounter for other screening for malignant neoplasm of breast: Secondary | ICD-10-CM | POA: Diagnosis not present

## 2018-06-27 DIAGNOSIS — E1159 Type 2 diabetes mellitus with other circulatory complications: Secondary | ICD-10-CM | POA: Diagnosis not present

## 2018-06-27 DIAGNOSIS — E1169 Type 2 diabetes mellitus with other specified complication: Secondary | ICD-10-CM | POA: Diagnosis not present

## 2018-07-11 ENCOUNTER — Ambulatory Visit (HOSPITAL_COMMUNITY)
Admission: RE | Admit: 2018-07-11 | Discharge: 2018-07-11 | Disposition: A | Discharge status: Home / Self Care | Payer: Medicare HMO | Source: Ambulatory Visit | Attending: Family Medicine | Admitting: Family Medicine

## 2018-07-11 ENCOUNTER — Other Ambulatory Visit: Payer: Self-pay

## 2018-07-11 DIAGNOSIS — Z1231 Encounter for screening mammogram for malignant neoplasm of breast: Secondary | ICD-10-CM

## 2018-07-11 DIAGNOSIS — Z780 Asymptomatic menopausal state: Secondary | ICD-10-CM | POA: Insufficient documentation

## 2018-07-13 DIAGNOSIS — R69 Illness, unspecified: Secondary | ICD-10-CM | POA: Diagnosis not present

## 2018-07-22 DIAGNOSIS — R69 Illness, unspecified: Secondary | ICD-10-CM | POA: Diagnosis not present

## 2018-09-02 DIAGNOSIS — R69 Illness, unspecified: Secondary | ICD-10-CM | POA: Diagnosis not present

## 2018-10-22 DIAGNOSIS — R69 Illness, unspecified: Secondary | ICD-10-CM | POA: Diagnosis not present

## 2018-12-11 ENCOUNTER — Other Ambulatory Visit: Payer: Self-pay

## 2018-12-11 ENCOUNTER — Ambulatory Visit (INDEPENDENT_AMBULATORY_CARE_PROVIDER_SITE_OTHER): Payer: Medicare HMO | Admitting: Family Medicine

## 2018-12-11 ENCOUNTER — Encounter: Payer: Self-pay | Admitting: Family Medicine

## 2018-12-11 VITALS — BP 152/74 | HR 72 | Temp 98.7°F | Ht 67.0 in | Wt 175.0 lb

## 2018-12-11 DIAGNOSIS — I6990 Unspecified sequelae of unspecified cerebrovascular disease: Secondary | ICD-10-CM

## 2018-12-11 DIAGNOSIS — Z7689 Persons encountering health services in other specified circumstances: Secondary | ICD-10-CM

## 2018-12-11 DIAGNOSIS — I10 Essential (primary) hypertension: Secondary | ICD-10-CM

## 2018-12-11 DIAGNOSIS — E1159 Type 2 diabetes mellitus with other circulatory complications: Secondary | ICD-10-CM

## 2018-12-11 DIAGNOSIS — F17210 Nicotine dependence, cigarettes, uncomplicated: Secondary | ICD-10-CM

## 2018-12-11 DIAGNOSIS — R69 Illness, unspecified: Secondary | ICD-10-CM | POA: Diagnosis not present

## 2018-12-11 DIAGNOSIS — J449 Chronic obstructive pulmonary disease, unspecified: Secondary | ICD-10-CM | POA: Diagnosis not present

## 2018-12-11 DIAGNOSIS — E1169 Type 2 diabetes mellitus with other specified complication: Secondary | ICD-10-CM | POA: Diagnosis not present

## 2018-12-11 DIAGNOSIS — I69354 Hemiplegia and hemiparesis following cerebral infarction affecting left non-dominant side: Secondary | ICD-10-CM

## 2018-12-11 DIAGNOSIS — E785 Hyperlipidemia, unspecified: Secondary | ICD-10-CM

## 2018-12-11 LAB — BAYER DCA HB A1C WAIVED: HB A1C (BAYER DCA - WAIVED): 8.1 % — ABNORMAL HIGH (ref <7.0)

## 2018-12-11 MED ORDER — QUINAPRIL HCL 40 MG PO TABS: 40.0000 mg | ORAL_TABLET | Freq: Every morning | ORAL | 3 refills | Status: DC

## 2018-12-11 MED ORDER — AMLODIPINE BESYLATE 10 MG PO TABS: 10.0000 mg | ORAL_TABLET | Freq: Every morning | ORAL | 3 refills | Status: DC

## 2018-12-11 MED ORDER — FLUTICASONE-SALMETEROL 45-21 MCG/ACT IN AERO: 2.0000 | INHALATION_SPRAY | Freq: Two times a day (BID) | RESPIRATORY_TRACT | 12 refills | Status: DC

## 2018-12-11 MED ORDER — CLONIDINE HCL 0.3 MG PO TABS: 0.3000 mg | ORAL_TABLET | Freq: Two times a day (BID) | ORAL | 3 refills | Status: DC

## 2018-12-11 MED ORDER — HYDROCHLOROTHIAZIDE 25 MG PO TABS: 25.0000 mg | ORAL_TABLET | Freq: Every morning | ORAL | 3 refills | Status: DC

## 2018-12-11 MED ORDER — METFORMIN HCL ER 500 MG PO TB24: 500.0000 mg | ORAL_TABLET | Freq: Two times a day (BID) | ORAL | 3 refills | Status: DC

## 2018-12-11 MED ORDER — METOPROLOL SUCCINATE ER 50 MG PO TB24: 75.0000 mg | ORAL_TABLET | Freq: Every day | ORAL | 3 refills | Status: DC

## 2018-12-11 NOTE — Progress Notes (Signed)
Subjective:  Patient ID: Amanda Booker, female    DOB: 1950-11-01, 68 y.o.   MRN: [id number]  Patient Care Team: Baruch Gouty, FNP as PCP - General (Family Medicine)   Chief Complaint:  Establish Care ([place]), Hyperlipidemia, Hypertension, and Diabetes   HPI: Amanda Booker is a 68 y.o. female presenting on 12/11/2018 for Establish Care ([place]), Hyperlipidemia, Hypertension, and Diabetes   1. Encounter to establish care  Pt presents today to establish care. Pt was seeing Dr. Edrick Oh who has retired. Pt last saw him in march. She reports she has been doing fairly well. Her family is with her today who assists with her care.    2. Hypertension associated with diabetes ([place])  Not well controlled. Does not check on a daily basis. She denies chest pain, headaches, visual changes, leg swelling, confusion, or increased weakness. Does take medications on a regular basis. No adverse side effects. No decreased urine output. Does not diet or exercise.    3. DM type 2 with diabetic dyslipidemia ([place])  Compliant with medications without adverse side effects. Does not watch diet or exercise on a regular basis. Last A1C 7.8 in March 2020. No polyuria, polydipsia, or polyphagia. Does check blood sugar on a regular basis, averaging 150-200 per pt. Denies recent change in medication regimen.    4. Hyperlipidemia associated with type 2 diabetes mellitus ([place])  Complaint with atorvastatin without associated side effects. Does not watch diet or exercise on a regular basis.    5. Cigarette nicotine dependence without complication  Has cut back to 0.5-.075 PPD. Tried nicotine patches without success. Does not wish to address today.    6. 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 This was placed after the hemorrhagic stroke. Has been revised once since initial placement.  Followed by neurovascular surgery at [facility]. Denies new symptoms.    7. COPD (chronic obstructive pulmonary disease) with chronic bronchitis (HCC)  Pt states she has a daily cough. Productive at times. No increased shortness of breath, orthopnea, fatigue, confusion, weakness, fever, or chills. Pt states she is only prescribed albuterol nebulizer as needed for symptoms. She states she has never been on  daily controller medications. She does have daily symptoms. The symptoms do not limit her activity but are exacerbated by exertion.      Relevant past medical, surgical, family, and social history reviewed and updated as indicated.  Allergies and medications reviewed and updated. Date reviewed: Chart in Epic.   Past Medical History:  Diagnosis Date  . Cerebral aneurysm rupture ([place])   . Cerebral ventricular shunt fitting or adjustment   . Chronic kidney disease   . Coagulopathy ([place])   . Diabetes mellitus   . Hypertension   . Hypokalemia   . Kidney stone   . Renal artery stenosis ([place])   . Renal artery stenosis [facility])     Past Surgical History:  Procedure Laterality Date  . ABDOMINAL HYSTERECTOMY  2007  . cerebral shunt      Social History   Socioeconomic History  . Marital status: Widowed    Spouse name: Not on file  . Number of children: 3  . Years of education: Not on file  . Highest education level: Not on file  Occupational History  . Not on file  Social Needs  . Financial resource strain: Not on file  . Food insecurity  Worry: Not on file    Inability: Not on file  . Transportation needs    Medical: Not on file    Non-medical: Not on file  Tobacco Use  . Smoking status: Current Every Day Smoker    Packs/day: 0.50    Types: Cigarettes  . Smokeless tobacco: Never Used  Substance and Sexual Activity  . Alcohol use: No  . Drug use: No  . Sexual activity: Not on file  Lifestyle  . Physical activity    Days per week: Not on file    Minutes per session: Not on  file  . Stress: Not on file  Relationships  . Social Herbalist on phone: Not on file    Gets together: Not on file    Attends religious service: Not on file    Active member of club or organization: Not on file    Attends meetings of clubs or organizations: Not on file    Relationship status: Not on file  . Intimate partner violence    Fear of current or ex partner: Not on file    Emotionally abused: Not on file    Physically abused: Not on file    Forced sexual activity: Not on file  Other Topics Concern  . Not on file  Social History Narrative  . Not on file    Outpatient Encounter Medications as of 12/11/2018  Medication Sig  . albuterol (PROVENTIL) (5 MG/ML) 0.5% nebulizer solution Take 0.5 mLs (2.5 mg total) by nebulization every 6 (six) hours as needed for wheezing or shortness of breath.  Marland Kitchen amLODipine (NORVASC) 10 MG tablet Take 1 tablet (10 mg total) by mouth every morning.  Marland Kitchen aspirin 81 MG tablet Take 81 mg by mouth every morning.   Marland Kitchen atorvastatin (LIPITOR) 40 MG tablet Take 40 mg by mouth every morning.  Marland Kitchen glipiZIDE (GLUCOTROL XL) 5 MG 24 hr tablet Take 2 tablets by mouth 2 (two) times daily.  . hydrochlorothiazide (HYDRODIURIL) 25 MG tablet Take 1 tablet (25 mg total) by mouth every morning.  . quinapril (ACCUPRIL) 40 MG tablet Take 1 tablet (40 mg total) by mouth every morning.  . [DISCONTINUED] amLODipine (NORVASC) 10 MG tablet Take 10 mg by mouth every morning.   . [DISCONTINUED] cloNIDine (CATAPRES) 0.1 MG tablet 0.2 mg 2 (two) times daily.   . [DISCONTINUED] hydrochlorothiazide (HYDRODIURIL) 25 MG tablet Take 25 mg by mouth every morning.  . [DISCONTINUED] metFORMIN (GLUCOPHAGE-XR) 500 MG 24 hr tablet 1,000 mg 2 (two) times daily.   . [DISCONTINUED] quinapril (ACCUPRIL) 40 MG tablet Take 40 mg by mouth every morning.   . cloNIDine (CATAPRES) 0.3 MG tablet Take 1 tablet (0.3 mg total) by mouth 2 (two) times daily.  . fluticasone-salmeterol (ADVAIR HFA)  45-21 MCG/ACT inhaler Inhale 2 puffs into the lungs 2 (two) times daily.  . metFORMIN (GLUCOPHAGE-XR) 500 MG 24 hr tablet Take 1 tablet (500 mg total) by mouth 2 (two) times daily.  . metoprolol succinate (TOPROL-XL) 50 MG 24 hr tablet Take 2 tablets (100 mg total) by mouth daily.  . [DISCONTINUED] albuterol (PROVENTIL HFA;VENTOLIN HFA) 108 (90 BASE) MCG/ACT inhaler Inhale 2 puffs into the lungs every 4 (four) hours as needed for wheezing or shortness of breath.  . [DISCONTINUED] atenolol (TENORMIN) 50 MG tablet Take 50 mg by mouth 2 (two) times daily.   . [DISCONTINUED] glipiZIDE (GLUCOTROL) 10 MG tablet Take 10 mg by mouth 2 (two) times daily.  . [DISCONTINUED] JANUVIA 100 MG  tablet 2 (two) times daily.  . [DISCONTINUED] metoprolol succinate (TOPROL-XL) 50 MG 24 hr tablet Take 1.5 tablets by mouth daily.  . [DISCONTINUED] mometasone-formoterol (DULERA) 100-5 MCG/ACT AERO Inhale 2 puffs into the lungs 2 (two) times daily.  . [DISCONTINUED] nicotine (NICODERM CQ - DOSED IN MG/24 HOURS) 14 mg/24hr patch Place 1 patch (14 mg total) onto the skin daily.  . [DISCONTINUED] predniSONE (DELTASONE) 10 MG tablet Take 4 tablets (40 mg) daily for 2 days, then, Take 3 tablets (30 mg) daily for 2 days, then, Take 2 tablets (20 mg) daily for 2 days, then, Take 1 tablet (52m) daily for 1 day and then stop  . [DISCONTINUED] sitaGLIPtin-metformin (JANUMET) 50-500 MG per tablet Take 1 tablet by mouth 2 (two) times daily.  . [DISCONTINUED] tiotropium (SPIRIVA) 18 MCG inhalation capsule Place 1 capsule (18 mcg total) into inhaler and inhale daily.   No facility-administered encounter medications on file as of 12/11/2018.     No Known Allergies  Review of Systems  Constitutional: Negative for activity change, appetite change, chills, fatigue and fever.  HENT: Negative.   Eyes: Negative.  Negative for photophobia and visual disturbance.  Respiratory: Positive for cough, shortness of breath (exertional, baseline)  and wheezing. Negative for chest tightness.   Cardiovascular: Negative for chest pain, palpitations and leg swelling.  Gastrointestinal: Negative for abdominal pain, blood in stool, constipation, diarrhea, nausea and vomiting.  Endocrine: Negative.  Negative for cold intolerance, heat intolerance, polydipsia, polyphagia and polyuria.  Genitourinary: Negative for decreased urine volume, difficulty urinating, dysuria, frequency and urgency.  Musculoskeletal: Positive for gait problem (due to previous CVA, has adapted well ). Negative for arthralgias and myalgias.  Skin: Negative.  Negative for color change, pallor, rash and wound.  Allergic/Immunologic: Negative.   Neurological: Positive for weakness (left sided from previous CVA, no change from baseline). Negative for dizziness, tremors, seizures, syncope, facial asymmetry, speech difficulty, light-headedness, numbness and headaches.  Hematological: Negative.  Negative for adenopathy. Does not bruise/bleed easily.  Psychiatric/Behavioral: Negative for agitation, behavioral problems, confusion, decreased concentration, dysphoric mood, hallucinations, self-injury, sleep disturbance and suicidal ideas. The patient is not nervous/anxious and is not hyperactive.   All other systems reviewed and are negative.       Objective:  BP (!) 152/74   Pulse 72   Temp 98.7 F (37.1 C)   Ht 5' 7"  (1.702 m)   Wt 175 lb (79.4 kg)   BMI 27.41 kg/m    Wt Readings from Last 3 Encounters:  12/11/18 175 lb (79.4 kg)  10/03/13 198 lb (89.8 kg)  03/27/13 185 lb 13.6 oz (84.3 kg)    Physical Exam Vitals signs and nursing note reviewed.  Constitutional:      General: She is not in acute distress.    Appearance: Normal appearance. She is well-developed, well-groomed and overweight. She is not ill-appearing, toxic-appearing or diaphoretic.  HENT:     Head: Normocephalic and atraumatic.     Jaw: There is normal jaw occlusion.     Right Ear: Hearing, tympanic  membrane, ear canal and external ear normal.     Left Ear: Hearing, tympanic membrane, ear canal and external ear normal.     Nose: Nose normal.     Mouth/Throat:     Lips: Pink.     Mouth: Mucous membranes are moist.     Pharynx: Oropharynx is clear. Uvula midline.  Eyes:     General: Lids are normal.     Extraocular Movements: Extraocular movements intact.  Conjunctiva/sclera: Conjunctivae normal.     Pupils: Pupils are equal, round, and reactive to light.  Neck:     Musculoskeletal: Normal range of motion and neck supple.     Thyroid: No thyroid mass, thyromegaly or thyroid tenderness.     Vascular: No carotid bruit or JVD.     Trachea: Trachea and phonation normal.  Cardiovascular:     Rate and Rhythm: Normal rate and regular rhythm.     Chest Wall: PMI is not displaced.     Pulses: Normal pulses.     Heart sounds: Normal heart sounds. No murmur. No friction rub. No gallop.   Pulmonary:     Effort: Pulmonary effort is normal. No respiratory distress.     Breath sounds: Examination of the right-lower field reveals wheezing. Examination of the left-lower field reveals wheezing. Wheezing (minimal in bases) and rhonchi (clears with coughing) present. No decreased breath sounds or rales.  Abdominal:     General: Bowel sounds are normal. There is no distension or abdominal bruit.     Palpations: Abdomen is soft. There is no hepatomegaly or splenomegaly.     Tenderness: There is no abdominal tenderness. There is no right CVA tenderness or left CVA tenderness.     Hernia: No hernia is present.  Musculoskeletal: Normal range of motion.     Right lower leg: No edema.     Left lower leg: No edema.  Lymphadenopathy:     Cervical: No cervical adenopathy.  Skin:    General: Skin is warm and dry.     Capillary Refill: Capillary refill takes less than 2 seconds.     Coloration: Skin is not cyanotic, jaundiced or pale.     Findings: No rash.  Neurological:     Mental Status: She is  alert and oriented to person, place, and time. Mental status is at baseline.     Cranial Nerves: Cranial nerves are intact. No cranial nerve deficit.     Sensory: Sensation is intact. No sensory deficit.     Motor: Weakness (left hemiparesis from previous CVA, baseline) present.     Coordination: Coordination is intact.     Gait: Gait abnormal (due to previous CVA).     Deep Tendon Reflexes: Reflexes are normal and symmetric.  Psychiatric:        Attention and Perception: Attention and perception normal.        Mood and Affect: Mood and affect normal.        Speech: Speech normal.        Behavior: Behavior normal. Behavior is cooperative.        Thought Content: Thought content normal.        Cognition and Memory: Cognition and memory normal.        Judgment: Judgment normal.     Results for orders placed or performed in visit on 12/11/18  Bayer DCA Hb A1c Waived  Result Value Ref Range   HB A1C (BAYER DCA - WAIVED) 8.1 (H) <7.0 %       Pertinent labs & imaging results that were available during my care of the patient were reviewed by me and considered in my medical decision making.  Assessment & Plan:  Amanda Booker was seen today for establish care, hyperlipidemia, hypertension and diabetes.  Diagnoses and all orders for this visit:  Encounter to establish care  Hypertension associated with diabetes ([place]) BP poorly controlled. Changes were made in regimen today, will increase clonidine to 0.3 mg twice daily, will  continue all other medications as prescribed. Daily blood pressure log given with instructions on how to fill out and told to bring to next visit. Ga[place] BP 130/80. Pt aware to report any persistent high or low readings. DASH diet and exercise encouraged. Exercise at least 150 minutes per week and increase as tolerated. Goal BMI > 25. Stress management encouraged. Smoking cessation discussed. Avoid excessive alcohol. Avoid NSAID's. Avoid more than 2000 mg of sodium daily.  Medications as prescribed. Follow up as scheduled.  -     CMP14+EGFR -     CBC with Differential/Platelet -     Microalbumin / creatinine urine ratio -     Thyroid Panel With TSH -     cloNIDine (CATAPRES) 0.3 MG tablet; Take 1 tablet (0.3 mg total) by mouth 2 (two) times daily. -     metoprolol succinate (TOPROL-XL) 50 MG 24 hr tablet; Take 2 tablets (100 mg total) by mouth daily. -     hydrochlorothiazide (HYDRODIURIL) 25 MG tablet; Take 1 tablet (25 mg total) by mouth every morning. -     amLODipine (NORVASC) 10 MG tablet; Take 1 tablet (10 mg total) by mouth every morning. -     quinapril (ACCUPRIL) 40 MG tablet; Take 1 tablet (40 mg total) by mouth every morning.  DM type 2 with diabetic dyslipidemia (HCC) A1C 8.1 today. Pt will work on diet and exercise. Will reevaluate in 3 months and adjust therapy if warranted. Other labs pending. Diet and exercise discussed. Follow up in 3 months.  -     CMP14+EGFR -     Microalbumin / creatinine urine ratio -     Lipid panel -     Bayer DCA Hb A1c Waived -     metFORMIN (GLUCOPHAGE-XR) 500 MG 24 hr tablet; Take 1 tablet (500 mg total) by mouth 2 (two) times daily.  Hyperlipidemia associated with type 2 diabetes mellitus ([place]) Diet encouraged - increase intake of fresh fruits and vegetables, increase intake of lean proteins. Bake, broil, or grill foods. Avoid fried, greasy, and fatty foods. Avoid fast foods. Increase intake of fiber-rich whole grains.  Exercise encouraged - at least 150 minutes per week and advance as tolerated.  Goal BMI < 25. Continue medications as prescribed. Follow up in 3-6 months as discussed.  -     Lipid panel  Cigarette nicotine dependence without complication Smoking cessation discussed. Has cut back to 1/2 - 3/4 PPD from 1 1/2 PPD. Was using nicotine patches without success. Does not desire additional therapy at this time.   Late effects of CVA (cerebrovascular accident), left hemiparesis ([place]) Ongoing left sided  weakness from previous CVA. Pt has VP shunt in place. Followed [facility] at [facility].   COPD (chronic obstructive pulmonary disease) with chronic bronchitis (HCC) Will initiate daily Advair due to daily symptoms and daily need for nebulizer use. She denies previous controller therapy. Will trial Advair. Report any new, worsening, or persistent symptoms. Smoking cessation encouraged.  -     fluticasone-salmeterol (ADVAIR HFA) 45-21 MCG/ACT inhaler; Inhale 2 puffs into the lungs 2 (two) times daily.     Continue all other maintenance medications.  Follow up plan: Return in about 3 months (around 03/13/2019), or if symptoms worsen or fail to improve, for DM, HTN.  Continue healthy lifestyle choices, including diet (rich in fruits, vegetables, and lean proteins, and low in salt and simple carbohydrates) and exercise (at least 30 minutes of moderate physical activity daily).  Educational handout  given for DM  The above assessment and management plan was discussed with the patient. The patient verbalized understanding of and has agreed to the management plan. Patient is aware to call the clinic if symptoms persist or worsen. Patient is aware when to return to the clinic for a follow-up visit. Patient educated on when it is appropriate to go to the emergency department.   Total time spent with patient 60 minutes.  Greater than 50% of encounter spent in coordination of care/counseling.  Monia Pouch, FNP-C [facility] [phone] 12/11/18

## 2018-12-11 NOTE — Patient Instructions (Signed)

## 2018-12-12 LAB — CMP14+EGFR
ALT: 13 IU/L (ref 0–32)
AST: 14 IU/L (ref 0–40)
Albumin/Globulin Ratio: 2 (ref 1.2–2.2)
Albumin: 4.9 g/dL — ABNORMAL HIGH (ref 3.8–4.8)
Alkaline Phosphatase: 75 IU/L (ref 39–117)
BUN/Creatinine Ratio: 19 (ref 12–28)
BUN: 15 mg/dL (ref 8–27)
Bilirubin Total: 0.5 mg/dL (ref 0.0–1.2)
CO2: 25 mmol/L (ref 20–29)
Calcium: 10.7 mg/dL — ABNORMAL HIGH (ref 8.7–10.3)
Chloride: 96 mmol/L (ref 96–106)
Creatinine, Ser: 0.81 mg/dL (ref 0.57–1.00)
GFR calc Af Amer: 88 mL/min/{1.73_m2} (ref >59)
GFR calc non Af Amer: 76 mL/min/{1.73_m2} (ref >59)
Globulin, Total: 2.5 g/dL (ref 1.5–4.5)
Glucose: 139 mg/dL — ABNORMAL HIGH (ref 65–99)
Potassium: 3.6 mmol/L (ref 3.5–5.2)
Sodium: 141 mmol/L (ref 134–144)
Total Protein: 7.4 g/dL (ref 6.0–8.5)

## 2018-12-12 LAB — CBC WITH DIFFERENTIAL/PLATELET
Basophils Absolute: 0.1 10*3/uL (ref 0.0–0.2)
Basos: 1 %
EOS (ABSOLUTE): 0.1 10*3/uL (ref 0.0–0.4)
Eos: 1 %
Hematocrit: 46.6 % (ref 34.0–46.6)
Hemoglobin: 15.4 g/dL (ref 11.1–15.9)
Immature Grans (Abs): 0 10*3/uL (ref 0.0–0.1)
Immature Granulocytes: 0 %
Lymphocytes Absolute: 1.8 10*3/uL (ref 0.7–3.1)
Lymphs: 25 %
MCH: 28.5 pg (ref 26.6–33.0)
MCHC: 33 g/dL (ref 31.5–35.7)
MCV: 86 fL (ref 79–97)
Monocytes Absolute: 0.3 10*3/uL (ref 0.1–0.9)
Monocytes: 5 %
Neutrophils Absolute: 4.7 10*3/uL (ref 1.4–7.0)
Neutrophils: 68 %
Platelets: 339 10*3/uL (ref 150–450)
RBC: 5.4 x10E6/uL — ABNORMAL HIGH (ref 3.77–5.28)
RDW: 12.7 % (ref 11.7–15.4)
WBC: 6.9 10*3/uL (ref 3.4–10.8)

## 2018-12-12 LAB — LIPID PANEL
Chol/HDL Ratio: 2.7 ratio (ref 0.0–4.4)
Cholesterol, Total: 146 mg/dL (ref 100–199)
HDL: 55 mg/dL (ref >39)
LDL Calculated: 67 mg/dL (ref 0–99)
Triglycerides: 119 mg/dL (ref 0–149)
VLDL Cholesterol Cal: 24 mg/dL (ref 5–40)

## 2018-12-12 LAB — MICROALBUMIN / CREATININE URINE RATIO
Creatinine, Urine: 85.6 mg/dL
Microalb/Creat Ratio: 28 mg/g creat (ref 0–29)
Microalbumin, Urine: 24.1 ug/mL

## 2018-12-12 LAB — THYROID PANEL WITH TSH
Free Thyroxine Index: 2.6 (ref 1.2–4.9)
T3 Uptake Ratio: 24 % (ref 24–39)
T4, Total: 10.7 ug/dL (ref 4.5–12.0)
TSH: 1.42 u[IU]/mL (ref 0.450–4.500)

## 2018-12-27 ENCOUNTER — Encounter: Payer: Self-pay | Admitting: *Deleted

## 2018-12-30 ENCOUNTER — Telehealth: Payer: Medicare HMO

## 2019-01-01 ENCOUNTER — Telehealth: Payer: Self-pay | Admitting: Family Medicine

## 2019-01-01 NOTE — Telephone Encounter (Signed)
Daughter aware.

## 2019-01-01 NOTE — Telephone Encounter (Signed)
That will be fine to cut in half until can follow up with [email or web address], FNP. Make sure that keep check of blood pressures and that she remains on all other meds

## 2019-01-01 NOTE — Telephone Encounter (Signed)
According to [place] office note- they increased her clonidine to .3bid and she was to continue metoprolol. Does she have any 0.2mg  of clonidine to take until Rakes gets back form vacation?

## 2019-01-01 NOTE — Telephone Encounter (Signed)
BP readings  170/100- not taking any medication in 3 days.    Previously waking up dizzy since medication change to clonidine.   Daughter wants her to start back metoproplol

## 2019-01-01 NOTE — Telephone Encounter (Signed)
Daughter states that patient does not have 0.2mg  of clonidine but can cut the 0.3mg  in half and give her that

## 2019-01-06 ENCOUNTER — Telehealth: Payer: Medicare HMO

## 2019-01-10 ENCOUNTER — Ambulatory Visit: Payer: Medicare HMO | Admitting: *Deleted

## 2019-01-10 DIAGNOSIS — I701 Atherosclerosis of renal artery: Secondary | ICD-10-CM

## 2019-01-10 DIAGNOSIS — E1169 Type 2 diabetes mellitus with other specified complication: Secondary | ICD-10-CM

## 2019-01-10 DIAGNOSIS — E1159 Type 2 diabetes mellitus with other circulatory complications: Secondary | ICD-10-CM

## 2019-01-10 DIAGNOSIS — I69354 Hemiplegia and hemiparesis following cerebral infarction affecting left non-dominant side: Secondary | ICD-10-CM

## 2019-01-10 NOTE — Chronic Care Management (AMB) (Signed)
  Chronic Care Management   Initial Outreach Note  01/10/2019 Name: Amanda Booker MRN: [id number] DOB: 1950/05/07  Referred by: Baruch Gouty, FNP Reason for referral : Chronic Care Management (RN CCM Initial Visit)   An unsuccessful telephone outreach was attempted today. The patient was referred to the case management team by Darla Lesches, FNP for assistance with chronic care management and care coordination. Amanda Booker is a former patient of Dr Edrick Oh and recently transferred her care to [facility] after hs retirement. She has multiple chronic medical conditions and would likely benefit from care management and care coordination. Her chronic medical conditions include: hypertension (uncontrolled), hx of cerebral aneurysm rupture in 1996, hemiparesis affecting left side, renal artery stenosis, diabetes, COPD, hyperlipidemia, CKD, and nicotine dependence.   Follow Up Plan: A HIPPA compliant phone message was left for the patient providing contact information and requesting a return call.  The care management team will reach out to the patient again over the next 10 days.    Chong Sicilian, BSN, RN-BC Embedded Chronic Care Manager [facility] / [place] [facility] Direct Dial: [phone]

## 2019-01-14 ENCOUNTER — Ambulatory Visit: Payer: Medicare HMO | Admitting: *Deleted

## 2019-01-22 NOTE — Chronic Care Management (AMB) (Signed)
  Chronic Care Management   Outreach Note  01/14/2019 Name: Amanda Booker MRN: [id number] DOB: Oct 03, 1950  Referred by: Baruch Gouty, FNP Reason for referral : Chronic Care Management (Outreach )   A second unsuccessful telephone outreach was attempted today. The patient was referred to the case management team for assistance with care management and care coordination.   Follow Up Plan: The care management team will reach out to the patient again over the next 30 days.   Chong Sicilian, BSN, RN-BC Embedded Chronic Care Manager [facility] / [place] [facility] Direct Dial: [phone]

## 2019-03-10 ENCOUNTER — Other Ambulatory Visit: Payer: Self-pay | Admitting: Family Medicine

## 2019-03-10 DIAGNOSIS — I152 Hypertension secondary to endocrine disorders: Secondary | ICD-10-CM

## 2019-03-10 DIAGNOSIS — E1169 Type 2 diabetes mellitus with other specified complication: Secondary | ICD-10-CM

## 2019-03-10 DIAGNOSIS — E1159 Type 2 diabetes mellitus with other circulatory complications: Secondary | ICD-10-CM

## 2019-03-14 ENCOUNTER — Ambulatory Visit: Payer: Medicare HMO | Admitting: Family Medicine

## 2019-03-18 ENCOUNTER — Other Ambulatory Visit: Payer: Self-pay | Admitting: *Deleted

## 2019-03-18 MED ORDER — ATORVASTATIN CALCIUM 40 MG PO TABS: 40.0000 mg | ORAL_TABLET | Freq: Every morning | ORAL | 1 refills | Status: DC

## 2019-03-24 ENCOUNTER — Encounter: Payer: Self-pay | Admitting: Family Medicine

## 2019-03-24 ENCOUNTER — Ambulatory Visit: Payer: Medicare HMO | Admitting: Family Medicine

## 2019-03-24 ENCOUNTER — Other Ambulatory Visit: Payer: Self-pay | Admitting: *Deleted

## 2019-03-24 MED ORDER — GLUCOSE BLOOD VI STRP: ORAL_STRIP | 3 refills | Status: DC

## 2019-03-26 DIAGNOSIS — R69 Illness, unspecified: Secondary | ICD-10-CM | POA: Diagnosis not present

## 2019-04-02 ENCOUNTER — Encounter: Payer: Self-pay | Admitting: Family Medicine

## 2019-04-02 ENCOUNTER — Ambulatory Visit (INDEPENDENT_AMBULATORY_CARE_PROVIDER_SITE_OTHER): Payer: Medicare HMO | Admitting: Family Medicine

## 2019-04-02 ENCOUNTER — Other Ambulatory Visit: Payer: Self-pay

## 2019-04-02 VITALS — BP 172/86 | HR 73 | Temp 97.1°F | Resp 20 | Ht 67.0 in | Wt 170.0 lb

## 2019-04-02 DIAGNOSIS — E1169 Type 2 diabetes mellitus with other specified complication: Secondary | ICD-10-CM | POA: Diagnosis not present

## 2019-04-02 DIAGNOSIS — E1159 Type 2 diabetes mellitus with other circulatory complications: Secondary | ICD-10-CM | POA: Diagnosis not present

## 2019-04-02 DIAGNOSIS — I10 Essential (primary) hypertension: Secondary | ICD-10-CM | POA: Diagnosis not present

## 2019-04-02 DIAGNOSIS — I69354 Hemiplegia and hemiparesis following cerebral infarction affecting left non-dominant side: Secondary | ICD-10-CM | POA: Diagnosis not present

## 2019-04-02 DIAGNOSIS — I152 Hypertension secondary to endocrine disorders: Secondary | ICD-10-CM

## 2019-04-02 DIAGNOSIS — E785 Hyperlipidemia, unspecified: Secondary | ICD-10-CM | POA: Diagnosis not present

## 2019-04-02 DIAGNOSIS — Z23 Encounter for immunization: Secondary | ICD-10-CM

## 2019-04-02 LAB — BAYER DCA HB A1C WAIVED: HB A1C (BAYER DCA - WAIVED): 8.4 % — ABNORMAL HIGH (ref <7.0)

## 2019-04-02 MED ORDER — METFORMIN HCL ER 500 MG PO TB24: 500.0000 mg | ORAL_TABLET | Freq: Two times a day (BID) | ORAL | 1 refills | Status: DC

## 2019-04-02 MED ORDER — AMLODIPINE BESYLATE 10 MG PO TABS: 10.0000 mg | ORAL_TABLET | Freq: Every morning | ORAL | 1 refills | Status: DC

## 2019-04-02 MED ORDER — HYDROCHLOROTHIAZIDE 25 MG PO TABS: 25.0000 mg | ORAL_TABLET | Freq: Every morning | ORAL | 1 refills | Status: DC

## 2019-04-02 MED ORDER — ATORVASTATIN CALCIUM 40 MG PO TABS: 40.0000 mg | ORAL_TABLET | Freq: Every morning | ORAL | 1 refills | Status: DC

## 2019-04-02 MED ORDER — CLONIDINE HCL 0.3 MG PO TABS: 0.3000 mg | ORAL_TABLET | Freq: Two times a day (BID) | ORAL | 1 refills | Status: DC

## 2019-04-02 MED ORDER — METOPROLOL SUCCINATE ER 50 MG PO TB24: 100.0000 mg | ORAL_TABLET | Freq: Every day | ORAL | 1 refills | Status: DC

## 2019-04-02 MED ORDER — GLIPIZIDE ER 5 MG PO TB24: 10.0000 mg | ORAL_TABLET | Freq: Two times a day (BID) | ORAL | 1 refills | Status: DC

## 2019-04-02 NOTE — Progress Notes (Signed)
Subjective:  Patient ID: Amanda Booker, female    DOB: 1950/08/17, 68 y.o.   MRN: [id number]  Patient Care Team: Baruch Gouty, FNP as PCP - General (Family Medicine)   Chief Complaint:  Medical Management of Chronic Issues   HPI: Amanda Booker is a 68 y.o. female presenting on 04/02/2019 for Medical Management of Chronic Issues  1. DM type 2 with diabetic dyslipidemia ([place]) Pt has not been taking metformin twice daily as prescribed. Pt has been taking it once daily. She is taking the glipizide twice daily as prescribed. She does not watch her diet as closely as she should. She does not exercise on a regular basis.   2. Hypertension associated with diabetes ([place]) She is only taking 1/2 tablet of catapres twice daily. States when she was taking 1 tablet twice daily she had morning dizziness. She is taking other medications as prescribed. She denies chest pain, headache, confusion, or leg swelling.   3. Hyperlipidemia associated with type 2 diabetes mellitus ([place]) Compliant with statin without associated side effects. Does not follow a strict diet or exercise plan.   4. Hemiparesis affecting left side as late effect of cerebrovascular accident (CVA) ([place]) Feels she has improved some. She is getting around well. Followed closely by [facility] neurosurgery. No new symptoms.      Relevant past medical, surgical, family, and social history reviewed and updated as indicated.  Allergies and medications reviewed and updated. Date reviewed: Chart in Epic.   Past Medical History:  Diagnosis Date  . Cerebral aneurysm rupture ([place])   . Cerebral ventricular shunt fitting or adjustment   . Chronic kidney disease   . Coagulopathy ([place])   . Diabetes mellitus   . Hypertension   . Hypokalemia   . Kidney stone   . Renal artery stenosis ([place])   . Renal artery stenosis [facility])     Past Surgical History:  Procedure Laterality Date  . ABDOMINAL HYSTERECTOMY  2007  . cerebral shunt       Social History   Socioeconomic History  . Marital status: Widowed    Spouse name: Not on file  . Number of children: 3  . Years of education: Not on file  . Highest education level: Not on file  Occupational History  . Not on file  Social Needs  . Financial resource strain: Not on file  . Food insecurity    Worry: Not on file    Inability: Not on file  . Transportation needs    Medical: Not on file    Non-medical: Not on file  Tobacco Use  . Smoking status: Current Every Day Smoker    Packs/day: 0.75    Years: 60.00    Pack years: 45.00    Types: Cigarettes  . Smokeless tobacco: Never Used  Substance and Sexual Activity  . Alcohol use: No  . Drug use: No  . Sexual activity: Not Currently  Lifestyle  . Physical activity    Days per week: Not on file    Minutes per session: Not on file  . Stress: Not on file  Relationships  . Social Herbalist on phone: Not on file    Gets together: Not on file    Attends religious service: Not on file    Active member of club or organization: Not on file    Attends meetings of clubs or organizations: Not on file    Relationship status: Not on file  .  Intimate partner violence    Fear of current or ex partner: Not on file    Emotionally abused: Not on file    Physically abused: Not on file    Forced sexual activity: Not on file  Other Topics Concern  . Not on file  Social History Narrative  . Not on file    Outpatient Encounter Medications as of 04/02/2019  Medication Sig  . albuterol (PROVENTIL) (5 MG/ML) 0.5% nebulizer solution Take 0.5 mLs (2.5 mg total) by nebulization every 6 (six) hours as needed for wheezing or shortness of breath.  Marland Kitchen aspirin 81 MG tablet Take 81 mg by mouth every morning.   Marland Kitchen atorvastatin (LIPITOR) 40 MG tablet Take 1 tablet (40 mg total) by mouth every morning.  . fluticasone-salmeterol (ADVAIR HFA) 45-21 MCG/ACT inhaler Inhale 2 puffs into the lungs 2 (two) times daily.  Marland Kitchen glipiZIDE  (GLUCOTROL XL) 5 MG 24 hr tablet Take 2 tablets (10 mg total) by mouth 2 (two) times daily.  Marland Kitchen glucose blood test strip Check BS daily Dx E11.69  . metFORMIN (GLUCOPHAGE-XR) 500 MG 24 hr tablet Take 1 tablet (500 mg total) by mouth 2 (two) times daily.  . metoprolol succinate (TOPROL-XL) 50 MG 24 hr tablet Take 2 tablets (100 mg total) by mouth daily. Take with or immediately following a meal.  . [DISCONTINUED] atorvastatin (LIPITOR) 40 MG tablet Take 1 tablet (40 mg total) by mouth every morning.  . [DISCONTINUED] glipiZIDE (GLUCOTROL XL) 5 MG 24 hr tablet Take 2 tablets by mouth 2 (two) times daily.  . [DISCONTINUED] metFORMIN (GLUCOPHAGE-XR) 500 MG 24 hr tablet Take 1 tablet by mouth twice daily  . [DISCONTINUED] metoprolol succinate (TOPROL-XL) 50 MG 24 hr tablet Take 2 tablets by mouth once daily  . amLODipine (NORVASC) 10 MG tablet Take 1 tablet (10 mg total) by mouth every morning.  . cloNIDine (CATAPRES) 0.3 MG tablet Take 1 tablet (0.3 mg total) by mouth 2 (two) times daily.  . hydrochlorothiazide (HYDRODIURIL) 25 MG tablet Take 1 tablet (25 mg total) by mouth every morning.  . quinapril (ACCUPRIL) 40 MG tablet Take 1 tablet (40 mg total) by mouth every morning.  . [DISCONTINUED] amLODipine (NORVASC) 10 MG tablet Take 1 tablet (10 mg total) by mouth every morning.  . [DISCONTINUED] cloNIDine (CATAPRES) 0.3 MG tablet Take 1 tablet (0.3 mg total) by mouth 2 (two) times daily.  . [DISCONTINUED] hydrochlorothiazide (HYDRODIURIL) 25 MG tablet Take 1 tablet (25 mg total) by mouth every morning.   No facility-administered encounter medications on file as of 04/02/2019.     No Known Allergies  Review of Systems  Constitutional: Negative for activity change, appetite change, chills, diaphoresis, fatigue, fever and unexpected weight change.  HENT: Negative.   Eyes: Negative.  Negative for photophobia and visual disturbance.  Respiratory: Negative for cough, chest tightness and shortness of  breath.   Cardiovascular: Negative for chest pain, palpitations and leg swelling.  Gastrointestinal: Negative for abdominal pain, blood in stool, constipation, diarrhea, nausea and vomiting.  Endocrine: Negative.  Negative for cold intolerance, heat intolerance, polydipsia, polyphagia and polyuria.  Genitourinary: Negative for decreased urine volume, difficulty urinating, dysuria, frequency and urgency.  Musculoskeletal: Positive for gait problem (left sided from previous CVA, does well). Negative for arthralgias and myalgias.  Skin: Negative.   Allergic/Immunologic: Negative.   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 Negative for dizziness, tremors, seizures, syncope, facial asymmetry, speech difficulty, light-headedness, numbness and headaches.  Hematological: Negative.   Psychiatric/Behavioral:  Negative for confusion, hallucinations, sleep disturbance and suicidal ideas.  All other systems reviewed and are negative.       Objective:  BP (!) 172/86   Pulse 73   Temp (!) 97.1 F (36.2 C) (Temporal)   Resp 20   Ht _0  (1.702 m)   Wt 170 lb (77.1 kg)   SpO2 96%   BMI 26.63 kg/m    Wt Readings from Last 3 Encounters:  04/02/19 170 lb (77.1 kg)  12/11/18 175 lb (79.4 kg)  10/03/13 198 lb (89.8 kg)    Physical Exam Vitals signs and nursing note reviewed.  Constitutional:      General: She is not in acute distress.    Appearance: Normal appearance. She is well-developed and well-groomed. She is not ill-appearing, toxic-appearing or diaphoretic.  HENT:     Head: Normocephalic and atraumatic.     Jaw: There is normal jaw occlusion.     Right Ear: Hearing normal.     Left Ear: Hearing normal.     Nose: Nose normal.     Mouth/Throat:     Lips: Pink.     Mouth: Mucous membranes are moist.     Pharynx: Oropharynx is clear. Uvula midline.  Eyes:     General: Lids are normal.     Extraocular Movements: Extraocular movements intact.      Conjunctiva/sclera: Conjunctivae normal.     Pupils: Pupils are equal, round, and reactive to light.  Neck:     Musculoskeletal: Normal range of motion and neck supple.     Thyroid: No thyroid mass, thyromegaly or thyroid tenderness.     Vascular: No carotid bruit or JVD.     Trachea: Trachea and phonation normal.  Cardiovascular:     Rate and Rhythm: Normal rate and regular rhythm.     Chest Wall: PMI is not displaced.     Pulses: Normal pulses.     Heart sounds: Normal heart sounds. No murmur. No friction rub. No gallop.   Pulmonary:     Effort: Pulmonary effort is normal. No respiratory distress.     Breath sounds: Normal breath sounds. No wheezing.  Abdominal:     General: Bowel sounds are normal. There is no distension or abdominal bruit.     Palpations: Abdomen is soft. There is no hepatomegaly or splenomegaly.     Tenderness: There is no abdominal tenderness. There is no right CVA tenderness or left CVA tenderness.     Hernia: No hernia is present.  Musculoskeletal: Normal range of motion.     Right lower leg: No edema.     Left lower leg: No edema.  Lymphadenopathy:     Cervical: No cervical adenopathy.  Skin:    General: Skin is warm and dry.     Capillary Refill: Capillary refill takes less than 2 seconds.     Coloration: Skin is not cyanotic, jaundiced or pale.     Findings: No rash.  Neurological:     General: No focal deficit present.     Mental Status: She is alert and oriented to person, place, and time.     Cranial Nerves: No cranial nerve deficit.     Sensory: Sensation is intact. No sensory deficit.     Motor: Weakness (left sided from previous CVA) present.     Coordination: Coordination is intact. Coordination normal.     Gait: Gait abnormal (from previous CVA).     Deep Tendon Reflexes: Reflexes are normal and symmetric. Reflexes  normal.  Psychiatric:        Attention and Perception: Attention and perception normal.        Mood and Affect: Mood and affect  normal.        Speech: Speech normal.        Behavior: Behavior normal. Behavior is cooperative.        Thought Content: Thought content normal.        Cognition and Memory: Cognition and memory normal.        Judgment: Judgment normal.     Results for orders placed or performed in visit on 04/02/19  hgba1c  Result Value Ref Range   HB A1C (BAYER DCA - WAIVED) 8.4 (H) <7.0 %       Pertinent labs & imaging results that were available during my care of the patient were reviewed by me and considered in my medical decision making.  Assessment & Plan:  Rashaun was seen today for medical management of chronic issues.  Diagnoses and all orders for this visit:  DM type 2 with diabetic dyslipidemia ([place]) A1C 8.4 today. Pt reluctant to change therapy. Pt and daughter want to adjust diet and reevaluate in 3 months. If A1C remains elevated, will start Ozempic. Pt agrees to this plan of action. Eye exam by Dr. Nicoletta Dress, will request records.  -     hgba1c -     glipiZIDE (GLUCOTROL XL) 5 MG 24 hr tablet; Take 2 tablets (10 mg total) by mouth 2 (two) times daily. -     metFORMIN (GLUCOPHAGE-XR) 500 MG 24 hr tablet; Take 1 tablet (500 mg total) by mouth 2 (two) times daily.  Hypertension associated with diabetes ([place]) BP poorly controlled. Changes were not made in regimen today, pt and daughter decline to make changes. Goal BP is 130/80. Pt aware to report any persistent high or low readings. DASH diet and exercise encouraged. Exercise at least 150 minutes per week and increase as tolerated. Goal BMI > 25. Stress management encouraged. Avoid nicotine and tobacco product use. Avoid excessive alcohol and NSAID's. Avoid more than 2000 mg of sodium daily. Medications as prescribed. Follow up as scheduled.  -     CMP14+EGFR -     cloNIDine (CATAPRES) 0.3 MG tablet; Take 1 tablet (0.3 mg total) by mouth 2 (two) times daily. -     amLODipine (NORVASC) 10 MG tablet; Take 1 tablet (10 mg total) by mouth every  morning. -     hydrochlorothiazide (HYDRODIURIL) 25 MG tablet; Take 1 tablet (25 mg total) by mouth every morning. -     metoprolol succinate (TOPROL-XL) 50 MG 24 hr tablet; Take 2 tablets (100 mg total) by mouth daily. Take with or immediately following a meal.  Hyperlipidemia associated with type 2 diabetes mellitus ([place]) Diet encouraged - increase intake of fresh fruits and vegetables, increase intake of lean proteins. Bake, broil, or grill foods. Avoid fried, greasy, and fatty foods. Avoid fast foods. Increase intake of fiber-rich whole grains. Exercise encouraged - at least 150 minutes per week and advance as tolerated.  Goal BMI < 25. Continue medications as prescribed. Follow up in 3-6 months as discussed.  -     Lipid panel -     atorvastatin (LIPITOR) 40 MG tablet; Take 1 tablet (40 mg total) by mouth every morning.  Hemiparesis affecting left side as late effect of cerebrovascular accident (CVA) [facility]) Doing well, continue to see neurosurgery at [facility].   Need for immunization against influenza -  Flu Vaccine QUAD High Dose(Fluad)    Total time spent with patient 45 minutes.  Greater than 50% of encounter spent in coordination of care/counseling.  Continue all other maintenance medications.  Follow up plan: Return in about 3 months (around 07/01/2019), or if symptoms worsen or fail to improve, for DM.  Continue healthy lifestyle choices, including diet (rich in fruits, vegetables, and lean proteins, and low in salt and simple carbohydrates) and exercise (at least 30 minutes of moderate physical activity daily).  Educational handout given for DM  The above assessment and management plan was discussed with the patient. The patient verbalized understanding of and has agreed to the management plan. Patient is aware to call the clinic if they develop any new symptoms or if symptoms persist or worsen. Patient is aware when to return to the clinic for a follow-up visit. Patient educated  on when it is appropriate to go to the emergency department.   Monia Pouch, FNP-C [facility] [phone]

## 2019-04-02 NOTE — Patient Instructions (Signed)

## 2019-04-03 LAB — CMP14+EGFR
ALT: 13 IU/L (ref 0–32)
AST: 9 IU/L (ref 0–40)
Albumin/Globulin Ratio: 2.1 (ref 1.2–2.2)
Albumin: 4.6 g/dL (ref 3.8–4.8)
Alkaline Phosphatase: 76 IU/L (ref 39–117)
BUN/Creatinine Ratio: 20 (ref 12–28)
BUN: 16 mg/dL (ref 8–27)
Bilirubin Total: 0.4 mg/dL (ref 0.0–1.2)
CO2: 27 mmol/L (ref 20–29)
Calcium: 10.5 mg/dL — ABNORMAL HIGH (ref 8.7–10.3)
Chloride: 98 mmol/L (ref 96–106)
Creatinine, Ser: 0.79 mg/dL (ref 0.57–1.00)
GFR calc Af Amer: 90 mL/min/{1.73_m2} (ref >59)
GFR calc non Af Amer: 78 mL/min/{1.73_m2} (ref >59)
Globulin, Total: 2.2 g/dL (ref 1.5–4.5)
Glucose: 187 mg/dL — ABNORMAL HIGH (ref 65–99)
Potassium: 3.5 mmol/L (ref 3.5–5.2)
Sodium: 141 mmol/L (ref 134–144)
Total Protein: 6.8 g/dL (ref 6.0–8.5)

## 2019-04-03 LAB — LIPID PANEL
Chol/HDL Ratio: 3 ratio (ref 0.0–4.4)
Cholesterol, Total: 173 mg/dL (ref 100–199)
HDL: 57 mg/dL (ref >39)
LDL Chol Calc (NIH): 96 mg/dL (ref 0–99)
Triglycerides: 110 mg/dL (ref 0–149)
VLDL Cholesterol Cal: 20 mg/dL (ref 5–40)

## 2019-04-07 ENCOUNTER — Encounter: Payer: Self-pay | Admitting: *Deleted

## 2019-05-11 ENCOUNTER — Other Ambulatory Visit: Payer: Self-pay | Admitting: Family Medicine

## 2019-05-11 DIAGNOSIS — E1159 Type 2 diabetes mellitus with other circulatory complications: Secondary | ICD-10-CM

## 2019-06-22 DIAGNOSIS — R69 Illness, unspecified: Secondary | ICD-10-CM | POA: Diagnosis not present

## 2019-07-01 ENCOUNTER — Telehealth: Payer: Self-pay | Admitting: *Deleted

## 2019-07-01 NOTE — Telephone Encounter (Signed)
Incoming fax from [place] [facility]   Glipizide ER tab 5 mg   This drug is on a qty limit. They will not cover more that 90 pills per 30 days. Which is # 270 for 3 mo supply  Pt currently gets # 360 for 3 mo supply  Can med or dose be changed to accommodate the plan?

## 2019-07-02 ENCOUNTER — Ambulatory Visit (INDEPENDENT_AMBULATORY_CARE_PROVIDER_SITE_OTHER): Payer: Medicare HMO | Admitting: Family Medicine

## 2019-07-02 ENCOUNTER — Other Ambulatory Visit: Payer: Self-pay | Admitting: Family Medicine

## 2019-07-02 ENCOUNTER — Encounter: Payer: Self-pay | Admitting: Family Medicine

## 2019-07-02 ENCOUNTER — Other Ambulatory Visit: Payer: Self-pay

## 2019-07-02 VITALS — BP 136/76 | HR 76 | Temp 97.8°F | Resp 20 | Ht 67.0 in | Wt 173.0 lb

## 2019-07-02 DIAGNOSIS — J449 Chronic obstructive pulmonary disease, unspecified: Secondary | ICD-10-CM

## 2019-07-02 DIAGNOSIS — E1159 Type 2 diabetes mellitus with other circulatory complications: Secondary | ICD-10-CM | POA: Diagnosis not present

## 2019-07-02 DIAGNOSIS — E1169 Type 2 diabetes mellitus with other specified complication: Secondary | ICD-10-CM

## 2019-07-02 DIAGNOSIS — I10 Essential (primary) hypertension: Secondary | ICD-10-CM

## 2019-07-02 DIAGNOSIS — F5101 Primary insomnia: Secondary | ICD-10-CM

## 2019-07-02 DIAGNOSIS — E785 Hyperlipidemia, unspecified: Secondary | ICD-10-CM

## 2019-07-02 DIAGNOSIS — R69 Illness, unspecified: Secondary | ICD-10-CM | POA: Diagnosis not present

## 2019-07-02 LAB — BAYER DCA HB A1C WAIVED: HB A1C (BAYER DCA - WAIVED): 9.3 % — ABNORMAL HIGH (ref <7.0)

## 2019-07-02 MED ORDER — TRAZODONE HCL 50 MG PO TABS: 25.0000 mg | ORAL_TABLET | Freq: Every evening | ORAL | 3 refills | Status: DC | PRN

## 2019-07-02 MED ORDER — METFORMIN HCL 1000 MG PO TABS: 1000.0000 mg | ORAL_TABLET | Freq: Two times a day (BID) | ORAL | 3 refills | Status: DC

## 2019-07-02 MED ORDER — ALBUTEROL SULFATE HFA 108 (90 BASE) MCG/ACT IN AERS: 2.0000 | INHALATION_SPRAY | Freq: Four times a day (QID) | RESPIRATORY_TRACT | 11 refills | Status: DC | PRN

## 2019-07-02 MED ORDER — GLIPIZIDE 10 MG PO TABS: 10.0000 mg | ORAL_TABLET | Freq: Two times a day (BID) | ORAL | 1 refills | Status: DC

## 2019-07-02 NOTE — Telephone Encounter (Signed)
She is taking 10 mg twice daily so I switched this to the 10 mg tablet twice daily to see if insurance will cover this.

## 2019-07-02 NOTE — Progress Notes (Signed)
Subjective:  Patient ID: Amanda Booker, female    DOB: April 27, 1950, 69 y.o.   MRN: [id number]  Patient Care Team: Sharion Balloon, FNP as PCP - General (Family Medicine)   Chief Complaint:  Medical Management of Chronic Issues, Diabetes, Hypertension, and Hyperlipidemia   HPI: Amanda Booker is a 69 y.o. female presenting on 07/02/2019 for Medical Management of Chronic Issues, Diabetes, Hypertension, and Hyperlipidemia   1. DM type 2 with diabetic dyslipidemia ([place]) Pt reports home blood sugars ranging from 150-300. States she has been getting up at night because she can not sleep and snacks several times during the night. States she does take her medications as prescribed but is eating more than usual. No polyuria or polydipsia, just polyphagia. She states she is unable to sleep through the night and when she gets up she will go to the kitchen and get a snack and then try to go back to sleep. Snacks consist of cornbeef hash, sandwiches, and mainly simple carbs or quick snacks.   2. Hypertension associated with diabetes ([place]) Pt denies elevated blood pressure readings at home. She reports good compliance with medications. Not adherent to diet and exercise. She denies headache, chest pain, shortness of breath, leg swelling, dizziness, or new weakness.   3. Hyperlipidemia associated with type 2 diabetes mellitus ([place]) Not adherent to diet and exercise. Does take statin as prescribed and is tolerating well. No associated side effects.   4. COPD (chronic obstructive pulmonary disease) with chronic bronchitis (HCC) Doing well on current regiment. Is out of as needed SABA inhaler, will prescribe today. Still smoking, but has cut back per pt report.     Relevant past medical, surgical, family, and social history reviewed and updated as indicated.  Allergies and medications reviewed and updated. Date reviewed: Chart in Epic.   Past Medical History:  Diagnosis Date  . 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 Cerebral ventricular shunt fitting or adjustment   . Chronic kidney disease   . Coagulopathy ([place])   . Diabetes mellitus   . Hypertension   . Hypokalemia   . Kidney stone   . Renal artery stenosis ([place])   . Renal artery stenosis [facility])     Past Surgical History:  Procedure Laterality Date  . ABDOMINAL HYSTERECTOMY  2007  . cerebral shunt      Social History   Socioeconomic History  . Marital status: Widowed    Spouse name: Not on file  . Number of children: 3  . Years of education: Not on file  . Highest education level: Not on file  Occupational History  . Not on file  Tobacco Use  . Smoking status: Current Every Day Smoker    Packs/day: 0.75    Years: 60.00    Pack years: 45.00    Types: Cigarettes  . Smokeless tobacco: Never Used  Substance and Sexual Activity  . Alcohol use: No  . Drug use: No  . Sexual activity: Not Currently  Other Topics Concern  . Not on file  Social History Narrative  . Not on file   Social Determinants of Health   Financial Resource Strain:   . Difficulty of Paying Living Expenses: Not on file  Food Insecurity:   . Worried About Charity fundraiser in the Last Year: Not on file  . Ran Out of Food in the Last Year: Not on file  Transportation Needs:   . Lack of Transportation (Medical): Not on  file  . Lack of Transportation (Non-Medical): Not on file  Physical Activity:   . Days of Exercise per Week: Not on file  . Minutes of Exercise per Session: Not on file  Stress:   . Feeling of Stress : Not on file  Social Connections:   . Frequency of Communication with Friends and Family: Not on file  . Frequency of Social Gatherings with Friends and Family: Not on file  . Attends Religious Services: Not on file  . Active Member of Clubs or Organizations: Not on file  . Attends Archivist Meetings: Not on file  . Marital Status: Not on file  Intimate Partner Violence:   . Fear of Current or  Ex-Partner: Not on file  . Emotionally Abused: Not on file  . Physically Abused: Not on file  . Sexually Abused: Not on file    Outpatient Encounter Medications as of 07/02/2019  Medication Sig  . albuterol (PROVENTIL) (5 MG/ML) 0.5% nebulizer solution Take 0.5 mLs (2.5 mg total) by nebulization every 6 (six) hours as needed for wheezing or shortness of breath.  Marland Kitchen amLODipine (NORVASC) 10 MG tablet Take 1 tablet (10 mg total) by mouth every morning.  Marland Kitchen aspirin 81 MG tablet Take 81 mg by mouth every morning.   Marland Kitchen atorvastatin (LIPITOR) 40 MG tablet Take 1 tablet (40 mg total) by mouth every morning.  . cloNIDine (CATAPRES) 0.3 MG tablet Take 1 tablet (0.3 mg total) by mouth 2 (two) times daily.  . fluticasone-salmeterol (ADVAIR HFA) 45-21 MCG/ACT inhaler Inhale 2 puffs into the lungs 2 (two) times daily.  Marland Kitchen glipiZIDE (GLUCOTROL) 10 MG tablet Take 1 tablet (10 mg total) by mouth 2 (two) times daily before a meal.  . glucose blood test strip Check BS daily Dx E11.69  . hydrochlorothiazide (HYDRODIURIL) 25 MG tablet Take 1 tablet (25 mg total) by mouth every morning.  . metoprolol succinate (TOPROL-XL) 50 MG 24 hr tablet Take 2 tablets (100 mg total) by mouth daily. Take with or immediately following a meal.  . quinapril (ACCUPRIL) 40 MG tablet TAKE 1 TABLET BY MOUTH ONCE DAILY IN THE MORNING  . [DISCONTINUED] metFORMIN (GLUCOPHAGE-XR) 500 MG 24 hr tablet Take 1 tablet (500 mg total) by mouth 2 (two) times daily.  Marland Kitchen albuterol (VENTOLIN HFA) 108 (90 Base) MCG/ACT inhaler Inhale 2 puffs into the lungs every 6 (six) hours as needed for wheezing or shortness of breath.  . metFORMIN (GLUCOPHAGE) 1000 MG tablet Take 1 tablet (1,000 mg total) by mouth 2 (two) times daily with a meal.  . traZODone (DESYREL) 50 MG tablet Take 0.5-1 tablets (25-50 mg total) by mouth at bedtime as needed for sleep.  . [DISCONTINUED] glipiZIDE (GLUCOTROL XL) 5 MG 24 hr tablet Take 2 tablets (10 mg total) by mouth 2 (two) times  daily.   No facility-administered encounter medications on file as of 07/02/2019.    No Known Allergies  Review of Systems  Constitutional: Negative for activity change, appetite change, chills, diaphoresis, fatigue, fever and unexpected weight change.  HENT: Negative.   Eyes: Negative.  Negative for photophobia and visual disturbance.  Respiratory: Negative for cough, chest tightness and shortness of breath.   Cardiovascular: Negative for chest pain, palpitations and leg swelling.  Gastrointestinal: Negative for abdominal pain, blood in stool, constipation, diarrhea, nausea and vomiting.  Endocrine: Positive for polyphagia. Negative for cold intolerance, heat intolerance, polydipsia and polyuria.  Genitourinary: Negative for decreased urine volume, difficulty urinating, dysuria, frequency and urgency.  Musculoskeletal:  Positive for gait problem (uses cane). Negative for arthralgias and myalgias.  Skin: Negative.   Allergic/Immunologic: Negative.   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 Negative for dizziness, tremors, seizures, syncope, facial asymmetry, speech difficulty, light-headedness, numbness and headaches.  Hematological: Negative.   Psychiatric/Behavioral: Positive for sleep disturbance. Negative for confusion, hallucinations and suicidal ideas.  All other systems reviewed and are negative.       Objective:  BP 136/76 (BP Location: Right Arm, Cuff Size: Normal)   Pulse 76   Temp 97.8 F (36.6 C)   Resp 20   Ht 5' 7"  (1.702 m)   Wt 173 lb (78.5 kg)   SpO2 96%   BMI 27.10 kg/m    Wt Readings from Last 3 Encounters:  07/02/19 173 lb (78.5 kg)  04/02/19 170 lb (77.1 kg)  12/11/18 175 lb (79.4 kg)    Physical Exam Vitals and nursing note reviewed.  Constitutional:      General: She is not in acute distress.    Appearance: Normal appearance. She is well-developed, well-groomed and overweight. She is not ill-appearing, toxic-appearing or  diaphoretic.  HENT:     Head: Normocephalic and atraumatic.     Jaw: There is normal jaw occlusion.     Right Ear: Hearing normal.     Left Ear: Hearing normal.     Nose: Nose normal.     Mouth/Throat:     Lips: Pink.     Mouth: Mucous membranes are moist.     Pharynx: Oropharynx is clear. Uvula midline.  Eyes:     General: Lids are normal.     Extraocular Movements: Extraocular movements intact.     Conjunctiva/sclera: Conjunctivae normal.     Pupils: Pupils are equal, round, and reactive to light.  Neck:     Thyroid: No thyroid mass, thyromegaly or thyroid tenderness.     Vascular: No carotid bruit or JVD.     Trachea: Trachea and phonation normal.  Cardiovascular:     Rate and Rhythm: Normal rate and regular rhythm.     Chest Wall: PMI is not displaced.     Pulses: Normal pulses.     Heart sounds: Normal heart sounds. No murmur. No friction rub. No gallop.   Pulmonary:     Effort: Pulmonary effort is normal. No respiratory distress.     Breath sounds: Normal breath sounds. No wheezing.  Abdominal:     General: Bowel sounds are normal. There is no distension or abdominal bruit.     Palpations: Abdomen is soft. There is no hepatomegaly or splenomegaly.     Tenderness: There is no abdominal tenderness. There is no right CVA tenderness or left CVA tenderness.     Hernia: No hernia is present.  Musculoskeletal:        General: Normal range of motion.     Cervical back: Normal range of motion and neck supple.     Right lower leg: No edema.     Left lower leg: No edema.  Lymphadenopathy:     Cervical: No cervical adenopathy.  Skin:    General: Skin is warm and dry.     Capillary Refill: Capillary refill takes less than 2 seconds.     Coloration: Skin is not cyanotic, jaundiced or pale.     Findings: No rash.  Neurological:     General: No focal deficit present.     Mental Status: She is alert and oriented to person, place, and time.  Cranial Nerves: Cranial nerves are  intact.     Sensory: Sensation is intact.     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     Coordination: Coordination is intact.     Gait: Gait abnormal (uses cane).     Deep Tendon Reflexes: Reflexes are normal and symmetric.  Psychiatric:        Attention and Perception: Attention and perception normal.        Mood and Affect: Mood and affect normal.        Speech: Speech normal.        Behavior: Behavior normal. Behavior is cooperative.        Thought Content: Thought content normal.        Cognition and Memory: Cognition and memory normal.        Judgment: Judgment normal.     Results for orders placed or performed in visit on 04/02/19  hgba1c  Result Value Ref Range   HB A1C (BAYER DCA - WAIVED) 8.4 (H) <7.0 %  CMP14+EGFR  Result Value Ref Range   Glucose 187 (H) 65 - 99 mg/dL   BUN 16 8 - 27 mg/dL   Creatinine, Ser 0.79 0.57 - 1.00 mg/dL   GFR calc non Af Amer 78 >59 mL/min/1.73   GFR calc Af Amer 90 >59 mL/min/1.73   BUN/Creatinine Ratio 20 12 - 28   Sodium 141 134 - 144 mmol/L   Potassium 3.5 3.5 - 5.2 mmol/L   Chloride 98 96 - 106 mmol/L   CO2 27 20 - 29 mmol/L   Calcium 10.5 (H) 8.7 - 10.3 mg/dL   Total Protein 6.8 6.0 - 8.5 g/dL   Albumin 4.6 3.8 - 4.8 g/dL   Globulin, Total 2.2 1.5 - 4.5 g/dL   Albumin/Globulin Ratio 2.1 1.2 - 2.2   Bilirubin Total 0.4 0.0 - 1.2 mg/dL   Alkaline Phosphatase 76 39 - 117 IU/L   AST 9 0 - 40 IU/L   ALT 13 0 - 32 IU/L  Lipid panel  Result Value Ref Range   Cholesterol, Total 173 100 - 199 mg/dL   Triglycerides 110 0 - 149 mg/dL   HDL 57 >39 mg/dL   VLDL Cholesterol Cal 20 5 - 40 mg/dL   LDL Chol Calc (NIH) 96 0 - 99 mg/dL   Chol/HDL Ratio 3.0 0.0 - 4.4 ratio       Pertinent labs & imaging results that were available during my care of the patient were reviewed by me and considered in my medical decision making.  Assessment & Plan:  Amanda Booker was seen today for medical management of chronic  issues, diabetes, hypertension and hyperlipidemia.  Diagnoses and all orders for this visit:  DM type 2 with diabetic dyslipidemia ([place]) A1C 9.3 today. Will increase metformin to 1000 mg twice daily. Diet and exercise discussed in detail. Pt aware new medications will have to be added if A1C remains this high at next visit. Discussed healthy snacks in detail with pt and daughter.  -     CBC with Differential/Platelet -     Bayer DCA Hb A1c Waived -     metFORMIN (GLUCOPHAGE) 1000 MG tablet; Take 1 tablet (1,000 mg total) by mouth 2 (two) times daily with a meal.  Hypertension associated with diabetes ([place]) BP fairly controlled. Changes were not made in regimen today. Goal BP is 130/80. Pt aware to report any persistent high or low readings. DASH diet and exercise encouraged. Exercise  at least 150 minutes per week and increase as tolerated. Goal BMI > 25. Stress management encouraged. Avoid nicotine and tobacco product use. Avoid excessive alcohol and NSAID's. Avoid more than 2000 mg of sodium daily. Medications as prescribed. Follow up as scheduled.  -     CBC with Differential/Platelet -     CMP14+EGFR  Hyperlipidemia associated with type 2 diabetes mellitus ([place]) Diet encouraged - increase intake of fresh fruits and vegetables, increase intake of lean proteins. Bake, broil, or grill foods. Avoid fried, greasy, and fatty foods. Avoid fast foods. Increase intake of fiber-rich whole grains. Exercise encouraged - at least 150 minutes per week and advance as tolerated.  Goal BMI < 25. Continue medications as prescribed. Follow up in 3-6 months as discussed.  -     CBC with Differential/Platelet -     Lipid panel  COPD (chronic obstructive pulmonary disease) with chronic bronchitis (HCC) Doing well on current regimen. Will prescribe PRN SABA today. Continue other medications as prescribed.  -     albuterol (VENTOLIN HFA) 108 (90 Base) MCG/ACT inhaler; Inhale 2 puffs into the lungs every 6 (six)  hours as needed for wheezing or shortness of breath.  Primary insomnia Ongoing and worsening since loss of husband. Will trial below. Pt aware to start out with low dose and titrate up as needed. Sedation precautions provided. Sleep hygiene discussed in detail.  -     traZODone (DESYREL) 50 MG tablet; Take 0.5-1 tablets (25-50 mg total) by mouth at bedtime as needed for sleep.     Continue all other maintenance medications.  Follow up plan: Return in about 3 months (around 10/02/2019), or if symptoms worsen or fail to improve, for DM.  Continue healthy lifestyle choices, including diet (rich in fruits, vegetables, and lean proteins, and low in salt and simple carbohydrates) and exercise (at least 30 minutes of moderate physical activity daily).  Educational handout given for DM  The above assessment and management plan was discussed with the patient. The patient verbalized understanding of and has agreed to the management plan. Patient is aware to call the clinic if they develop any new symptoms or if symptoms persist or worsen. Patient is aware when to return to the clinic for a follow-up visit. Patient educated on when it is appropriate to go to the emergency department.   Monia Pouch, FNP-C [facility] [phone]

## 2019-07-02 NOTE — Telephone Encounter (Signed)
Rx changed - 5 mg PA will be closed

## 2019-07-02 NOTE — Patient Instructions (Signed)

## 2019-07-03 ENCOUNTER — Ambulatory Visit (INDEPENDENT_AMBULATORY_CARE_PROVIDER_SITE_OTHER): Payer: Medicare HMO | Admitting: *Deleted

## 2019-07-03 DIAGNOSIS — Z0000 Encounter for general adult medical examination without abnormal findings: Secondary | ICD-10-CM

## 2019-07-03 LAB — CMP14+EGFR
ALT: 11 IU/L (ref 0–32)
AST: 9 IU/L (ref 0–40)
Albumin/Globulin Ratio: 1.9 (ref 1.2–2.2)
Albumin: 4.4 g/dL (ref 3.8–4.8)
Alkaline Phosphatase: 76 IU/L (ref 39–117)
BUN/Creatinine Ratio: 24 (ref 12–28)
BUN: 23 mg/dL (ref 8–27)
Bilirubin Total: 0.3 mg/dL (ref 0.0–1.2)
CO2: 27 mmol/L (ref 20–29)
Calcium: 10.4 mg/dL — ABNORMAL HIGH (ref 8.7–10.3)
Chloride: 101 mmol/L (ref 96–106)
Creatinine, Ser: 0.97 mg/dL (ref 0.57–1.00)
GFR calc Af Amer: 70 mL/min/{1.73_m2} (ref >59)
GFR calc non Af Amer: 61 mL/min/{1.73_m2} (ref >59)
Globulin, Total: 2.3 g/dL (ref 1.5–4.5)
Glucose: 132 mg/dL — ABNORMAL HIGH (ref 65–99)
Potassium: 3.8 mmol/L (ref 3.5–5.2)
Sodium: 142 mmol/L (ref 134–144)
Total Protein: 6.7 g/dL (ref 6.0–8.5)

## 2019-07-03 LAB — CBC WITH DIFFERENTIAL/PLATELET
Basophils Absolute: 0.1 10*3/uL (ref 0.0–0.2)
Basos: 1 %
EOS (ABSOLUTE): 0.1 10*3/uL (ref 0.0–0.4)
Eos: 2 %
Hematocrit: 43.7 % (ref 34.0–46.6)
Hemoglobin: 14.2 g/dL (ref 11.1–15.9)
Immature Grans (Abs): 0 10*3/uL (ref 0.0–0.1)
Immature Granulocytes: 0 %
Lymphocytes Absolute: 2.2 10*3/uL (ref 0.7–3.1)
Lymphs: 36 %
MCH: 29.2 pg (ref 26.6–33.0)
MCHC: 32.5 g/dL (ref 31.5–35.7)
MCV: 90 fL (ref 79–97)
Monocytes Absolute: 0.4 10*3/uL (ref 0.1–0.9)
Monocytes: 7 %
Neutrophils Absolute: 3.3 10*3/uL (ref 1.4–7.0)
Neutrophils: 54 %
Platelets: 295 10*3/uL (ref 150–450)
RBC: 4.87 x10E6/uL (ref 3.77–5.28)
RDW: 12.6 % (ref 11.7–15.4)
WBC: 6.2 10*3/uL (ref 3.4–10.8)

## 2019-07-03 LAB — LIPID PANEL
Chol/HDL Ratio: 3.1 ratio (ref 0.0–4.4)
Cholesterol, Total: 149 mg/dL (ref 100–199)
HDL: 48 mg/dL (ref >39)
LDL Chol Calc (NIH): 70 mg/dL (ref 0–99)
Triglycerides: 187 mg/dL — ABNORMAL HIGH (ref 0–149)
VLDL Cholesterol Cal: 31 mg/dL (ref 5–40)

## 2019-07-03 NOTE — Progress Notes (Addendum)
MEDICARE ANNUAL WELLNESS VISIT  07/03/2019  Telephone Visit Disclaimer This Medicare AWV was conducted by telephone due to national recommendations for restrictions regarding the COVID-19 Pandemic (e.g. social distancing).  I verified, using two identifiers, that I am speaking with Amanda Booker or their authorized healthcare agent. I discussed the limitations, risks, security, and privacy concerns of performing an evaluation and management service by telephone and the potential availability of an in-person appointment in the future. The patient expressed understanding and agreed to proceed.   Subjective:  Amanda Booker is a 69 y.o. female patient of Amanda Booker, Amanda Bo, FNP who had a Medicare Annual Wellness Visit today via telephone. 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 She has 3 grown children that she sees regularly. She reports that she is not physically active. She does attend church when she is able and she enjoys crossword puzzles and word searches.   Patient Care Team: Amanda Spencer, FNP as PCP - General (Family Medicine)  Advanced Directives 07/03/2019 10/03/2013 03/27/2013 06/30/2011  Does Patient Have a Medical Advance Directive? No Patient would like information Patient does not have advance directive Patient would not like information  Would patient like information on creating a medical advance directive? No - Patient declined Advance directive brochure given (Outpatient ONLY) - -  Pre-existing out of facility DNR order (yellow form or pink MOST form) - - No No    Hospital Utilization Over the Past 12 Months: # of hospitalizations or ER visits: 0 # of surgeries: 0  Review of Systems    Patient reports that her overall health is better compared to last year.  History obtained from the patient.  Patient Reported Readings (BP, Pulse, CBG, Weight, etc) none  Pain Assessment Pain : No/denies pain     Current Medications & Allergies  (verified) Allergies as of 07/03/2019   No Known Allergies      Medication List        Accurate as of July 03, 2019  1:51 PM. If you have any questions, ask your nurse or doctor.          albuterol (5 MG/ML) 0.5% nebulizer solution Commonly known as: PROVENTIL Take 0.5 mLs (2.5 mg total) by nebulization every 6 (six) hours as needed for wheezing or shortness of breath.   albuterol 108 (90 Base) MCG/ACT inhaler Commonly known as: VENTOLIN HFA Inhale 2 puffs into the lungs every 6 (six) hours as needed for wheezing or shortness of breath.   amLODipine 10 MG tablet Commonly known as: NORVASC Take 1 tablet (10 mg total) by mouth every morning.   aspirin 81 MG tablet Take 81 mg by mouth every morning.   atorvastatin 40 MG tablet Commonly known as: LIPITOR Take 1 tablet (40 mg total) by mouth every morning.   cloNIDine 0.3 MG tablet Commonly known as: Catapres Take 1 tablet (0.3 mg total) by mouth 2 (two) times daily.   fluticasone-salmeterol 45-21 MCG/ACT inhaler Commonly known as: ADVAIR HFA Inhale 2 puffs into the lungs 2 (two) times daily.   glipiZIDE 10 MG tablet Commonly known as: GLUCOTROL Take 1 tablet (10 mg total) by mouth 2 (two) times daily before a meal.   glucose blood test strip Check BS daily Dx E11.69   hydrochlorothiazide 25 MG tablet Commonly known as: HYDRODIURIL Take 1 tablet (25 mg total) by mouth every morning.   metFORMIN 1000 MG tablet Commonly known as: Glucophage Take 1 tablet (1,000 mg total)  by mouth 2 (two) times daily with a meal.   metoprolol succinate 50 MG 24 hr tablet Commonly known as: TOPROL-XL Take 2 tablets (100 mg total) by mouth daily. Take with or immediately following a meal.   quinapril 40 MG tablet Commonly known as: ACCUPRIL TAKE 1 TABLET BY MOUTH ONCE DAILY IN THE MORNING   traZODone 50 MG tablet Commonly known as: DESYREL Take 0.5-1 tablets (25-50 mg total) by mouth at bedtime as needed for sleep.         History (reviewed): 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HCC)    Cerebral ventricular shunt fitting or adjustment    Chronic kidney disease    Coagulopathy (HCC)    Diabetes mellitus    Hypertension    Hypokalemia    Kidney stone    Renal artery stenosis (HCC)    Renal artery stenosis (HCC)    Past Surgical History:  Procedure Laterality Date   ABDOMINAL HYSTERECTOMY  2007   cerebral shunt     Family History  Problem Relation Age of Onset   Cancer Mother        uterine   Hyperlipidemia Mother    Cerebral aneurysm Sister    Hypertension Sister    Kidney failure Sister    Diabetes Brother    Hypertension Brother    Diabetes Brother    Hypertension Brother    Social History   Socioeconomic History   Marital status: Widowed    Spouse name: Not on file   Number of children: 3   Years of education: Not on file   Highest education level: 9th grade  Occupational History   Occupation: Designer, fashion/clothing    Comment: retired  Tobacco Use   Smoking status: Current Every Day Smoker    Packs/day: 0.75    Years: 60.00    Pack years: 45.00    Types: Cigarettes   Smokeless tobacco: Never Used  Substance and Sexual Activity   Alcohol use: No   Drug use: No   Sexual activity: Not Currently  Other Topics Concern   Not on file  Social History Narrative   Not on file   Social Determinants of Health   Financial Resource Strain:    Difficulty of Paying Living Expenses:   Food Insecurity:    Worried About Programme researcher, broadcasting/film/video in the Last Year:    Barista in the Last Year:   Transportation Needs:    Freight forwarder (Medical):    Lack of Transportation (Non-Medical):   Physical Activity:    Days of Exercise per Week:    Minutes of Exercise per Session:   Stress:    Feeling of Stress :   Social Connections:    Frequency of Communication with Friends and Family:    Frequency of Social Gatherings with Friends and Family:     Attends Religious Services:    Active Member of Clubs or Organizations:    Attends Banker Meetings:    Marital Status:     Activities of Daily Living In your present state of health, do you have any difficulty performing the following activities: 07/03/2019  Hearing? Y  Comment sometimes  Vision? N  Difficulty concentrating or making decisions? Y  Comment sometimes remembering  Walking or climbing stairs? Y  Comment difficulty since stroke  Dressing or bathing? N  Doing errands, shopping? Y  Comment sends daughter, patient does not Engineer, manufacturing and eating ?  N  Using the Toilet? N  In the past six months, have you accidently leaked urine? Y  Comment stress incontinence, wears pad  Do you have problems with loss of bowel control? N  Managing your Medications? N  Managing your Finances? N  Housekeeping or managing your Housekeeping? N  Some recent data might be hidden    Patient Education/ Literacy How often do you need to have someone help you when you read instructions, pamphlets, or other written materials from your doctor or pharmacy?: 3 - Sometimes What is the last grade level you completed in school?: 9th  Exercise Current Exercise Habits: The patient does not participate in regular exercise at present, Exercise limited by: orthopedic condition(s)  Diet Patient reports consuming 3 meals a day and 1 snack(s) a day Patient reports that her primary diet is: Regular Patient reports that she does have regular access to food.   Depression Screen PHQ 2/9 Scores 07/03/2019 07/02/2019 04/02/2019 12/11/2018  PHQ - 2 Score 0 0 0 0     Fall Risk Fall Risk  07/03/2019 07/02/2019 04/02/2019 12/11/2018  Falls in the past year? 0 0 0 1  Number falls in past yr: - - - 0  Injury with Fall? - - - 0     Objective:  [place] seemed alert and oriented and she participated appropriately during our telephone visit.  Blood Pressure Weight BMI  BP Readings from  Last 3 Encounters:  07/02/19 136/76  04/02/19 (!) 172/86  12/11/18 (!) 152/74   Wt Readings from Last 3 Encounters:  07/02/19 173 lb (78.5 kg)  04/02/19 170 lb (77.1 kg)  12/11/18 175 lb (79.4 kg)   BMI Readings from Last 1 Encounters:  07/02/19 27.10 kg/m    *Unable to obtain current vital signs, weight, and BMI due to telephone visit type  Hearing/Vision  Dustin did not seem to have difficulty with hearing/understanding during the telephone conversation Reports that she has not had a formal eye exam by an eye care professional within the past year Reports that she has not had a formal hearing evaluation within the past year *Unable to fully assess hearing and vision during telephone visit type  Cognitive Function: 6CIT Screen 07/03/2019  What Year? 0 points  What month? 0 points  What time? 0 points  Count back from 20 0 points  Months in reverse 0 points  Repeat phrase 6 points  Total Score 6   (Normal:0-7, Significant for Dysfunction: >8)  Normal Cognitive Function Screening: Yes   Immunization & Health Maintenance Record Immunization History  Administered Date(s) Administered   Fluad Quad(high Dose 65+) 04/02/2019   Pneumococcal Conjugate-13 03/28/2018   Pneumococcal Polysaccharide-23 07/08/2015    Health Maintenance  Topic Date Due   OPHTHALMOLOGY EXAM  06/30/2016   COLONOSCOPY  08/02/2019 (Originally 10/02/2002)   TETANUS/TDAP  06/30/2020 (Originally 10/02/1971)   Hepatitis C Screening  06/30/2020 (Originally 1951-01-17)   FOOT EXAM  12/11/2019   HEMOGLOBIN A1C  01/02/2020   PNA vac Low Risk Adult (2 of 2 - PPSV23) 07/07/2020   MAMMOGRAM  07/10/2020   INFLUENZA VACCINE  Completed   DEXA SCAN  Completed       Assessment  This is a routine wellness examination for [employer].  Health Maintenance: Due or Overdue Health Maintenance Due  Topic Date Due   OPHTHALMOLOGY EXAM  06/30/2016    Kai Railsback does not need a referral for Community  Assistance: Care Management:   no Social  Work:    no Prescription Assistance:  no Nutrition/Diabetes Education:  no   Plan:  Personalized Goals Goals Addressed             This Visit's Progress    Patient Stated       07/03/2019 AWV Goal: Exercise for General Health  Patient will verbalize understanding of the benefits of increased physical activity: Exercising regularly is important. It will improve your overall fitness, flexibility, and endurance. Regular exercise also will improve your overall health. It can help you control your weight, reduce stress, and improve your bone density. Over the next year, patient will increase physical activity as tolerated with a goal of at least 150 minutes of moderate physical activity per week.  You can tell that you are exercising at a moderate intensity if your heart starts beating faster and you start breathing faster but can still hold a conversation. Moderate-intensity exercise ideas include: Walking 1 mile (1.6 km) in about 15 minutes Biking Hiking Golfing Dancing Water aerobics Patient will verbalize understanding of everyday activities that increase physical activity by providing examples like the following: Yard work, such as: Insurance underwriter Gardening Washing windows or floors Patient will be able to explain general safety guidelines for exercising:  Before you start a new exercise program, talk with your health care provider. Do not exercise so much that you hurt yourself, feel dizzy, or get very short of breath. Wear comfortable clothes and wear shoes with good support. Drink plenty of water while you exercise to prevent dehydration or heat stroke. Work out until your breathing and your heartbeat get faster.        Personalized Health Maintenance & Screening Recommendations  Eye exam  Lung Cancer Screening Recommended: yes (Low Dose  CT Chest recommended if Age 67-80 years, 30 pack-year currently smoking OR have quit w/in past 15 years) Hepatitis C Screening recommended: no HIV Screening recommended: no  Advanced Directives: Written information was not prepared per patient's request.  Referrals & Orders No orders of the defined types were placed in this encounter.   Follow-up Plan Follow-up with Amanda Spencer, FNP as planned Schedule eye exam    I have personally reviewed and noted the following in the patient's chart:   Medical and social history Use of alcohol, tobacco or illicit drugs  Current medications and supplements Functional ability and status Nutritional status Physical activity Advanced directives List of other physicians Hospitalizations, surgeries, and ER visits in previous 12 months Vitals Screenings to include cognitive, depression, and falls Referrals and appointments  In addition, I have reviewed and discussed with Amanda Booker certain preventive protocols, quality metrics, and best practice recommendations. A written personalized care plan for preventive services as well as general preventive health recommendations is available and can be mailed to the patient at her request.      Adella Hare, LPN 5/46/2703    I have reviewed and agree with the above AWV documentation.   Jannifer Rodney, FNP

## 2019-07-31 ENCOUNTER — Telehealth: Payer: Self-pay | Admitting: *Deleted

## 2019-07-31 NOTE — Telephone Encounter (Signed)
Fax from [facility] Glipizide ER 5mg  2 tabs BID Note from pharmacy: Insurance will only cover 3

## 2019-08-04 ENCOUNTER — Ambulatory Visit: Payer: Medicare HMO | Attending: Internal Medicine

## 2019-08-04 DIAGNOSIS — Z23 Encounter for immunization: Secondary | ICD-10-CM

## 2019-08-04 NOTE — Progress Notes (Signed)
   Covid-19 Vaccination Clinic  Name:  Amanda Booker    MRN: [id number] DOB: [birth year]  08/04/2019  [name] was observed post Covid-19 immunization for 15 minutes without incident. She was provided with Vaccine Information Sheet and instruction to access the V-Safe system.   [name] was instructed to call 911 with any severe reactions post vaccine: Marland Kitchen Difficulty breathing  . Swelling of face and throat  . A fast heartbeat  . A bad rash all over body  . Dizziness and weakness   Immunizations Administered    Name Date Dose VIS Date Route   Pfizer COVID-19 Vaccine 08/04/2019 12:16 PM 0.3 mL 04/04/2019 Intramuscular   Manufacturer: [employer], [employer]   Lot: [id number]   NDC: [address]-2

## 2019-08-25 ENCOUNTER — Ambulatory Visit: Payer: Medicare HMO | Attending: Internal Medicine

## 2019-08-25 DIAGNOSIS — Z23 Encounter for immunization: Secondary | ICD-10-CM

## 2019-08-25 NOTE — Progress Notes (Signed)
   Covid-19 Vaccination Clinic  Name:  Amanda Booker    MRN: [id number] DOB: 1950/11/13  08/25/2019  Amanda Booker was observed post Covid-19 immunization for 15 minutes without incident. She was provided with Vaccine Information Sheet and instruction to access the V-Safe system.   Amanda Booker was instructed to call 911 with any severe reactions post vaccine: Marland Kitchen Difficulty breathing  . Swelling of face and throat  . A fast heartbeat  . A bad rash all over body  . Dizziness and weakness   Immunizations Administered    Name Date Dose VIS Date Route   Pfizer COVID-19 Vaccine 08/25/2019  1:01 PM 0.3 mL 06/18/2018 Intramuscular   Manufacturer: [employer], [employer]   Lot: [id number]   NDC: [address]-6

## 2019-09-08 ENCOUNTER — Telehealth: Payer: Self-pay

## 2019-09-08 NOTE — Telephone Encounter (Signed)
Glipizide 5mg  was APPROVED.  Authorized coverage from 04/25/2019-04/23/2020.   Patient is currently taking 10mg .

## 2019-09-08 NOTE — Telephone Encounter (Signed)
PA for glipizide 5mg  received from pharmacy.   KEY  PA Case ID: Jacqlyn Larsen Rx #[id number]   PA started on cover my meds.

## 2019-09-08 NOTE — Telephone Encounter (Signed)
I called [facility] and the 5 mg Glipizide is now deactivated for this pt - Pharmacist states that this should clear up the confusion with the insurance coverage / PA , ETC..   She will remain on the 10 mg that was started in March, 2021.

## 2019-09-18 DIAGNOSIS — R69 Illness, unspecified: Secondary | ICD-10-CM | POA: Diagnosis not present

## 2019-10-03 ENCOUNTER — Encounter: Payer: Self-pay | Admitting: Family

## 2019-10-03 ENCOUNTER — Other Ambulatory Visit: Payer: Self-pay

## 2019-10-03 ENCOUNTER — Ambulatory Visit (INDEPENDENT_AMBULATORY_CARE_PROVIDER_SITE_OTHER): Payer: Medicare HMO | Admitting: Family

## 2019-10-03 VITALS — BP 165/81 | HR 69 | Temp 97.6°F | Ht 67.0 in | Wt 163.8 lb

## 2019-10-03 DIAGNOSIS — E785 Hyperlipidemia, unspecified: Secondary | ICD-10-CM | POA: Diagnosis not present

## 2019-10-03 DIAGNOSIS — J449 Chronic obstructive pulmonary disease, unspecified: Secondary | ICD-10-CM | POA: Diagnosis not present

## 2019-10-03 DIAGNOSIS — I69354 Hemiplegia and hemiparesis following cerebral infarction affecting left non-dominant side: Secondary | ICD-10-CM

## 2019-10-03 DIAGNOSIS — I152 Hypertension secondary to endocrine disorders: Secondary | ICD-10-CM

## 2019-10-03 DIAGNOSIS — Z1159 Encounter for screening for other viral diseases: Secondary | ICD-10-CM | POA: Diagnosis not present

## 2019-10-03 DIAGNOSIS — E1159 Type 2 diabetes mellitus with other circulatory complications: Secondary | ICD-10-CM

## 2019-10-03 DIAGNOSIS — E1169 Type 2 diabetes mellitus with other specified complication: Secondary | ICD-10-CM | POA: Diagnosis not present

## 2019-10-03 DIAGNOSIS — I10 Essential (primary) hypertension: Secondary | ICD-10-CM

## 2019-10-03 DIAGNOSIS — R69 Illness, unspecified: Secondary | ICD-10-CM | POA: Diagnosis not present

## 2019-10-03 DIAGNOSIS — I701 Atherosclerosis of renal artery: Secondary | ICD-10-CM | POA: Diagnosis not present

## 2019-10-03 DIAGNOSIS — J4489 Other specified chronic obstructive pulmonary disease: Secondary | ICD-10-CM

## 2019-10-03 DIAGNOSIS — F17210 Nicotine dependence, cigarettes, uncomplicated: Secondary | ICD-10-CM

## 2019-10-03 LAB — BAYER DCA HB A1C WAIVED: HB A1C (BAYER DCA - WAIVED): 7.5 % — ABNORMAL HIGH (ref <7.0)

## 2019-10-03 MED ORDER — METFORMIN HCL 1000 MG PO TABS: 1000.0000 mg | ORAL_TABLET | Freq: Two times a day (BID) | ORAL | 3 refills | Status: DC

## 2019-10-03 MED ORDER — AMLODIPINE BESYLATE 10 MG PO TABS: 10.0000 mg | ORAL_TABLET | Freq: Every morning | ORAL | 1 refills | Status: DC

## 2019-10-03 MED ORDER — ATORVASTATIN CALCIUM 40 MG PO TABS: 40.0000 mg | ORAL_TABLET | Freq: Every morning | ORAL | 1 refills | Status: DC

## 2019-10-03 MED ORDER — METOPROLOL SUCCINATE ER 50 MG PO TB24: 100.0000 mg | ORAL_TABLET | Freq: Every day | ORAL | 1 refills | Status: DC

## 2019-10-03 MED ORDER — FLUTICASONE-SALMETEROL 45-21 MCG/ACT IN AERO: 2.0000 | INHALATION_SPRAY | Freq: Two times a day (BID) | RESPIRATORY_TRACT | 12 refills | Status: DC

## 2019-10-03 MED ORDER — GLIPIZIDE 10 MG PO TABS: 10.0000 mg | ORAL_TABLET | Freq: Two times a day (BID) | ORAL | 1 refills | Status: DC

## 2019-10-03 MED ORDER — CLONIDINE HCL 0.3 MG PO TABS: 0.3000 mg | ORAL_TABLET | Freq: Two times a day (BID) | ORAL | 1 refills | Status: DC

## 2019-10-03 MED ORDER — QUINAPRIL HCL 40 MG PO TABS: ORAL_TABLET | ORAL | 1 refills | Status: DC

## 2019-10-03 MED ORDER — ALBUTEROL SULFATE HFA 108 (90 BASE) MCG/ACT IN AERS: 2.0000 | INHALATION_SPRAY | Freq: Four times a day (QID) | RESPIRATORY_TRACT | 11 refills | Status: DC | PRN

## 2019-10-03 MED ORDER — HYDROCHLOROTHIAZIDE 25 MG PO TABS: 25.0000 mg | ORAL_TABLET | Freq: Every morning | ORAL | 1 refills | Status: DC

## 2019-10-03 NOTE — Patient Instructions (Signed)

## 2019-10-03 NOTE — Progress Notes (Signed)
Subjective:    Patient ID: Amanda Booker, female    DOB: 1951-01-08, 69 y.o.   MRN: [id number]  Chief Complaint  Patient presents with  . Medical Management of Chronic Issues  . Diabetes   Pt presents to the office today to establish care. 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  Diabetes She presents for her follow-up diabetic visit. She has type 2 diabetes mellitus. There are no hypoglycemic associated symptoms. Pertinent negatives for diabetes include no blurred vision and no foot paresthesias. Symptoms are stable. Diabetic complications include a CVA. Risk factors for coronary artery disease include dyslipidemia, diabetes mellitus, hypertension, sedentary lifestyle and post-menopausal. She is following a generally healthy diet. Her overall blood glucose range is 110-130 mg/dl. An ACE inhibitor/angiotensin II receptor blocker is being taken. Eye exam is not current.  Hypertension This is a chronic problem. The current episode started more than 1 year ago. The problem has been waxing and waning since onset. The problem is uncontrolled. Associated symptoms include malaise/fatigue. Pertinent negatives include no blurred vision, peripheral edema or shortness of breath. Risk factors for coronary artery disease include dyslipidemia, diabetes mellitus, sedentary lifestyle and smoking/tobacco exposure. The current treatment provides mild improvement. Hypertensive end-organ damage includes CVA.  Hyperlipidemia This is a chronic problem. The current episode started more than 1 year ago. The problem is controlled. Recent lipid tests were reviewed and are normal. Pertinent negatives include no shortness of breath. Current antihyperlipidemic treatment includes statins. The current treatment provides moderate improvement of lipids. Risk factors for coronary artery disease include dyslipidemia, diabetes mellitus, hypertension, a sedentary lifestyle and post-menopausal.  Nicotine  Dependence Presents for follow-up visit. Her urge triggers include company of smokers. She smokes < 1/2 a pack of cigarettes per day.   COPD PT continues to smoke 1/2 pack a day. Does not use Advair.    Review of Systems  Constitutional: Positive for malaise/fatigue.  Eyes: Negative for blurred vision.  Respiratory: Negative for shortness of breath.   All other systems reviewed and are negative.      Objective:   Physical Exam Vitals reviewed.  Constitutional:      General: She is not in acute distress.    Appearance: She is well-developed.  HENT:     Head: Normocephalic and atraumatic.     Right Ear: Tympanic membrane normal.     Left Ear: Tympanic membrane normal.  Eyes:     Pupils: Pupils are equal, round, and reactive to light.  Neck:     Thyroid: No thyromegaly.  Cardiovascular:     Rate and Rhythm: Normal rate and regular rhythm.     Heart sounds: Normal heart sounds. No murmur heard.   Pulmonary:     Effort: Pulmonary effort is normal. No respiratory distress.     Breath sounds: Normal breath sounds. No wheezing.  Abdominal:     General: Bowel sounds are normal. There is no distension.     Palpations: Abdomen is soft.     Tenderness: There is no abdominal tenderness.  Musculoskeletal:        General: No tenderness. Normal range of motion.     Cervical back: Normal range of motion and neck supple.  Skin:    General: Skin is warm and dry.  Neurological:     Mental Status: She is alert and oriented to person, place, and time.     Cranial Nerves: No cranial nerve deficit.     Motor: Weakness present.  Deep Tendon Reflexes: Reflexes are normal and symmetric.  Psychiatric:        Behavior: Behavior normal.        Thought Content: Thought content normal.        Judgment: Judgment normal.        BP (!) 165/81   Pulse 69   Temp 97.6 F (36.4 C) (Temporal)   Ht _0  (1.702 m)   Wt 163 lb 12.8 oz (74.3 kg)   SpO2 96%   BMI 25.65 kg/m   Assessment &  Plan:  [name] comes in today with chief complaint of Medical Management of Chronic Issues and Diabetes   Diagnosis and orders addressed:  1. COPD (chronic obstructive pulmonary disease) with chronic bronchitis (HCC) - albuterol (VENTOLIN HFA) 108 (90 Base) MCG/ACT inhaler; Inhale 2 puffs into the lungs every 6 (six) hours as needed for wheezing or shortness of breath.  Dispense: 18 g; Refill: 11 - fluticasone-salmeterol (ADVAIR HFA) 45-21 MCG/ACT inhaler; Inhale 2 puffs into the lungs 2 (two) times daily.  Dispense: 1 Inhaler; Refill: 12 - CMP14+EGFR - CBC with Differential/Platelet  2. DM type 2 with diabetic dyslipidemia (HCC) - Bayer DCA Hb A1c Waived - metFORMIN (GLUCOPHAGE) 1000 MG tablet; Take 1 tablet (1,000 mg total) by mouth 2 (two) times daily with a meal.  Dispense: 180 tablet; Refill: 3 - glipiZIDE (GLUCOTROL) 10 MG tablet; Take 1 tablet (10 mg total) by mouth 2 (two) times daily before a meal.  Dispense: 180 tablet; Refill: 1 - CMP14+EGFR - CBC with Differential/Platelet - Ambulatory referral to Cardiology  3. Hyperlipidemia associated with type 2 diabetes mellitus (HCC) - atorvastatin (LIPITOR) 40 MG tablet; Take 1 tablet (40 mg total) by mouth every morning.  Dispense: 90 tablet; Refill: 1 - CMP14+EGFR - CBC with Differential/Platelet - Ambulatory referral to Cardiology  4. Hypertension associated with diabetes ([place]) - quinapril (ACCUPRIL) 40 MG tablet; TAKE 1 TABLET BY MOUTH ONCE DAILY IN THE MORNING  Dispense: 90 tablet; Refill: 1 - metoprolol succinate (TOPROL-XL) 50 MG 24 hr tablet; Take 2 tablets (100 mg total) by mouth daily. Take with or immediately following a meal.  Dispense: 180 tablet; Refill: 1 - hydrochlorothiazide (HYDRODIURIL) 25 MG tablet; Take 1 tablet (25 mg total) by mouth every morning.  Dispense: 90 tablet; Refill: 1 - cloNIDine (CATAPRES) 0.3 MG tablet; Take 1 tablet (0.3 mg total) by mouth 2 (two) times daily.  Dispense: 180 tablet;  Refill: 1 - amLODipine (NORVASC) 10 MG tablet; Take 1 tablet (10 mg total) by mouth every morning.  Dispense: 90 tablet; Refill: 1 - CMP14+EGFR - CBC with Differential/Platelet - Ambulatory referral to Cardiology  5. Atherosclerosis of renal artery (HCC) - CMP14+EGFR - CBC with Differential/Platelet - Ambulatory referral to Cardiology  6. Renal artery stenosis (HCC) - CMP14+EGFR - CBC with Differential/Platelet - Ambulatory referral to Cardiology  7. Hemiparesis affecting left side as late effect of cerebrovascular accident (CVA) ([place]) - CMP14+EGFR - CBC with Differential/Platelet  8. Cigarette nicotine dependence without complication - KXF81+WEXH - CBC with Differential/Platelet - Ambulatory referral to Cardiology  9. Need for hepatitis C screening test - CMP14+EGFR - CBC with Differential/Platelet - Hepatitis C antibody   Labs pending Health Maintenance reviewed Diet and exercise encouraged  Follow up plan: 3 months    Evelina Dun, FNP

## 2019-10-04 LAB — CBC WITH DIFFERENTIAL/PLATELET
Basophils Absolute: 0.1 10*3/uL (ref 0.0–0.2)
Basos: 1 %
EOS (ABSOLUTE): 0.3 10*3/uL (ref 0.0–0.4)
Eos: 5 %
Hematocrit: 40.7 % (ref 34.0–46.6)
Hemoglobin: 13.4 g/dL (ref 11.1–15.9)
Immature Grans (Abs): 0 10*3/uL (ref 0.0–0.1)
Immature Granulocytes: 0 %
Lymphocytes Absolute: 2.2 10*3/uL (ref 0.7–3.1)
Lymphs: 33 %
MCH: 29.1 pg (ref 26.6–33.0)
MCHC: 32.9 g/dL (ref 31.5–35.7)
MCV: 89 fL (ref 79–97)
Monocytes Absolute: 0.4 10*3/uL (ref 0.1–0.9)
Monocytes: 6 %
Neutrophils Absolute: 3.6 10*3/uL (ref 1.4–7.0)
Neutrophils: 55 %
Platelets: 334 10*3/uL (ref 150–450)
RBC: 4.6 x10E6/uL (ref 3.77–5.28)
RDW: 13 % (ref 11.7–15.4)
WBC: 6.7 10*3/uL (ref 3.4–10.8)

## 2019-10-04 LAB — CMP14+EGFR
ALT: 12 IU/L (ref 0–32)
AST: 12 IU/L (ref 0–40)
Albumin/Globulin Ratio: 1.8 (ref 1.2–2.2)
Albumin: 4 g/dL (ref 3.8–4.8)
Alkaline Phosphatase: 57 IU/L (ref 48–121)
BUN/Creatinine Ratio: 28 (ref 12–28)
BUN: 22 mg/dL (ref 8–27)
Bilirubin Total: 0.3 mg/dL (ref 0.0–1.2)
CO2: 26 mmol/L (ref 20–29)
Calcium: 10.3 mg/dL (ref 8.7–10.3)
Chloride: 100 mmol/L (ref 96–106)
Creatinine, Ser: 0.79 mg/dL (ref 0.57–1.00)
GFR calc Af Amer: 90 mL/min/{1.73_m2} (ref >59)
GFR calc non Af Amer: 78 mL/min/{1.73_m2} (ref >59)
Globulin, Total: 2.2 g/dL (ref 1.5–4.5)
Glucose: 128 mg/dL — ABNORMAL HIGH (ref 65–99)
Potassium: 3.3 mmol/L — ABNORMAL LOW (ref 3.5–5.2)
Sodium: 141 mmol/L (ref 134–144)
Total Protein: 6.2 g/dL (ref 6.0–8.5)

## 2019-10-04 LAB — HEPATITIS C ANTIBODY: Hep C Virus Ab: <0.1 s/co ratio (ref 0.0–0.9)

## 2019-10-06 ENCOUNTER — Other Ambulatory Visit: Payer: Self-pay | Admitting: Family

## 2019-10-06 MED ORDER — POTASSIUM CHLORIDE CRYS ER 20 MEQ PO TBCR: 20.0000 meq | EXTENDED_RELEASE_TABLET | Freq: Every day | ORAL | 2 refills | Status: DC

## 2019-11-04 ENCOUNTER — Encounter: Payer: Self-pay | Admitting: Cardiology

## 2019-11-04 NOTE — Progress Notes (Signed)
Cardiology Office Note   Date:  11/05/2019   ID:  Xavier, Fournier [birth year], MRN [id number]  PCP:  Junie Spencer, FNP  Cardiologist:   No primary care provider on file.   Chief Complaint  Patient presents with  . Heart Murmur      History of Present Illness: Amanda Booker is a 69 y.o. female who is referred by Junie Spencer, FNP for evaluation of multiple cardiovascular risk factors and PVD and a heart murmur.   She had bilateral less than 60% RAS on Doppler in 2015.    She otherwise has not had significant cardiovascular problems.  She lives with her daughter who is a Engineer, civil (consulting).  She does some light housekeeping and grocery shopping and yard sales.  The patient denies any new symptoms such as chest discomfort, neck or arm discomfort. There has been no new shortness of breath, PND or orthopnea. There have been no reported palpitations, presyncope or syncope.  She does have COPD and takes bronchodilators and was hospitalized in the distant past for COPD flare but she actually does well she says with her breathing on her medicines.  She was noted recently to have a heart murmur and has never been told about this before.  Past Medical History:  Diagnosis Date  . Cerebral aneurysm rupture (HCC)   . Cerebral ventricular shunt fitting or adjustment   . Chronic kidney disease   . Coagulopathy (HCC)   . COPD (chronic obstructive pulmonary disease) (HCC)   . Diabetes mellitus   . Hypertension   . Hypokalemia   . Kidney stone   . Renal artery stenosis [facility])     Past Surgical History:  Procedure Laterality Date  . ABDOMINAL HYSTERECTOMY  2007  . cerebral shunt       Current Outpatient Medications  Medication Sig Dispense Refill  . albuterol (PROVENTIL) (5 MG/ML) 0.5% nebulizer solution Take 0.5 mLs (2.5 mg total) by nebulization every 6 (six) hours as needed for wheezing or shortness of breath. 20 mL 12  . albuterol (VENTOLIN HFA) 108 (90 Base) MCG/ACT inhaler  Inhale 2 puffs into the lungs every 6 (six) hours as needed for wheezing or shortness of breath. 18 g 11  . amLODipine (NORVASC) 10 MG tablet Take 1 tablet (10 mg total) by mouth every morning. 90 tablet 1  . aspirin 81 MG tablet Take 81 mg by mouth every morning.     Marland Kitchen atorvastatin (LIPITOR) 40 MG tablet Take 1 tablet (40 mg total) by mouth every morning. 90 tablet 1  . cloNIDine (CATAPRES) 0.3 MG tablet Take 1 tablet (0.3 mg total) by mouth 2 (two) times daily. 180 tablet 1  . fluticasone-salmeterol (ADVAIR HFA) 45-21 MCG/ACT inhaler Inhale 2 puffs into the lungs 2 (two) times daily. 1 Inhaler 12  . glipiZIDE (GLUCOTROL) 10 MG tablet Take 1 tablet (10 mg total) by mouth 2 (two) times daily before a meal. 180 tablet 1  . hydrochlorothiazide (HYDRODIURIL) 25 MG tablet Take 1 tablet (25 mg total) by mouth every morning. 90 tablet 1  . metFORMIN (GLUCOPHAGE) 1000 MG tablet Take 1 tablet (1,000 mg total) by mouth 2 (two) times daily with a meal. 180 tablet 3  . metoprolol succinate (TOPROL-XL) 50 MG 24 hr tablet Take 2 tablets (100 mg total) by mouth daily. Take with or immediately following a meal. 180 tablet 1  . potassium chloride SA (KLOR-CON) 20 MEQ tablet Take 1 tablet (20 mEq total) by mouth daily.  30 tablet 2  . quinapril (ACCUPRIL) 40 MG tablet TAKE 1 TABLET BY MOUTH ONCE DAILY IN THE MORNING 90 tablet 1  . glucose blood test strip Check BS daily Dx E11.69 100 each 3  . spironolactone (ALDACTONE) 25 MG tablet Take 1 tablet (25 mg total) by mouth daily. 90 tablet 3   No current facility-administered medications for this visit.    Allergies:   Patient has no known allergies.    Social History:  The patient  reports that she has been smoking cigarettes. She has a 45.00 pack-year smoking history. She has never used smokeless tobacco. She reports that she does not drink alcohol and does not use drugs.   Family History:  The patient's family history includes Cancer in her mother; Cerebral  aneurysm in her sister; Diabetes in her brother and brother; Hyperlipidemia in her mother; Hypertension in her brother, brother, and sister; Kidney failure in her sister.    ROS:  Please see the history of present illness.   Otherwise, review of systems are positive for none.   All other systems are reviewed and negative.    PHYSICAL EXAM: VS:  BP (!) 190/88   Pulse 89   Ht 5\' 7"  (1.702 m)   Wt 163 lb (73.9 kg)   BMI 25.53 kg/m  , BMI Body mass index is 25.53 kg/m. GENERAL:  Well appearing HEENT:  Pupils equal round and reactive, fundi not visualized, oral mucosa unremarkable NECK:  No jugular venous distention, waveform within normal limits, carotid upstroke brisk and symmetric, no bruits, no thyromegaly LYMPHATICS:  No cervical, inguinal adenopathy LUNGS:  Clear to auscultation bilaterally BACK:  No CVA tenderness CHEST:  Unremarkable HEART:  PMI not displaced or sustained,S1 and S2 within normal limits, no S3, no S4, no clicks, no rubs, 3 out of 6 systolic murmur heard at the apex and radiating to the axilla, slightly honking, mid peaking, radiating up to the carotids, no change with Valsalva, no diastolic murmurs ABD:  Flat, positive bowel sounds normal in frequency in pitch, no bruits, no rebound, no guarding, no midline pulsatile mass, no hepatomegaly, no splenomegaly EXT:  2 plus pulses throughout, no edema, no cyanosis no clubbing SKIN:  No rashes no nodules NEURO:  Cranial nerves II through XII grossly intact, motor grossly intact throughout PSYCH:  Cognitively intact, oriented to person place and time    EKG:  EKG is ordered today. The ekg ordered today demonstrates sinus rhythm, rate 89, axis within normal limits, intervals within normal limits, none specific lateral T wave changes.   Recent Labs: 12/11/2018: TSH 1.420 10/03/2019: ALT 12; BUN 22; Creatinine, Ser 0.79; Hemoglobin 13.4; Platelets 334; Potassium 3.3; Sodium 141    Lipid Panel    Component Value  Date/Time   CHOL 149 07/02/2019 1545   TRIG 187 (H) 07/02/2019 1545   HDL 48 07/02/2019 1545   CHOLHDL 3.1 07/02/2019 1545   LDLCALC 70 07/02/2019 1545      Wt Readings from Last 3 Encounters:  11/05/19 163 lb (73.9 kg)  10/03/19 163 lb 12.8 oz (74.3 kg)  07/02/19 173 lb (78.5 kg)      Other studies Reviewed: Additional studies/ records that were reviewed today include: Labs. Review of the above records demonstrates:  Please see elsewhere in the note.     ASSESSMENT AND PLAN:  HTN:   Her blood pressure is not well controlled.  I am going to add spironolactone.  I think because of low potassiums I will leave  her on her potassium supplement as well as ACE inhibitor.  She will remain on the other meds.  I will check a basic metabolic profile in 2 weeks and then again 4 weeks after that.  DM: Her A1c was greater than 9 and is down to 7.  I will defer to Jannifer Rodney A, FNP  DYSLIPIDEMIA:   LDL 78. HDL 48.  She will continue the meds as listed.  RAS: I might reassess this in the future although this was not assessed to be the cause of her difficult to control hypertension.  MURMUR: I suspect that this is aortic stenosis although it radiates to the axilla.  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     TOBACCO ABUSE: She has no intention of quitting smoking.  RISK REDUCTION: I would like to assess with POET (Plain Old Exercise Treadmill) in the future given her multiple risk factors but we would have to wait till her blood pressure is better controlled.  COVID EDUCATION: She has been vaccinated.  Current medicines are reviewed at length with the patient today.  The patient does not have concerns regarding medicines.  The following changes have been made: As above.  Labs/ tests ordered today include:   Orders Placed This Encounter  Procedures  . Basic metabolic panel  . Basic metabolic panel  . EKG 12-Lead  . ECHOCARDIOGRAM COMPLETE     Disposition:   FU with me in  3 months.     Signed, Rollene Rotunda, MD  11/05/2019 3:11 PM    [facility]

## 2019-11-05 ENCOUNTER — Encounter: Payer: Self-pay | Admitting: Cardiology

## 2019-11-05 ENCOUNTER — Other Ambulatory Visit: Payer: Self-pay

## 2019-11-05 ENCOUNTER — Ambulatory Visit (INDEPENDENT_AMBULATORY_CARE_PROVIDER_SITE_OTHER): Payer: Medicare HMO | Admitting: Cardiology

## 2019-11-05 VITALS — BP 190/88 | HR 89 | Ht 67.0 in | Wt 163.0 lb

## 2019-11-05 DIAGNOSIS — E118 Type 2 diabetes mellitus with unspecified complications: Secondary | ICD-10-CM | POA: Diagnosis not present

## 2019-11-05 DIAGNOSIS — Z720 Tobacco use: Secondary | ICD-10-CM | POA: Diagnosis not present

## 2019-11-05 DIAGNOSIS — I10 Essential (primary) hypertension: Secondary | ICD-10-CM | POA: Diagnosis not present

## 2019-11-05 DIAGNOSIS — I701 Atherosclerosis of renal artery: Secondary | ICD-10-CM

## 2019-11-05 DIAGNOSIS — Z79899 Other long term (current) drug therapy: Secondary | ICD-10-CM | POA: Diagnosis not present

## 2019-11-05 DIAGNOSIS — R011 Cardiac murmur, unspecified: Secondary | ICD-10-CM | POA: Diagnosis not present

## 2019-11-05 DIAGNOSIS — E785 Hyperlipidemia, unspecified: Secondary | ICD-10-CM | POA: Diagnosis not present

## 2019-11-05 MED ORDER — SPIRONOLACTONE 25 MG PO TABS
25.0000 mg | ORAL_TABLET | Freq: Every day | ORAL | 3 refills | Status: DC
Start: 2019-11-05 — End: 2020-06-08

## 2019-11-05 NOTE — Patient Instructions (Signed)
Medication Instructions:  Please start Spironolactone 25 mg a day. Continue all other medications as listed.  *If you need a refill on your cardiac medications before your next appointment, please call your pharmacy*  Lab Work: Please have blood work in 2 weeks and in 6 weeks at [facility]. If you have labs (blood work) drawn today and your tests are completely normal, you will receive your results only by: Marland Kitchen MyChart Message (if you have MyChart) OR . A paper copy in the mail If you have any lab test that is abnormal or we need to change your treatment, we will call you to review the results.   Testing/Procedures: Your physician has requested that you have an echocardiogram at [facility]. Echocardiography is a painless test that uses sound waves to create images of your heart. It provides your doctor with information about the size and shape of your heart and how well your heart's chambers and valves are working. This procedure takes approximately one hour. There are no restrictions for this procedure.  Follow-Up: At [facility], you and your health needs are our priority.  As part of our continuing mission to provide you with exceptional heart care, we have created designated Provider Care Teams.  These Care Teams include your primary Cardiologist (physician) and Advanced Practice Providers (APPs -  Physician Assistants and Nurse Practitioners) who all work together to provide you with the care you need, when you need it.  We recommend signing up for the patient portal called "MyChart".  Sign up information is provided on this After Visit Summary.  MyChart is used to connect with patients for Virtual Visits (Telemedicine).  Patients are able to view lab/test results, encounter notes, upcoming appointments, etc.  Non-urgent messages can be sent to your provider as well.   To learn more about what you can do with MyChart, go to [email or web address].    Your next appointment:   3  month(s)  The format for your next appointment:   In Person  Provider:   Rollene Rotunda, MD   Thank you for choosing [facility]!!

## 2019-11-13 ENCOUNTER — Other Ambulatory Visit: Payer: Self-pay

## 2019-11-13 ENCOUNTER — Ambulatory Visit (HOSPITAL_COMMUNITY)
Admission: RE | Admit: 2019-11-13 | Discharge: 2019-11-13 | Disposition: A | Discharge status: Home / Self Care | Payer: Medicare HMO | Source: Ambulatory Visit | Attending: Family | Admitting: Family

## 2019-11-13 DIAGNOSIS — R011 Cardiac murmur, unspecified: Secondary | ICD-10-CM | POA: Diagnosis not present

## 2019-11-13 DIAGNOSIS — I10 Essential (primary) hypertension: Secondary | ICD-10-CM | POA: Insufficient documentation

## 2019-11-13 LAB — ECHOCARDIOGRAM COMPLETE
AR max vel: 1.09 cm2
AV Area VTI: 1.12 cm2
AV Area mean vel: 1.21 cm2
AV Mean grad: 13.7 mmHg
AV Peak grad: 26.3 mmHg
Ao pk vel: 2.56 m/s
Area-P 1/2: 2.97 cm2
S' Lateral: 2.29 cm

## 2019-11-13 NOTE — Progress Notes (Signed)
*  PRELIMINARY RESULTS* Echocardiogram 2D Echocardiogram has been performed.  Stacey Drain 11/13/2019, 3:51 PM

## 2019-11-21 ENCOUNTER — Other Ambulatory Visit: Payer: Medicare HMO

## 2019-11-21 DIAGNOSIS — Z79899 Other long term (current) drug therapy: Secondary | ICD-10-CM | POA: Diagnosis not present

## 2019-11-21 DIAGNOSIS — I10 Essential (primary) hypertension: Secondary | ICD-10-CM | POA: Diagnosis not present

## 2019-11-21 LAB — BASIC METABOLIC PANEL
BUN/Creatinine Ratio: 23 (ref 12–28)
BUN: 19 mg/dL (ref 8–27)
CO2: 22 mmol/L (ref 20–29)
Calcium: 11.1 mg/dL — ABNORMAL HIGH (ref 8.7–10.3)
Chloride: 100 mmol/L (ref 96–106)
Creatinine, Ser: 0.82 mg/dL (ref 0.57–1.00)
GFR calc Af Amer: 86 mL/min/{1.73_m2} (ref >59)
GFR calc non Af Amer: 74 mL/min/{1.73_m2} (ref >59)
Glucose: 146 mg/dL — ABNORMAL HIGH (ref 65–99)
Potassium: 4.6 mmol/L (ref 3.5–5.2)
Sodium: 139 mmol/L (ref 134–144)

## 2019-11-28 ENCOUNTER — Telehealth: Payer: Self-pay | Admitting: Cardiology

## 2019-11-28 ENCOUNTER — Telehealth: Payer: Self-pay | Admitting: *Deleted

## 2019-11-28 NOTE — Telephone Encounter (Signed)
Burnell Blanks, LPN  09/25/1495 0:26 PM EDT Back to Top    Spoke with daughter regarding labs, mother gave verbal permission. Daughter stated she was going to stop her potassium since it was up to 4.6. Tried to discuss with Dr Antoine Poche however she stated she did not want her potassium higher than 4.6. Advised to recheck labs again in couple of weeks and would forward to Dr Antoine Poche for review    Burnell Blanks, LPN  06/29/8586 50:27 PM EDT     Left message to call back    Rollene Rotunda, MD  11/23/2019 2:29 PM EDT     Labs OK except for sugar. Follow up with Junie Spencer, FNP. Call Ms. Sandler with the results and send results to Junie Spencer, FNP

## 2019-11-28 NOTE — Telephone Encounter (Signed)
New Message  Patient's daughter is returning call for results. Transferred to [employer]

## 2020-01-12 DIAGNOSIS — R69 Illness, unspecified: Secondary | ICD-10-CM | POA: Diagnosis not present

## 2020-01-14 DIAGNOSIS — R69 Illness, unspecified: Secondary | ICD-10-CM | POA: Diagnosis not present

## 2020-01-15 ENCOUNTER — Ambulatory Visit (INDEPENDENT_AMBULATORY_CARE_PROVIDER_SITE_OTHER): Payer: Medicare HMO | Admitting: Family

## 2020-01-15 ENCOUNTER — Encounter: Payer: Self-pay | Admitting: Family

## 2020-01-15 ENCOUNTER — Other Ambulatory Visit: Payer: Self-pay

## 2020-01-15 VITALS — BP 165/82 | HR 82 | Temp 97.6°F | Ht 67.0 in | Wt 164.4 lb

## 2020-01-15 DIAGNOSIS — I701 Atherosclerosis of renal artery: Secondary | ICD-10-CM

## 2020-01-15 DIAGNOSIS — E1169 Type 2 diabetes mellitus with other specified complication: Secondary | ICD-10-CM | POA: Diagnosis not present

## 2020-01-15 DIAGNOSIS — Z23 Encounter for immunization: Secondary | ICD-10-CM | POA: Diagnosis not present

## 2020-01-15 DIAGNOSIS — E1159 Type 2 diabetes mellitus with other circulatory complications: Secondary | ICD-10-CM | POA: Diagnosis not present

## 2020-01-15 DIAGNOSIS — I10 Essential (primary) hypertension: Secondary | ICD-10-CM | POA: Diagnosis not present

## 2020-01-15 DIAGNOSIS — J449 Chronic obstructive pulmonary disease, unspecified: Secondary | ICD-10-CM

## 2020-01-15 DIAGNOSIS — E785 Hyperlipidemia, unspecified: Secondary | ICD-10-CM | POA: Diagnosis not present

## 2020-01-15 LAB — BAYER DCA HB A1C WAIVED: HB A1C (BAYER DCA - WAIVED): 7.9 % — ABNORMAL HIGH (ref <7.0)

## 2020-01-15 NOTE — Patient Instructions (Signed)

## 2020-01-15 NOTE — Progress Notes (Signed)
Subjective:    Patient ID: Amanda Booker, female    DOB: 11-16-1950, 69 y.o.   MRN: [id number]  Chief Complaint  Patient presents with  . Diabetes   PT presents to the office today for chronic follow up. Pt presents to the office today to establish care. 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 She is followed by Cardiologists every 6 months.  Diabetes She presents for her follow-up diabetic visit. She has type 2 diabetes mellitus. Her disease course has been stable. There are no hypoglycemic associated symptoms. Pertinent negatives for diabetes include no blurred vision and no foot paresthesias. Symptoms are stable. Diabetic complications include a CVA and nephropathy. Risk factors for coronary artery disease include dyslipidemia, diabetes mellitus, hypertension, sedentary lifestyle and post-menopausal. She is following a generally unhealthy diet. Her overall blood glucose range is 130-140 mg/dl. An ACE inhibitor/angiotensin II receptor blocker is being taken. Eye exam is not current (Has appt).  Hypertension This is a chronic problem. The current episode started more than 1 year ago. The problem has been waxing and waning since onset. The problem is uncontrolled. Associated symptoms include malaise/fatigue. Pertinent negatives include no blurred vision, peripheral edema or shortness of breath. Risk factors for coronary artery disease include sedentary lifestyle, diabetes mellitus and dyslipidemia. The current treatment provides moderate improvement. Hypertensive end-organ damage includes CVA.  Hyperlipidemia This is a chronic problem. The current episode started more than 1 year ago. The problem is controlled. Pertinent negatives include no shortness of breath. Current antihyperlipidemic treatment includes statins. The current treatment provides moderate improvement of lipids. Risk factors for coronary artery disease include dyslipidemia, diabetes mellitus, a sedentary lifestyle and  post-menopausal.  Nicotine Dependence Presents for follow-up visit. Her urge triggers include company of smokers. The symptoms have been stable. She smokes < 1/2 a pack of cigarettes per day.   COPD Continues to smoke 1/2 pack a day. Has Advair daily. Breathing is stable.    Review of Systems  Constitutional: Positive for malaise/fatigue.  Eyes: Negative for blurred vision.  Respiratory: Negative for shortness of breath.   All other systems reviewed and are negative.      Objective:   Physical Exam Vitals reviewed.  Constitutional:      General: She is not in acute distress.    Appearance: She is well-developed.  HENT:     Head: Normocephalic and atraumatic.     Right Ear: Tympanic membrane normal.     Left Ear: Tympanic membrane normal.  Eyes:     Pupils: Pupils are equal, round, and reactive to light.  Neck:     Thyroid: No thyromegaly.  Cardiovascular:     Rate and Rhythm: Normal rate and regular rhythm.     Heart sounds: Murmur heard.   Pulmonary:     Effort: Pulmonary effort is normal. No respiratory distress.     Breath sounds: Normal breath sounds. No wheezing.  Abdominal:     General: Bowel sounds are normal. There is no distension.     Palpations: Abdomen is soft.     Tenderness: There is no abdominal tenderness.  Musculoskeletal:        General: No tenderness. Normal range of motion.     Cervical back: Normal range of motion and neck supple.  Skin:    General: Skin is warm and dry.  Neurological:     Mental Status: She is alert and oriented to person, place, and time.     Cranial Nerves:  No cranial nerve deficit.     Deep Tendon Reflexes: Reflexes are normal and symmetric.  Psychiatric:        Behavior: Behavior normal.        Thought Content: Thought content normal.        Judgment: Judgment normal.        BP (!) 182/89   Pulse 78   Temp 97.6 F (36.4 C) (Temporal)   Ht _0  (1.702 m)   Wt 164 lb 6.4 oz (74.6 kg)   SpO2 96%   BMI 25.75  kg/m   Assessment & Plan:  Amanda Booker comes in today with chief complaint of Diabetes   Diagnosis and orders addressed:  1. DM type 2 with diabetic dyslipidemia ([place]) - Bayer DCA Hb A1c Waived - CMP14+EGFR - CBC with Differential/Platelet  2. Atherosclerosis of renal artery (HCC) - CMP14+EGFR - CBC with Differential/Platelet  3. Hypertension associated with diabetes ([place]) - CMP14+EGFR - CBC with Differential/Platelet  4. Renal artery stenosis (HCC) - CMP14+EGFR - CBC with Differential/Platelet  5. COPD (chronic obstructive pulmonary disease) with chronic bronchitis (HCC) - CMP14+EGFR - CBC with Differential/Platelet  6. Hyperlipidemia associated with type 2 diabetes mellitus ([place]) - CMP14+EGFR - CBC with Differential/Platelet   Labs pending Health Maintenance reviewed Diet and exercise encouraged  Follow up plan: 4 months   Evelina Dun, FNP

## 2020-01-16 LAB — CMP14+EGFR
ALT: 12 IU/L (ref 0–32)
AST: 16 IU/L (ref 0–40)
Albumin/Globulin Ratio: 2.1 (ref 1.2–2.2)
Albumin: 4.9 g/dL — ABNORMAL HIGH (ref 3.8–4.8)
Alkaline Phosphatase: 75 IU/L (ref 44–121)
BUN/Creatinine Ratio: 25 (ref 12–28)
BUN: 22 mg/dL (ref 8–27)
Bilirubin Total: 0.2 mg/dL (ref 0.0–1.2)
CO2: 26 mmol/L (ref 20–29)
Calcium: 10.7 mg/dL — ABNORMAL HIGH (ref 8.7–10.3)
Chloride: 100 mmol/L (ref 96–106)
Creatinine, Ser: 0.87 mg/dL (ref 0.57–1.00)
GFR calc Af Amer: 80 mL/min/{1.73_m2} (ref >59)
GFR calc non Af Amer: 69 mL/min/{1.73_m2} (ref >59)
Globulin, Total: 2.3 g/dL (ref 1.5–4.5)
Glucose: 146 mg/dL — ABNORMAL HIGH (ref 65–99)
Potassium: 4.4 mmol/L (ref 3.5–5.2)
Sodium: 142 mmol/L (ref 134–144)
Total Protein: 7.2 g/dL (ref 6.0–8.5)

## 2020-01-16 LAB — CBC WITH DIFFERENTIAL/PLATELET
Basophils Absolute: 0.1 10*3/uL (ref 0.0–0.2)
Basos: 1 %
EOS (ABSOLUTE): 0.1 10*3/uL (ref 0.0–0.4)
Eos: 2 %
Hematocrit: 47.7 % — ABNORMAL HIGH (ref 34.0–46.6)
Hemoglobin: 15.9 g/dL (ref 11.1–15.9)
Immature Grans (Abs): 0 10*3/uL (ref 0.0–0.1)
Immature Granulocytes: 0 %
Lymphocytes Absolute: 1.9 10*3/uL (ref 0.7–3.1)
Lymphs: 26 %
MCH: 30.1 pg (ref 26.6–33.0)
MCHC: 33.3 g/dL (ref 31.5–35.7)
MCV: 90 fL (ref 79–97)
Monocytes Absolute: 0.4 10*3/uL (ref 0.1–0.9)
Monocytes: 5 %
Neutrophils Absolute: 5 10*3/uL (ref 1.4–7.0)
Neutrophils: 66 %
Platelets: 359 10*3/uL (ref 150–450)
RBC: 5.29 x10E6/uL — ABNORMAL HIGH (ref 3.77–5.28)
RDW: 12.7 % (ref 11.7–15.4)
WBC: 7.6 10*3/uL (ref 3.4–10.8)

## 2020-02-03 DIAGNOSIS — Z0100 Encounter for examination of eyes and vision without abnormal findings: Secondary | ICD-10-CM | POA: Diagnosis not present

## 2020-02-03 DIAGNOSIS — H52229 Regular astigmatism, unspecified eye: Secondary | ICD-10-CM | POA: Diagnosis not present

## 2020-02-10 DIAGNOSIS — R011 Cardiac murmur, unspecified: Secondary | ICD-10-CM | POA: Insufficient documentation

## 2020-02-10 NOTE — Progress Notes (Deleted)
Cardiology Office Note   Date:  02/10/2020   ID:  [name], [name] March 30, 1951, MRN [id number]  PCP:  Amanda Spencer, FNP  Cardiologist:   No primary care provider on file.   No chief complaint on file.     History of Present Illness: Amanda Booker is a 69 y.o. female who is referred by Amanda Spencer, FNP for evaluation of multiple cardiovascular risk factors and PVD and a heart murmur.   She had bilateral less than 60% RAS on Doppler in 2015.  Her BP was not controlled when I saw her and I started spironolactone. Follow up potassium and creat tolerated this addition.  ***    ***She otherwise has not had significant cardiovascular problems.  She lives with her daughter who is a Engineer, civil (consulting).  She does some light housekeeping and grocery shopping and yard sales.  The patient denies any new symptoms such as chest discomfort, neck or arm discomfort. There has been no new shortness of breath, PND or orthopnea. There have been no reported palpitations, presyncope or syncope.  She does have COPD and takes bronchodilators and was hospitalized in the distant past for COPD flare but she actually does well she says with her breathing on her medicines.  She was noted recently to have a heart murmur and has never been told about this before.  Past Medical History:  Diagnosis Date  . Cerebral aneurysm rupture (HCC)   . Cerebral ventricular shunt fitting or adjustment   . Chronic kidney disease   . Coagulopathy (HCC)   . COPD (chronic obstructive pulmonary disease) (HCC)   . Diabetes mellitus   . Hypertension   . Hypokalemia   . Kidney stone   . Renal artery stenosis [facility])     Past Surgical History:  Procedure Laterality Date  . ABDOMINAL HYSTERECTOMY  2007  . cerebral shunt       Current Outpatient Medications  Medication Sig Dispense Refill  . albuterol (PROVENTIL) (5 MG/ML) 0.5% nebulizer solution Take 0.5 mLs (2.5 mg total) by nebulization every 6 (six) hours as needed  for wheezing or shortness of breath. 20 mL 12  . albuterol (VENTOLIN HFA) 108 (90 Base) MCG/ACT inhaler Inhale 2 puffs into the lungs every 6 (six) hours as needed for wheezing or shortness of breath. 18 g 11  . amLODipine (NORVASC) 10 MG tablet Take 1 tablet (10 mg total) by mouth every morning. 90 tablet 1  . aspirin 81 MG tablet Take 81 mg by mouth every morning.     Marland Kitchen atorvastatin (LIPITOR) 40 MG tablet Take 1 tablet (40 mg total) by mouth every morning. 90 tablet 1  . cloNIDine (CATAPRES) 0.3 MG tablet Take 1 tablet (0.3 mg total) by mouth 2 (two) times daily. 180 tablet 1  . fluticasone-salmeterol (ADVAIR HFA) 45-21 MCG/ACT inhaler Inhale 2 puffs into the lungs 2 (two) times daily. 1 Inhaler 12  . glipiZIDE (GLUCOTROL) 10 MG tablet Take 1 tablet (10 mg total) by mouth 2 (two) times daily before a meal. 180 tablet 1  . glucose blood test strip Check BS daily Dx E11.69 100 each 3  . hydrochlorothiazide (HYDRODIURIL) 25 MG tablet Take 1 tablet (25 mg total) by mouth every morning. 90 tablet 1  . metFORMIN (GLUCOPHAGE) 1000 MG tablet Take 1 tablet (1,000 mg total) by mouth 2 (two) times daily with a meal. 180 tablet 3  . metoprolol succinate (TOPROL-XL) 50 MG 24 hr tablet Take 2 tablets (100 mg  total) by mouth daily. Take with or immediately following a meal. 180 tablet 1  . quinapril (ACCUPRIL) 40 MG tablet TAKE 1 TABLET BY MOUTH ONCE DAILY IN THE MORNING 90 tablet 1  . spironolactone (ALDACTONE) 25 MG tablet Take 1 tablet (25 mg total) by mouth daily. 90 tablet 3   No current facility-administered medications for this visit.    Allergies:   Patient has no known allergies.    ROS:  Please see the history of present illness.   Otherwise, review of systems are positive for ***.   All other systems are reviewed and negative.    PHYSICAL EXAM: VS:  There were no vitals taken for this visit. , BMI There is no height or weight on file to calculate BMI. GENERAL:  Well appearing NECK:  No  jugular venous distention, waveform within normal limits, carotid upstroke brisk and symmetric, no bruits, no thyromegaly LUNGS:  Clear to auscultation bilaterally CHEST:  Unremarkable HEART:  PMI not displaced or sustained,S1 and S2 within normal limits, no S3, no S4, no clicks, no rubs, *** murmurs ABD:  Flat, positive bowel sounds normal in frequency in pitch, no bruits, no rebound, no guarding, no midline pulsatile mass, no hepatomegaly, no splenomegaly EXT:  2 plus pulses throughout, no edema, no cyanosis no clubbing    ***GENERAL:  Well appearing HEENT:  Pupils equal round and reactive, fundi not visualized, oral mucosa unremarkable NECK:  No jugular venous distention, waveform within normal limits, carotid upstroke brisk and symmetric, no bruits, no thyromegaly LYMPHATICS:  No cervical, inguinal adenopathy LUNGS:  Clear to auscultation bilaterally BACK:  No CVA tenderness CHEST:  Unremarkable HEART:  PMI not displaced or sustained,S1 and S2 within normal limits, no S3, no S4, no clicks, no rubs, 3 out of 6 systolic murmur heard at the apex and radiating to the axilla, slightly honking, mid peaking, radiating up to the carotids, no change with Valsalva, no diastolic murmurs ABD:  Flat, positive bowel sounds normal in frequency in pitch, no bruits, no rebound, no guarding, no midline pulsatile mass, no hepatomegaly, no splenomegaly EXT:  2 plus pulses throughout, no edema, no cyanosis no clubbing SKIN:  No rashes no nodules NEURO:  Cranial nerves II through XII grossly intact, motor grossly intact throughout PSYCH:  Cognitively intact, oriented to person place and time    EKG:  EKG is *** ordered today. The ekg ordered today demonstrates sinus rhythm, rate ***, axis within normal limits, intervals within normal limits, none specific lateral T wave changes.   Recent Labs: 01/15/2020: ALT 12; BUN 22; Creatinine, Ser 0.87; Hemoglobin 15.9; Platelets 359; Potassium 4.4; Sodium 142     Lipid Panel    Component Value Date/Time   CHOL 149 07/02/2019 1545   TRIG 187 (H) 07/02/2019 1545   HDL 48 07/02/2019 1545   CHOLHDL 3.1 07/02/2019 1545   LDLCALC 70 07/02/2019 1545      Wt Readings from Last 3 Encounters:  01/15/20 164 lb 6.4 oz (74.6 kg)  11/05/19 163 lb (73.9 kg)  10/03/19 163 lb 12.8 oz (74.3 kg)      Other studies Reviewed: Additional studies/ records that were reviewed today include: ***. Review of the above records demonstrates:  Please see elsewhere in the note.     ASSESSMENT AND PLAN:  HTN:   Her blood pressure is *** not well controlled.  I am going to add spironolactone.  I think because of low potassiums I will leave her on her potassium supplement  as well as ACE inhibitor.  She will remain on the other meds.  I will check a basic metabolic profile in 2 weeks and then again 4 weeks after that.  DM: Her A1c was *** greater than 9 and is down to 7.  I will defer to Jannifer Rodney A, FNP  DYSLIPIDEMIA:   LDL was ***78. HDL 48.  She will continue the meds as listed.  RAS:  ***  might reassess this in the future although this was not assessed to be the cause of her difficult to control hypertension.  MURMUR:   *** I suspect that this is aortic stenosis although it radiates to the axilla.  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     TOBACCO ABUSE:  *** She has no intention of quitting smoking.  RISK REDUCTION: *** I would like to assess with POET (Plain Old Exercise Treadmill) in the future given her multiple risk factors but we would have to wait till her blood pressure is better controlled.  COVID EDUCATION: She has been vaccinated.  Current medicines are reviewed at length with the patient today.  The patient does not have concerns regarding medicines.  The following changes have been made: ***  Labs/ tests ordered today include: ***  No orders of the defined types were placed in this encounter.    Disposition:   FU with me in  *** months.     Signed, Rollene Rotunda, MD  02/10/2020 7:20 PM    [facility]

## 2020-02-11 ENCOUNTER — Ambulatory Visit: Payer: Medicare HMO | Admitting: Cardiology

## 2020-02-11 DIAGNOSIS — R011 Cardiac murmur, unspecified: Secondary | ICD-10-CM

## 2020-02-11 DIAGNOSIS — I10 Essential (primary) hypertension: Secondary | ICD-10-CM

## 2020-02-11 DIAGNOSIS — E118 Type 2 diabetes mellitus with unspecified complications: Secondary | ICD-10-CM

## 2020-03-06 ENCOUNTER — Ambulatory Visit: Payer: Medicare HMO | Attending: Internal Medicine

## 2020-03-06 DIAGNOSIS — Z23 Encounter for immunization: Secondary | ICD-10-CM

## 2020-03-06 NOTE — Progress Notes (Signed)
   Covid-19 Vaccination Clinic  Name:  Amanda Booker    MRN: [id number] DOB: Jul 08, 1950  03/06/2020  [name] was observed post Covid-19 immunization for 15 minutes without incident. She was provided with Vaccine Information Sheet and instruction to access the V-Safe system.   [name] was instructed to call 911 with any severe reactions post vaccine: Marland Kitchen Difficulty breathing  . Swelling of face and throat  . A fast heartbeat  . A bad rash all over body  . Dizziness and weakness   Immunizations Administered    Name Date Dose VIS Date Route   Pfizer COVID-19 Vaccine 03/06/2020  3:10 PM 0.3 mL 02/11/2020 Intramuscular   Manufacturer: [employer], [employer]   Lot: [id number]   NDC: [address]-5

## 2020-04-22 ENCOUNTER — Telehealth: Payer: Self-pay | Admitting: *Deleted

## 2020-04-22 NOTE — Chronic Care Management (AMB) (Signed)
  Chronic Care Management   Note  04/22/2020 Name: Amanda Booker MRN: [id number] DOB: 09/05/1950  Amanda Booker is a 69 y.o. year old female who is a primary care patient of Sharion Balloon, FNP. I reached out to [facility] by phone today in response to a referral sent by Ms. Gilmore Safi's health plan.     Ms. Fales was given information about Chronic Care Management services today including:  1. CCM service includes personalized support from designated clinical staff supervised by her physician, including individualized plan of care and coordination with other care providers 2. 24/7 contact phone numbers for assistance for urgent and routine care needs. 3. Service will only be billed when office clinical staff spend 20 minutes or more in a month to coordinate care. 4. Only one practitioner may furnish and bill the service in a calendar month. 5. The patient may stop CCM services at any time (effective at the end of the month) by phone call to the office staff. 6. The patient will be responsible for cost sharing (co-pay) of up to 20% of the service fee (after annual deductible is met).  Patient daughter [name] DPR on file verbally agreed to assistance and services provided by embedded care coordination/care management team today.  Follow up plan: Telephone appointment with care management team member scheduled for:05/20/2020  [facility]

## 2020-04-27 ENCOUNTER — Other Ambulatory Visit: Payer: Self-pay | Admitting: Family

## 2020-04-27 DIAGNOSIS — I350 Nonrheumatic aortic (valve) stenosis: Secondary | ICD-10-CM | POA: Insufficient documentation

## 2020-04-27 DIAGNOSIS — E1159 Type 2 diabetes mellitus with other circulatory complications: Secondary | ICD-10-CM

## 2020-04-27 NOTE — Progress Notes (Deleted)
Cardiology Office Note   Date:  04/27/2020   ID:  Lakena, Sparlin Mar 07, 1951, MRN [id number]  PCP:  Sharion Balloon, FNP  Cardiologist:   Minus Breeding, MD   No chief complaint on file.     History of Present Illness: Amanda Booker is a 70 y.o. female who is referred by Sharion Balloon, FNP for evaluation of multiple cardiovascular risk factors and PVD and a heart murmur.   She had bilateral less than 60% RAS on Doppler in 2015.   At the last visit she was found to have mild to moderate AS.  She was hypertensive and I added spironolactone. ***   *** She otherwise has not had significant cardiovascular problems.  She lives with her daughter who is a Marine scientist.  She does some light housekeeping and grocery shopping and yard sales.  The patient denies any new symptoms such as chest discomfort, neck or arm discomfort. There has been no new shortness of breath, PND or orthopnea. There have been no reported palpitations, presyncope or syncope.  She does have COPD and takes bronchodilators and was hospitalized in the distant past for COPD flare but she actually does well she says with her breathing on her medicines.  She was noted recently to have a heart murmur and has never been told about this before.  Past Medical History:  Diagnosis Date  . Cerebral aneurysm rupture ([place])   . Cerebral ventricular shunt fitting or adjustment   . Chronic kidney disease   . Coagulopathy ([place])   . COPD (chronic obstructive pulmonary disease) ([employer])   . Diabetes mellitus   . Hypertension   . Hypokalemia   . Kidney stone   . Renal artery stenosis [facility])     Past Surgical History:  Procedure Laterality Date  . ABDOMINAL HYSTERECTOMY  2007  . cerebral shunt       Current Outpatient Medications  Medication Sig Dispense Refill  . albuterol (PROVENTIL) (5 MG/ML) 0.5% nebulizer solution Take 0.5 mLs (2.5 mg total) by nebulization every 6 (six) hours as needed for wheezing or shortness of  breath. 20 mL 12  . albuterol (VENTOLIN HFA) 108 (90 Base) MCG/ACT inhaler Inhale 2 puffs into the lungs every 6 (six) hours as needed for wheezing or shortness of breath. 18 g 11  . amLODipine (NORVASC) 10 MG tablet Take 1 tablet (10 mg total) by mouth every morning. 90 tablet 1  . aspirin 81 MG tablet Take 81 mg by mouth every morning.     Marland Kitchen atorvastatin (LIPITOR) 40 MG tablet Take 1 tablet (40 mg total) by mouth every morning. 90 tablet 1  . cloNIDine (CATAPRES) 0.3 MG tablet Take 1 tablet (0.3 mg total) by mouth 2 (two) times daily. 180 tablet 1  . fluticasone-salmeterol (ADVAIR HFA) 45-21 MCG/ACT inhaler Inhale 2 puffs into the lungs 2 (two) times daily. 1 Inhaler 12  . glipiZIDE (GLUCOTROL) 10 MG tablet Take 1 tablet (10 mg total) by mouth 2 (two) times daily before a meal. 180 tablet 1  . glucose blood test strip Check BS daily Dx E11.69 100 each 3  . hydrochlorothiazide (HYDRODIURIL) 25 MG tablet Take 1 tablet (25 mg total) by mouth every morning. 90 tablet 1  . metFORMIN (GLUCOPHAGE) 1000 MG tablet Take 1 tablet (1,000 mg total) by mouth 2 (two) times daily with a meal. 180 tablet 3  . metoprolol succinate (TOPROL-XL) 50 MG 24 hr tablet Take 2 tablets (100 mg total) by mouth  daily. Take with or immediately following a meal. 180 tablet 1  . quinapril (ACCUPRIL) 40 MG tablet TAKE 1 TABLET BY MOUTH ONCE DAILY IN THE MORNING 90 tablet 1  . spironolactone (ALDACTONE) 25 MG tablet Take 1 tablet (25 mg total) by mouth daily. 90 tablet 3   No current facility-administered medications for this visit.    Allergies:   Patient has no known allergies.     ROS:  Please see the history of present illness.   Otherwise, review of systems are positive for ***.   All other systems are reviewed and negative.    PHYSICAL EXAM: VS:  There were no vitals taken for this visit. , BMI There is no height or weight on file to calculate BMI. GENERAL:  Well appearing NECK:  No jugular venous distention,  waveform within normal limits, carotid upstroke brisk and symmetric, no bruits, no thyromegaly LUNGS:  Clear to auscultation bilaterally CHEST:  Unremarkable HEART:  PMI not displaced or sustained,S1 and S2 within normal limits, no S3, no S4, no clicks, no rubs, *** murmurs ABD:  Flat, positive bowel sounds normal in frequency in pitch, no bruits, no rebound, no guarding, no midline pulsatile mass, no hepatomegaly, no splenomegaly EXT:  2 plus pulses throughout, no edema, no cyanosis no clubbing    *** GENERAL:  Well appearing HEENT:  Pupils equal round and reactive, fundi not visualized, oral mucosa unremarkable NECK:  No jugular venous distention, waveform within normal limits, carotid upstroke brisk and symmetric, no bruits, no thyromegaly LYMPHATICS:  No cervical, inguinal adenopathy LUNGS:  Clear to auscultation bilaterally BACK:  No CVA tenderness CHEST:  Unremarkable HEART:  PMI not displaced or sustained,S1 and S2 within normal limits, no S3, no S4, no clicks, no rubs, 3 out of 6 systolic murmur heard at the apex and radiating to the axilla, slightly honking, mid peaking, radiating up to the carotids, no change with Valsalva, no diastolic murmurs ABD:  Flat, positive bowel sounds normal in frequency in pitch, no bruits, no rebound, no guarding, no midline pulsatile mass, no hepatomegaly, no splenomegaly EXT:  2 plus pulses throughout, no edema, no cyanosis no clubbing SKIN:  No rashes no nodules NEURO:  Cranial nerves II through XII grossly intact, motor grossly intact throughout PSYCH:  Cognitively intact, oriented to person place and time    EKG:  EKG is *** ordered today. The ekg ordered today demonstrates sinus rhythm, rate 89, axis within normal limits, intervals within normal limits, none specific lateral T wave changes.   Recent Labs: 01/15/2020: ALT 12; BUN 22; Creatinine, Ser 0.87; Hemoglobin 15.9; Platelets 359; Potassium 4.4; Sodium 142    Lipid Panel    Component  Value Date/Time   CHOL 149 07/02/2019 1545   TRIG 187 (H) 07/02/2019 1545   HDL 48 07/02/2019 1545   CHOLHDL 3.1 07/02/2019 1545   LDLCALC 70 07/02/2019 1545      Wt Readings from Last 3 Encounters:  01/15/20 164 lb 6.4 oz (74.6 kg)  11/05/19 163 lb (73.9 kg)  10/03/19 163 lb 12.8 oz (74.3 kg)      Other studies Reviewed: Additional studies/ records that were reviewed today include: *** Review of the above records demonstrates:  Please see elsewhere in the note.     ASSESSMENT AND PLAN:  HTN:   Her blood pressure is *** not well controlled.  I am going to add spironolactone.  I think because of low potassiums I will leave her on her potassium supplement as  well as ACE inhibitor.  She will remain on the other meds.  I will check a basic metabolic profile in 2 weeks and then again 4 weeks after that.  DM: Her A1c was *** greater than 9 and is down to 7.  I will defer to Jannifer Rodney A, FNP  DYSLIPIDEMIA:   LDL 78. HDL 48.  She will continue the meds as listed.  RAS:  *** I might reassess this in the future although this was not assessed to be the cause of her difficult to control hypertension.  AORTIC STENOSIS: ***  I suspect that this is aortic stenosis although it radiates to the axilla.  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     TOBACCO ABUSE:  ***  She has no intention of quitting smoking.  RISK REDUCTION: I would like to assess with POET (Plain Old Exercise Treadmill) in the future given her multiple risk factors but we would have to wait till her blood pressure is better controlled.  COVID EDUCATION: She has been vaccinated.  Current medicines are reviewed at length with the patient today.  The patient does not have concerns regarding medicines.  The following changes have been made:  ***.  Labs/ tests ordered today include: ***  No orders of the defined types were placed in this encounter.    Disposition:   FU with me in *** months.     Signed, Rollene Rotunda, MD  04/27/2020 9:50 PM    [facility]

## 2020-04-28 ENCOUNTER — Ambulatory Visit: Payer: Medicare HMO | Admitting: Cardiology

## 2020-04-28 DIAGNOSIS — E118 Type 2 diabetes mellitus with unspecified complications: Secondary | ICD-10-CM

## 2020-04-28 DIAGNOSIS — I350 Nonrheumatic aortic (valve) stenosis: Secondary | ICD-10-CM

## 2020-04-28 DIAGNOSIS — I10 Essential (primary) hypertension: Secondary | ICD-10-CM

## 2020-04-30 ENCOUNTER — Telehealth: Payer: Self-pay

## 2020-04-30 MED ORDER — GLUCOSE BLOOD VI STRP: ORAL_STRIP | 3 refills | Status: DC

## 2020-04-30 NOTE — Telephone Encounter (Signed)
LMOVM refill sent to pharmacy 

## 2020-04-30 NOTE — Telephone Encounter (Signed)
  Prescription Request  04/30/2020  What is the name of the medication or equipment? Glucose test strips  Have you contacted your pharmacy to request a refill? (if applicable) yes  Which pharmacy would you like this sent to? [facility], [place]  Pt says all of her refills were sent in except for her test strips. Needs refills sent to [facility] in [place] ASAP.

## 2020-05-17 ENCOUNTER — Ambulatory Visit: Payer: Medicare HMO | Admitting: Family

## 2020-05-18 NOTE — Progress Notes (Signed)
Cardiology Office Note   Date:  05/19/2020   ID:  Meylin, Stenzel 01/26/1951, MRN [id number]  PCP:  Junie Spencer, FNP  Cardiologist:   Rollene Rotunda, MD   Chief Complaint  Patient presents with  . Aortic Stenosis      History of Present Illness: Amanda Booker is a 70 y.o. female who is referred by Junie Spencer, FNP for evaluation of multiple cardiovascular risk factors and PVD and a heart murmur.   She had bilateral less than 60% RAS on Doppler in 2015.   At the last visit she was found to have mild to moderate AS.  She was hypertensive and I added spironolactone. Since I last saw her she has done well.  The patient denies any new symptoms such as chest discomfort, neck or arm discomfort. There has been no new shortness of breath, PND or orthopnea. There have been no reported palpitations, presyncope or syncope.  She does her household activities and has no symptoms related to this.     Past Medical History:  Diagnosis Date  . Cerebral aneurysm rupture ([facility])   . Cerebral ventricular shunt fitting or adjustment   . Chronic kidney disease   . Coagulopathy ([facility])   . COPD (chronic obstructive pulmonary disease) ([facility])   . Diabetes mellitus   . Hypertension   . Hypokalemia   . Kidney stone   . Renal artery stenosis [facility])     Past Surgical History:  Procedure Laterality Date  . ABDOMINAL HYSTERECTOMY  2007  . cerebral shunt       Current Outpatient Medications  Medication Sig Dispense Refill  . albuterol (PROVENTIL) (5 MG/ML) 0.5% nebulizer solution Take 0.5 mLs (2.5 mg total) by nebulization every 6 (six) hours as needed for wheezing or shortness of breath. 20 mL 12  . albuterol (VENTOLIN HFA) 108 (90 Base) MCG/ACT inhaler Inhale 2 puffs into the lungs every 6 (six) hours as needed for wheezing or shortness of breath. 18 g 11  . amLODipine (NORVASC) 10 MG tablet TAKE 1 TABLET BY MOUTH ONCE DAILY IN THE MORNING 90 tablet 0  . aspirin 81 MG tablet Take 81  mg by mouth every morning.     Marland Kitchen atorvastatin (LIPITOR) 40 MG tablet Take 1 tablet (40 mg total) by mouth every morning. 90 tablet 1  . cloNIDine (CATAPRES) 0.3 MG tablet Take 1 tablet (0.3 mg total) by mouth 2 (two) times daily. 180 tablet 1  . fluticasone-salmeterol (ADVAIR HFA) 45-21 MCG/ACT inhaler Inhale 2 puffs into the lungs 2 (two) times daily. 1 Inhaler 12  . glipiZIDE (GLUCOTROL) 10 MG tablet Take 1 tablet (10 mg total) by mouth 2 (two) times daily before a meal. 180 tablet 1  . hydrochlorothiazide (HYDRODIURIL) 25 MG tablet TAKE 1 TABLET BY MOUTH ONCE DAILY IN THE MORNING 90 tablet 0  . metFORMIN (GLUCOPHAGE) 1000 MG tablet Take 1 tablet (1,000 mg total) by mouth 2 (two) times daily with a meal. 180 tablet 3  . metoprolol succinate (TOPROL-XL) 50 MG 24 hr tablet Take 2 tablets (100 mg total) by mouth daily. Take with or immediately following a meal. 180 tablet 1  . quinapril (ACCUPRIL) 40 MG tablet TAKE 1 TABLET BY MOUTH ONCE DAILY IN THE MORNING 90 tablet 1  . spironolactone (ALDACTONE) 25 MG tablet Take 1 tablet (25 mg total) by mouth daily. 90 tablet 3  . glucose blood test strip Check BS daily Dx E11.69 100 each 3  No current facility-administered medications for this visit.    Allergies:   Patient has no known allergies.     ROS:  Please see the history of present illness.   Otherwise, review of systems are positive for none.   All other systems are reviewed and negative.    PHYSICAL EXAM: VS:  BP 140/76   Pulse 68   Ht 5\' 7"  (1.702 m)   Wt 167 lb (75.8 kg)   BMI 26.16 kg/m  , BMI Body mass index is 26.16 kg/m. GENERAL:  Well appearing NECK:  No jugular venous distention, waveform within normal limits, carotid upstroke brisk and symmetric, no bruits, no thyromegaly LUNGS:  Clear to auscultation bilaterally CHEST:  Unremarkable HEART:  PMI not displaced or sustained,S1 and S2 within normal limits, no S3, no S4, no clicks, no rubs, 3 of 6 apical systolic murmur  radiating slightly at the aortic outflow tract, no diastolic murmurs ABD:  Flat, positive bowel sounds normal in frequency in pitch, no bruits, no rebound, no guarding, no midline pulsatile mass, no hepatomegaly, no splenomegaly EXT:  2 plus pulses throughout, no edema, no cyanosis no clubbing  EKG:  EKG is not ordered today.   Recent Labs: 01/15/2020: ALT 12; BUN 22; Creatinine, Ser 0.87; Hemoglobin 15.9; Platelets 359; Potassium 4.4; Sodium 142    Lipid Panel    Component Value Date/Time   CHOL 149 07/02/2019 1545   TRIG 187 (H) 07/02/2019 1545   HDL 48 07/02/2019 1545   CHOLHDL 3.1 07/02/2019 1545   LDLCALC 70 07/02/2019 1545      Wt Readings from Last 3 Encounters:  05/19/20 167 lb (75.8 kg)  01/15/20 164 lb 6.4 oz (74.6 kg)  11/05/19 163 lb (73.9 kg)      Other studies Reviewed: Additional studies/ records that were reviewed today include: Labs Review of the above records demonstrates:  Please see elsewhere in the note.     ASSESSMENT AND PLAN:  HTN:   Her blood pressure is much better controlled.  Her daughter is a 11/07/19 who takes her blood pressure and says it is much better than it has been.  Her chemistry after starting the spironolactone was okay.  Asked the daughter to make sure she gets another one at her next appointment.  This should be upcoming.   DM: Her A1c was 7.9.  She is having this followed by Engineer, civil (consulting), FNP   DYSLIPIDEMIA:   LDL 70. HDL 48.  She will continue the meds as listed.  RAS:    Her blood pressure is controlled.  No further imaging is indicated.   AORTIC STENOSIS: This was moderate and I will follow this clinically probably with a repeat echocardiogram in 1 year.   TOBACCO ABUSE:    We talked about this again today and she has no plans on quitting.    COVID EDUCATION: She has been vaccinated.  Current medicines are reviewed at length with the patient today.  The patient does not have concerns regarding medicines.  The following  changes have been made:  None.  Labs/ tests ordered today include: None  No orders of the defined types were placed in this encounter.    Disposition:   FU with me in 12 months.     Signed, Junie Spencer, MD  05/19/2020 11:31 AM    [facility]

## 2020-05-19 ENCOUNTER — Telehealth: Payer: Self-pay

## 2020-05-19 ENCOUNTER — Ambulatory Visit (INDEPENDENT_AMBULATORY_CARE_PROVIDER_SITE_OTHER): Payer: Medicare HMO | Admitting: Cardiology

## 2020-05-19 ENCOUNTER — Other Ambulatory Visit: Payer: Self-pay

## 2020-05-19 ENCOUNTER — Encounter: Payer: Self-pay | Admitting: Cardiology

## 2020-05-19 VITALS — BP 140/76 | HR 68 | Ht 67.0 in | Wt 167.0 lb

## 2020-05-19 DIAGNOSIS — I152 Hypertension secondary to endocrine disorders: Secondary | ICD-10-CM

## 2020-05-19 DIAGNOSIS — E118 Type 2 diabetes mellitus with unspecified complications: Secondary | ICD-10-CM | POA: Diagnosis not present

## 2020-05-19 DIAGNOSIS — E1169 Type 2 diabetes mellitus with other specified complication: Secondary | ICD-10-CM

## 2020-05-19 DIAGNOSIS — E785 Hyperlipidemia, unspecified: Secondary | ICD-10-CM | POA: Diagnosis not present

## 2020-05-19 DIAGNOSIS — I350 Nonrheumatic aortic (valve) stenosis: Secondary | ICD-10-CM

## 2020-05-19 DIAGNOSIS — I701 Atherosclerosis of renal artery: Secondary | ICD-10-CM

## 2020-05-19 NOTE — Patient Instructions (Signed)
Medication Instructions:  The current medical regimen is effective;  continue present plan and medications.  *If you need a refill on your cardiac medications before your next appointment, please call your pharmacy*  Follow-Up: At [facility], you and your health needs are our priority.  As part of our continuing mission to provide you with exceptional heart care, we have created designated Provider Care Teams.  These Care Teams include your primary Cardiologist (physician) and Advanced Practice Providers (APPs -  Physician Assistants and Nurse Practitioners) who all work together to provide you with the care you need, when you need it.  We recommend signing up for the patient portal called "MyChart".  Sign up information is provided on this After Visit Summary.  MyChart is used to connect with patients for Virtual Visits (Telemedicine).  Patients are able to view lab/test results, encounter notes, upcoming appointments, etc.  Non-urgent messages can be sent to your provider as well.   To learn more about what you can do with MyChart, go to [email or web address].com.    Your next appointment:   12 month(s)  The format for your next appointment:   In Person  Provider:   [name] Hochrein, MD   Thank you for choosing [facility]!!     

## 2020-05-20 ENCOUNTER — Telehealth: Payer: Medicare HMO | Admitting: *Deleted

## 2020-05-20 ENCOUNTER — Telehealth: Payer: Self-pay | Admitting: *Deleted

## 2020-05-20 NOTE — Telephone Encounter (Signed)
  Chronic Care Management   Outreach Note  05/20/2020 Name: Amanda Booker MRN: [id number] DOB: Nov 02, 1950  Referred by: Junie Spencer, FNP Reason for referral : Chronic Care Management (Initial Visit)   An unsuccessful Initial Telephone Visit was attempted today. The patient was referred to the case management team for assistance with care management and care coordination.   Clinical Goals: . Over the next 30 days, patient will be contacted by a Care Guide to reschedule their CCM Visit  Interventions and Plan . Chart reviewed in preparation for telephone visit . Collaboration with other care team members as needed . A HIPAA compliant phone message was left for the patient providing contact information and requesting a return call.  . Request sent to care guides to reach out and reschedule patient's telephone visit   Demetrios Loll, BSN, RN-BC Embedded Chronic Care Manager [facility] Care Management Direct Dial: [phone]

## 2020-05-20 NOTE — Telephone Encounter (Signed)
Orders in daughter aware

## 2020-05-21 ENCOUNTER — Encounter: Payer: Self-pay | Admitting: Family

## 2020-05-21 ENCOUNTER — Other Ambulatory Visit: Payer: Self-pay | Admitting: Family

## 2020-05-21 DIAGNOSIS — Z1231 Encounter for screening mammogram for malignant neoplasm of breast: Secondary | ICD-10-CM

## 2020-05-24 NOTE — Telephone Encounter (Signed)
Please reschedule

## 2020-05-24 NOTE — Telephone Encounter (Signed)
Rescheduled for 06/14/2020

## 2020-05-31 ENCOUNTER — Other Ambulatory Visit: Payer: Self-pay | Admitting: Family

## 2020-05-31 DIAGNOSIS — E1159 Type 2 diabetes mellitus with other circulatory complications: Secondary | ICD-10-CM

## 2020-06-07 ENCOUNTER — Other Ambulatory Visit: Payer: Self-pay

## 2020-06-07 ENCOUNTER — Other Ambulatory Visit: Payer: Medicare HMO

## 2020-06-07 DIAGNOSIS — E785 Hyperlipidemia, unspecified: Secondary | ICD-10-CM

## 2020-06-07 DIAGNOSIS — E1159 Type 2 diabetes mellitus with other circulatory complications: Secondary | ICD-10-CM | POA: Diagnosis not present

## 2020-06-07 DIAGNOSIS — E1169 Type 2 diabetes mellitus with other specified complication: Secondary | ICD-10-CM | POA: Diagnosis not present

## 2020-06-07 DIAGNOSIS — I152 Hypertension secondary to endocrine disorders: Secondary | ICD-10-CM | POA: Diagnosis not present

## 2020-06-07 LAB — BAYER DCA HB A1C WAIVED: HB A1C (BAYER DCA - WAIVED): 7.6 % — ABNORMAL HIGH (ref <7.0)

## 2020-06-08 ENCOUNTER — Encounter: Payer: Self-pay | Admitting: Family

## 2020-06-08 ENCOUNTER — Ambulatory Visit (INDEPENDENT_AMBULATORY_CARE_PROVIDER_SITE_OTHER): Payer: Medicare HMO | Admitting: Family

## 2020-06-08 DIAGNOSIS — J449 Chronic obstructive pulmonary disease, unspecified: Secondary | ICD-10-CM | POA: Diagnosis not present

## 2020-06-08 DIAGNOSIS — E1159 Type 2 diabetes mellitus with other circulatory complications: Secondary | ICD-10-CM

## 2020-06-08 DIAGNOSIS — E785 Hyperlipidemia, unspecified: Secondary | ICD-10-CM

## 2020-06-08 DIAGNOSIS — I152 Hypertension secondary to endocrine disorders: Secondary | ICD-10-CM | POA: Diagnosis not present

## 2020-06-08 DIAGNOSIS — E1169 Type 2 diabetes mellitus with other specified complication: Secondary | ICD-10-CM | POA: Diagnosis not present

## 2020-06-08 LAB — CMP14+EGFR
ALT: 11 IU/L (ref 0–32)
AST: 11 IU/L (ref 0–40)
Albumin/Globulin Ratio: 1.8 (ref 1.2–2.2)
Albumin: 4.4 g/dL (ref 3.8–4.8)
Alkaline Phosphatase: 71 IU/L (ref 44–121)
BUN/Creatinine Ratio: 26 (ref 12–28)
BUN: 32 mg/dL — ABNORMAL HIGH (ref 8–27)
Bilirubin Total: 0.2 mg/dL (ref 0.0–1.2)
CO2: 22 mmol/L (ref 20–29)
Calcium: 10.4 mg/dL — ABNORMAL HIGH (ref 8.7–10.3)
Chloride: 102 mmol/L (ref 96–106)
Creatinine, Ser: 1.21 mg/dL — ABNORMAL HIGH (ref 0.57–1.00)
GFR calc Af Amer: 53 mL/min/{1.73_m2} — ABNORMAL LOW (ref >59)
GFR calc non Af Amer: 46 mL/min/{1.73_m2} — ABNORMAL LOW (ref >59)
Globulin, Total: 2.4 g/dL (ref 1.5–4.5)
Glucose: 163 mg/dL — ABNORMAL HIGH (ref 65–99)
Potassium: 4.9 mmol/L (ref 3.5–5.2)
Sodium: 139 mmol/L (ref 134–144)
Total Protein: 6.8 g/dL (ref 6.0–8.5)

## 2020-06-08 LAB — CBC WITH DIFFERENTIAL/PLATELET
Basophils Absolute: 0.1 10*3/uL (ref 0.0–0.2)
Basos: 1 %
EOS (ABSOLUTE): 0.2 10*3/uL (ref 0.0–0.4)
Eos: 3 %
Hematocrit: 40.9 % (ref 34.0–46.6)
Hemoglobin: 13.5 g/dL (ref 11.1–15.9)
Immature Grans (Abs): 0 10*3/uL (ref 0.0–0.1)
Immature Granulocytes: 0 %
Lymphocytes Absolute: 2.1 10*3/uL (ref 0.7–3.1)
Lymphs: 33 %
MCH: 29.5 pg (ref 26.6–33.0)
MCHC: 33 g/dL (ref 31.5–35.7)
MCV: 90 fL (ref 79–97)
Monocytes Absolute: 0.4 10*3/uL (ref 0.1–0.9)
Monocytes: 7 %
Neutrophils Absolute: 3.6 10*3/uL (ref 1.4–7.0)
Neutrophils: 56 %
Platelets: 341 10*3/uL (ref 150–450)
RBC: 4.57 x10E6/uL (ref 3.77–5.28)
RDW: 12.9 % (ref 11.7–15.4)
WBC: 6.4 10*3/uL (ref 3.4–10.8)

## 2020-06-08 LAB — LIPID PANEL
Chol/HDL Ratio: 2.9 ratio (ref 0.0–4.4)
Cholesterol, Total: 127 mg/dL (ref 100–199)
HDL: 44 mg/dL (ref >39)
LDL Chol Calc (NIH): 61 mg/dL (ref 0–99)
Triglycerides: 120 mg/dL (ref 0–149)
VLDL Cholesterol Cal: 22 mg/dL (ref 5–40)

## 2020-06-08 MED ORDER — AMLODIPINE BESYLATE 10 MG PO TABS
ORAL_TABLET | ORAL | 1 refills | Status: DC
Start: 2020-06-08 — End: 2020-10-27

## 2020-06-08 MED ORDER — ALBUTEROL SULFATE HFA 108 (90 BASE) MCG/ACT IN AERS: 2.0000 | INHALATION_SPRAY | Freq: Four times a day (QID) | RESPIRATORY_TRACT | 11 refills | Status: DC | PRN

## 2020-06-08 MED ORDER — METOPROLOL SUCCINATE ER 50 MG PO TB24: 100.0000 mg | ORAL_TABLET | Freq: Every day | ORAL | 1 refills | Status: DC

## 2020-06-08 MED ORDER — QUINAPRIL HCL 40 MG PO TABS: 40.0000 mg | ORAL_TABLET | Freq: Every day | ORAL | 3 refills | Status: DC

## 2020-06-08 MED ORDER — METFORMIN HCL 1000 MG PO TABS: 1000.0000 mg | ORAL_TABLET | Freq: Two times a day (BID) | ORAL | 3 refills | Status: DC

## 2020-06-08 MED ORDER — GLIPIZIDE 10 MG PO TABS: 10.0000 mg | ORAL_TABLET | Freq: Two times a day (BID) | ORAL | 1 refills | Status: DC

## 2020-06-08 MED ORDER — ATORVASTATIN CALCIUM 40 MG PO TABS: 40.0000 mg | ORAL_TABLET | Freq: Every morning | ORAL | 1 refills | Status: DC

## 2020-06-08 MED ORDER — FLUTICASONE-SALMETEROL 45-21 MCG/ACT IN AERO: 2.0000 | INHALATION_SPRAY | Freq: Two times a day (BID) | RESPIRATORY_TRACT | 12 refills | Status: DC

## 2020-06-08 MED ORDER — CLONIDINE HCL 0.3 MG PO TABS: 0.3000 mg | ORAL_TABLET | Freq: Two times a day (BID) | ORAL | 1 refills | Status: DC

## 2020-06-08 MED ORDER — HYDROCHLOROTHIAZIDE 25 MG PO TABS
ORAL_TABLET | ORAL | 0 refills | Status: DC
Start: 2020-06-08 — End: 2020-10-27

## 2020-06-08 MED ORDER — SPIRONOLACTONE 25 MG PO TABS: 25.0000 mg | ORAL_TABLET | Freq: Every day | ORAL | 3 refills | Status: DC

## 2020-06-08 NOTE — Patient Instructions (Signed)

## 2020-06-08 NOTE — Progress Notes (Signed)
Subjective:    Patient ID: Amanda Booker, female    DOB: 1950-10-03, 70 y.o.   MRN: [id number]  Chief Complaint  Patient presents with  . Diabetes   PT presents to the office today for chronic follow up. 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  Diabetes She presents for her follow-up diabetic visit. She has type 2 diabetes mellitus. Her disease course has been stable. There are no hypoglycemic associated symptoms. Pertinent negatives for diabetes include no blurred vision and no foot paresthesias. Symptoms are stable. Diabetic complications include a CVA. Risk factors for coronary artery disease include dyslipidemia, diabetes mellitus, hypertension, sedentary lifestyle and post-menopausal. She is following a generally healthy diet. Her overall blood glucose range is 130-140 mg/dl. An ACE inhibitor/angiotensin II receptor blocker is being taken. Eye exam is current.  Hypertension This is a chronic problem. The current episode started more than 1 year ago. The problem has been resolved since onset. The problem is controlled. Pertinent negatives include no blurred vision, malaise/fatigue, peripheral edema or shortness of breath. Risk factors for coronary artery disease include dyslipidemia, diabetes mellitus and sedentary lifestyle. The current treatment provides moderate improvement. Hypertensive end-organ damage includes CVA.  Hyperlipidemia This is a chronic problem. The current episode started more than 1 year ago. The problem is controlled. Recent lipid tests were reviewed and are normal. Pertinent negatives include no shortness of breath. Current antihyperlipidemic treatment includes statins. The current treatment provides moderate improvement of lipids. Risk factors for coronary artery disease include dyslipidemia, diabetes mellitus, hypertension, a sedentary lifestyle and post-menopausal.      Review of Systems  Constitutional:  Negative for malaise/fatigue.  Eyes: Negative for blurred vision.  Respiratory: Negative for shortness of breath.   All other systems reviewed and are negative.      Objective:   Physical Exam Vitals reviewed.  Constitutional:      General: She is not in acute distress.    Appearance: She is well-developed and well-nourished.  HENT:     Head: Normocephalic and atraumatic.     Right Ear: Tympanic membrane normal.     Left Ear: Tympanic membrane normal.     Mouth/Throat:     Mouth: Oropharynx is clear and moist.  Eyes:     Pupils: Pupils are equal, round, and reactive to light.  Neck:     Thyroid: No thyromegaly.  Cardiovascular:     Rate and Rhythm: Normal rate and regular rhythm.     Pulses: Intact distal pulses.     Heart sounds: Normal heart sounds. No murmur heard.   Pulmonary:     Effort: Pulmonary effort is normal. No respiratory distress.     Breath sounds: Normal breath sounds. No wheezing.  Abdominal:     General: Bowel sounds are normal. There is no distension.     Palpations: Abdomen is soft.     Tenderness: There is no abdominal tenderness.  Musculoskeletal:        General: No tenderness or edema. Normal range of motion.     Cervical back: Normal range of motion and neck supple.  Skin:    General: Skin is warm and dry.  Neurological:     Mental Status: She is alert and oriented to person, place, and time.     Cranial Nerves: No cranial nerve deficit.     Deep Tendon Reflexes: Reflexes are normal and symmetric.  Psychiatric:  Mood and Affect: Mood and affect normal.        Behavior: Behavior normal.        Thought Content: Thought content normal.        Judgment: Judgment normal.       BP (!) 156/81   Pulse 81   Temp (!) 97.1 F (36.2 C) (Temporal)   Ht _0  (1.702 m)   Wt 165 lb 12.8 oz (75.2 kg)   SpO2 98%   BMI 25.97 kg/m      Assessment & Plan:  Isaly Fasching comes in today with chief complaint of Diabetes   Diagnosis and  orders addressed:  1. DM type 2 with diabetic dyslipidemia ([place]) - metFORMIN (GLUCOPHAGE) 1000 MG tablet; Take 1 tablet (1,000 mg total) by mouth 2 (two) times daily with a meal.  Dispense: 180 tablet; Refill: 3 - glipiZIDE (GLUCOTROL) 10 MG tablet; Take 1 tablet (10 mg total) by mouth 2 (two) times daily before a meal.  Dispense: 180 tablet; Refill: 1 - BMP8+EGFR; Future  2. COPD (chronic obstructive pulmonary disease) with chronic bronchitis (HCC) - albuterol (VENTOLIN HFA) 108 (90 Base) MCG/ACT inhaler; Inhale 2 puffs into the lungs every 6 (six) hours as needed for wheezing or shortness of breath.  Dispense: 18 g; Refill: 11 - fluticasone-salmeterol (ADVAIR HFA) 45-21 MCG/ACT inhaler; Inhale 2 puffs into the lungs 2 (two) times daily.  Dispense: 1 each; Refill: 12 - BMP8+EGFR; Future  3. Hyperlipidemia associated with type 2 diabetes mellitus (HCC) - atorvastatin (LIPITOR) 40 MG tablet; Take 1 tablet (40 mg total) by mouth every morning.  Dispense: 90 tablet; Refill: 1 - BMP8+EGFR; Future  4. Hypertension associated with diabetes ([place]) - quinapril (ACCUPRIL) 40 MG tablet; Take 1 tablet (40 mg total) by mouth daily.  Dispense: 90 tablet; Refill: 3 - metoprolol succinate (TOPROL-XL) 50 MG 24 hr tablet; Take 2 tablets (100 mg total) by mouth daily. Take with or immediately following a meal.  Dispense: 180 tablet; Refill: 1 - hydrochlorothiazide (HYDRODIURIL) 25 MG tablet; TAKE 1 TABLET BY MOUTH ONCE DAILY IN THE MORNING  Dispense: 90 tablet; Refill: 0 - cloNIDine (CATAPRES) 0.3 MG tablet; Take 1 tablet (0.3 mg total) by mouth 2 (two) times daily.  Dispense: 180 tablet; Refill: 1 - amLODipine (NORVASC) 10 MG tablet; TAKE 1 TABLET BY MOUTH ONCE DAILY IN THE MORNING  Dispense: 90 tablet; Refill: 1 - spironolactone (ALDACTONE) 25 MG tablet; Take 1 tablet (25 mg total) by mouth daily.  Dispense: 90 tablet; Refill: 3 - BMP8+EGFR; Future   Labs pending Health Maintenance reviewed Diet and  exercise encouraged  Follow up plan: 3 months   Evelina Dun, FNP

## 2020-06-14 ENCOUNTER — Ambulatory Visit: Payer: Medicare HMO | Admitting: *Deleted

## 2020-06-14 DIAGNOSIS — I152 Hypertension secondary to endocrine disorders: Secondary | ICD-10-CM

## 2020-06-14 DIAGNOSIS — E1169 Type 2 diabetes mellitus with other specified complication: Secondary | ICD-10-CM

## 2020-06-14 DIAGNOSIS — E1159 Type 2 diabetes mellitus with other circulatory complications: Secondary | ICD-10-CM

## 2020-06-14 DIAGNOSIS — E785 Hyperlipidemia, unspecified: Secondary | ICD-10-CM

## 2020-06-14 DIAGNOSIS — J449 Chronic obstructive pulmonary disease, unspecified: Secondary | ICD-10-CM

## 2020-06-14 LAB — MICROALBUMIN / CREATININE URINE RATIO
Creatinine, Urine: 89.2 mg/dL
Microalb/Creat Ratio: 76 mg/g creat — ABNORMAL HIGH (ref 0–29)
Microalbumin, Urine: 67.7 ug/mL

## 2020-06-14 NOTE — Patient Instructions (Signed)
  Plan:  . The patient has been provided with contact information for the care management team and has been advised to call if they would like to re-enroll in the CCM program to receive care management and care coordination services.  . CCM enrollment status changed to "previously enrolled" as per patient request on 06/14/2020  to discontinue enrollment. Case closed to case management services in primary care home.  . Patient was provided with general disease management education and encouraged to follow-up with their PCP and specialists as recommended.   Demetrios Loll, BSN, RN-BC Embedded Chronic Care Manager [facility] / [facility] Care Management Direct Dial: [phone]

## 2020-06-14 NOTE — Chronic Care Management (AMB) (Signed)
  Chronic Care Management   Initial Visit Note  06/14/2020 Name: Amanda Booker MRN: [id number] DOB: 19-Oct-1950  Referred by: Junie Spencer, FNP Reason for referral : Chronic Care Management (Initial Visit)   Amanda Booker is a 70 y.o. year old female who is a primary care patient of Junie Spencer, FNP. The CCM team was consulted for assistance with chronic disease management and care coordination needs related to HTN, COPD and DM  Review of patient status, including review of consultants reports, relevant laboratory and other test results was performed prior to outreach.   I spoke with Amanda Booker's daughter, Amanda Booker, by telephone today regarding management of her chronic medical conditions. She lives with her daughter, Amanda Booker, who is a Designer, jewellery and is also cared for by her other daughter who is a Radiation protection practitioner. Amanda Booker feels that her chronic medical conditions are well managed and she does not have any other resource or CCM needs at this time.   SDOH (Social Determinants of Health) assessments performed: Yes See Care Plan activities for detailed interventions related to SDOH     Plan:  . The patient has been provided with contact information for the care management team and has been advised to call if they would like to re-enroll in the CCM program to receive care management and care coordination services.  . CCM enrollment status changed to "previously enrolled" as per patient request on 06/14/2020  to discontinue enrollment. Case closed to case management services in primary care home.  . Patient was provided with general disease management education and encouraged to follow-up with their PCP and specialists as recommended.   Demetrios Loll, BSN, RN-BC Embedded Chronic Care Manager [facility] / [facility] Care Management Direct Dial: [phone]

## 2020-06-16 ENCOUNTER — Other Ambulatory Visit: Payer: Self-pay

## 2020-06-16 ENCOUNTER — Ambulatory Visit
Admission: RE | Admit: 2020-06-16 | Discharge: 2020-06-16 | Disposition: A | Discharge status: Home / Self Care | Payer: Medicare HMO | Source: Ambulatory Visit | Attending: Family | Admitting: Family

## 2020-06-16 DIAGNOSIS — Z1231 Encounter for screening mammogram for malignant neoplasm of breast: Secondary | ICD-10-CM | POA: Diagnosis not present

## 2020-06-24 ENCOUNTER — Other Ambulatory Visit: Payer: Medicare HMO

## 2020-06-24 ENCOUNTER — Other Ambulatory Visit: Payer: Self-pay

## 2020-06-24 DIAGNOSIS — E1159 Type 2 diabetes mellitus with other circulatory complications: Secondary | ICD-10-CM | POA: Diagnosis not present

## 2020-06-24 DIAGNOSIS — J449 Chronic obstructive pulmonary disease, unspecified: Secondary | ICD-10-CM

## 2020-06-24 DIAGNOSIS — I152 Hypertension secondary to endocrine disorders: Secondary | ICD-10-CM | POA: Diagnosis not present

## 2020-06-24 DIAGNOSIS — E1169 Type 2 diabetes mellitus with other specified complication: Secondary | ICD-10-CM | POA: Diagnosis not present

## 2020-06-24 DIAGNOSIS — E785 Hyperlipidemia, unspecified: Secondary | ICD-10-CM | POA: Diagnosis not present

## 2020-06-25 ENCOUNTER — Other Ambulatory Visit: Payer: Self-pay | Admitting: Family

## 2020-06-25 LAB — BMP8+EGFR
BUN/Creatinine Ratio: 23 (ref 12–28)
BUN: 24 mg/dL (ref 8–27)
CO2: 20 mmol/L (ref 20–29)
Calcium: 10.5 mg/dL — ABNORMAL HIGH (ref 8.7–10.3)
Chloride: 103 mmol/L (ref 96–106)
Creatinine, Ser: 1.03 mg/dL — ABNORMAL HIGH (ref 0.57–1.00)
Glucose: 104 mg/dL — ABNORMAL HIGH (ref 65–99)
Potassium: 5.1 mmol/L (ref 3.5–5.2)
Sodium: 142 mmol/L (ref 134–144)
eGFR: 60 mL/min/{1.73_m2} (ref >59)

## 2020-06-29 ENCOUNTER — Other Ambulatory Visit: Payer: Self-pay

## 2020-06-29 ENCOUNTER — Other Ambulatory Visit: Payer: Medicare HMO

## 2020-06-30 LAB — PTH, INTACT AND CALCIUM
Calcium: 10.3 mg/dL (ref 8.7–10.3)
PTH: 45 pg/mL (ref 15–65)

## 2020-07-06 ENCOUNTER — Ambulatory Visit (INDEPENDENT_AMBULATORY_CARE_PROVIDER_SITE_OTHER): Payer: Medicare HMO | Admitting: *Deleted

## 2020-07-06 VITALS — BP 150/80 | Ht 67.0 in | Wt 165.0 lb

## 2020-07-06 DIAGNOSIS — Z0000 Encounter for general adult medical examination without abnormal findings: Secondary | ICD-10-CM | POA: Diagnosis not present

## 2020-07-06 NOTE — Patient Instructions (Signed)
  Amanda Booker , Thank you for taking time to come for your Medicare Wellness Visit. I appreciate your ongoing commitment to your health goals. Please review the following plan we discussed and let me know if I can assist you in the future.   These are the goals we discussed: Goals    . Patient Stated     07/03/2019 AWV Goal: Exercise for General Health   Patient will verbalize understanding of the benefits of increased physical activity:  Exercising regularly is important. It will improve your overall fitness, flexibility, and endurance.  Regular exercise also will improve your overall health. It can help you control your weight, reduce stress, and improve your bone density.  Over the next year, patient will increase physical activity as tolerated with a goal of at least 150 minutes of moderate physical activity per week.   You can tell that you are exercising at a moderate intensity if your heart starts beating faster and you start breathing faster but can still hold a conversation.  Moderate-intensity exercise ideas include:  Walking 1 mile (1.6 km) in about 15 minutes  Biking  Hiking  Golfing  Dancing  Water aerobics  Patient will verbalize understanding of everyday activities that increase physical activity by providing examples like the following: ? Yard work, such as: ? Pushing a Surveyor, mining ? Raking and bagging leaves ? Washing your car ? Pushing a stroller ? Shoveling snow ? Gardening ? Washing windows or floors  Patient will be able to explain general safety guidelines for exercising:   Before you start a new exercise program, talk with your health care provider.  Do not exercise so much that you hurt yourself, feel dizzy, or get very short of breath.  Wear comfortable clothes and wear shoes with good support.  Drink plenty of water while you exercise to prevent dehydration or heat stroke.  Work out until your breathing and your heartbeat get faster.         This is a list of the screening recommended for you and due dates:  Health Maintenance  Topic Date Due  . Tetanus Vaccine  Never done  . Eye exam for diabetics  06/30/2016  . Pneumonia vaccines (2 of 2 - PPSV23) 07/07/2020  . Hemoglobin A1C  12/05/2020  . Complete foot exam   01/14/2021  . Mammogram  06/16/2022  . Colon Cancer Screening  02/11/2025  . Flu Shot  Completed  . DEXA scan (bone density measurement)  Completed  . COVID-19 Vaccine  Completed  .  Hepatitis C: One time screening is recommended by Center for Disease Control  (CDC) for  adults born from [birth year] through 1963.   Completed  . HPV Vaccine  Aged Out

## 2020-07-06 NOTE — Progress Notes (Signed)
2    MEDICARE ANNUAL WELLNESS VISIT  07/06/2020  Telephone Visit Disclaimer This Medicare AWV was conducted by telephone due to national recommendations for restrictions regarding the COVID-19 Pandemic (e.g. social distancing).  I verified, using two identifiers, that I am speaking with Amanda Booker or their authorized healthcare agent. I discussed the limitations, risks, security, and privacy concerns of performing an evaluation and management service by telephone and the potential availability of an in-person appointment in the future. The patient expressed understanding and agreed to proceed.  Location of Patient: in her home  Location of Provider (nurse):  In office  Subjective:    Amanda Booker is Booker 70 y.o. female patient of Amanda Booker, Amanda Bohristy A, FNP who had Booker Medicare Annual Wellness Visit today via telephone. [name] is Disabled and lives with their family. she has 3 children. she reports that she is socially active and does interact with friends/family regularly. she is not physically active and enjoys word puzzles and watching TV.  Patient Care Team: [name], [name], FNP as PCP - General (Family Medicine) [name], [name], MD as PCP - Cardiology (Cardiology)  Advanced Directives 07/06/2020 07/03/2019 10/03/2013 03/27/2013 06/30/2011  Does Patient Have Booker Medical Advance Directive? No No Patient would like information Patient does not have advance directive Patient would not like information  Would patient like information on creating Booker medical advance directive? No - Patient declined No - Patient declined Advance directive brochure given (Outpatient ONLY) - -  Pre-existing out of facility DNR order (yellow form or pink MOST form) - - - No No    Hospital Utilization Over the Past 12 Months: # of hospitalizations or ER visits: 0 # of surgeries: 0  Review of Systems    Patient reports that her overall health is unchanged compared to last year.  General ROS: negative  Patient  Reported Readings (BP, Pulse, CBG, Weight, etc) BP (!) 150/80   Ht 5\' 7"  (1.702 m)   Wt 165 lb (74.8 kg)   BMI 25.84 kg/m    Pain Assessment       Current Medications & Allergies (verified) Allergies as of 07/06/2020   No Known Allergies     Medication List       Accurate as of July 06, 2020  2:20 PM. If you have any questions, ask your nurse or doctor.        albuterol (5 MG/ML) 0.5% nebulizer solution Commonly known as: PROVENTIL Take 0.5 mLs (2.5 mg total) by nebulization every 6 (six) hours as needed for wheezing or shortness of breath.   albuterol 108 (90 Base) MCG/ACT inhaler Commonly known as: VENTOLIN HFA Inhale 2 puffs into the lungs every 6 (six) hours as needed for wheezing or shortness of breath.   amLODipine 10 MG tablet Commonly known as: NORVASC TAKE 1 TABLET BY MOUTH ONCE DAILY IN THE MORNING   aspirin 81 MG tablet Take 81 mg by mouth every morning.   atorvastatin 40 MG tablet Commonly known as: LIPITOR Take 1 tablet (40 mg total) by mouth every morning.   cloNIDine 0.3 MG tablet Commonly known as: Catapres Take 1 tablet (0.3 mg total) by mouth 2 (two) times daily.   fluticasone-salmeterol 45-21 MCG/ACT inhaler Commonly known as: ADVAIR HFA Inhale 2 puffs into the lungs 2 (two) times daily.   glipiZIDE 10 MG tablet Commonly known as: GLUCOTROL Take 1 tablet (10 mg total) by mouth 2 (two) times daily before Booker meal.   glucose blood test strip Check BS  daily Dx E11.69   hydrochlorothiazide 25 MG tablet Commonly known as: HYDRODIURIL TAKE 1 TABLET BY MOUTH ONCE DAILY IN THE MORNING   metFORMIN 1000 MG tablet Commonly known as: Glucophage Take 1 tablet (1,000 mg total) by mouth 2 (two) times daily with Booker meal.   metoprolol succinate 50 MG 24 hr tablet Commonly known as: TOPROL-XL Take 2 tablets (100 mg total) by mouth daily. Take with or immediately following Booker meal.   quinapril 40 MG tablet Commonly known as: ACCUPRIL Take 1 tablet  (40 mg total) by mouth daily.   spironolactone 25 MG tablet Commonly known as: ALDACTONE Take 1 tablet (25 mg total) by mouth daily.       History (reviewed): Past Medical History:  Diagnosis Date  . Cerebral aneurysm rupture (HCC)   . Cerebral ventricular shunt fitting or adjustment   . Chronic kidney disease   . Coagulopathy (HCC)   . COPD (chronic obstructive pulmonary disease) (HCC)   . Diabetes mellitus   . Hypertension   . Hypokalemia   . Kidney stone   . Renal artery stenosis [facility])    Past Surgical History:  Procedure Laterality Date  . ABDOMINAL HYSTERECTOMY  2007  . cerebral shunt     Family History  Problem Relation Age of Onset  . Cancer Mother        uterine  . Hyperlipidemia Mother   . Cerebral aneurysm Sister   . Hypertension Sister   . Kidney failure Sister   . Diabetes Brother   . Hypertension Brother   . Diabetes Brother   . Hypertension Brother    Social History   Socioeconomic History  . Marital status: Widowed    Spouse name: Not on file  . Number of children: 3  . Years of education: Not on file  . Highest education level: 9th grade  Occupational History  . Occupation: Designer, fashion/clothing    Comment: retired  Tobacco Use  . Smoking status: Current Every Day Smoker    Packs/day: 0.75    Years: 60.00    Pack years: 45.00    Types: Cigarettes  . Smokeless tobacco: Never Used  Vaping Use  . Vaping Use: Never used  Substance and Sexual Activity  . Alcohol use: No  . Drug use: No  . Sexual activity: Not Currently  Other Topics Concern  . 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 She lives with her daughter April. She has 3 grown children that she sees regularly. She reports that she is not physically active. She does attend church when she is able and she enjoys crossword puzzles and word searches.    Social Determinants of Health   Financial Resource Strain: Low Risk   . Difficulty of Paying Living Expenses: Not  hard at all  Food Insecurity: No Food Insecurity  . Worried About Programme researcher, broadcasting/film/video in the Last Year: Never true  . Ran Out of Food in the Last Year: Never true  Transportation Needs: No Transportation Needs  . Lack of Transportation (Medical): No  . Lack of Transportation (Non-Medical): No  Physical Activity: Not on file  Stress: Not on file  Social Connections: Not on file    Activities of Daily Living In your present state of health, do you have any difficulty performing the following activities: 07/06/2020  Hearing? N  Vision? Y  Comment RX glasses  Difficulty concentrating or making decisions? Y  Walking or climbing stairs? Jeannie Fend  Comment previous stroke  Dressing or bathing? N  Doing errands, shopping? N  Preparing Food and eating ? N  Using the Toilet? N  In the past six months, have you accidently leaked urine? N  Do you have problems with loss of bowel control? N  Managing your Medications? Y  Managing your Finances? N  Housekeeping or managing your Housekeeping? Y  Some recent data might be hidden    Patient Education/ Literacy    Exercise Current Exercise Habits: The patient does not participate in regular exercise at present, Exercise limited by: None identified  Diet Patient reports consuming 2 meals Booker day and 1 snack(s) Booker day Patient reports that her primary diet is: Regular Patient reports that she does have regular access to food.   Depression Screen PHQ 2/9 Scores 07/06/2020 06/08/2020 01/15/2020 10/03/2019 07/03/2019 07/02/2019 04/02/2019  PHQ - 2 Score 0 0 0 0 0 0 0     Fall Risk Fall Risk  07/06/2020 06/08/2020 01/15/2020 10/03/2019 07/03/2019  Falls in the past year? 0 0 0 0 0  Number falls in past yr: - - - - -  Injury with Fall? - - - - -     Objective:  Valyncia Rabecca Birge seemed alert and oriented and she participated appropriately during our telephone visit.  Blood Pressure Weight BMI  BP Readings from Last 3 Encounters:  07/06/20 (!) 150/80   06/08/20 (!) 156/81  05/19/20 140/76   Wt Readings from Last 3 Encounters:  07/06/20 165 lb (74.8 kg)  06/08/20 165 lb 12.8 oz (75.2 kg)  05/19/20 167 lb (75.8 kg)   BMI Readings from Last 1 Encounters:  07/06/20 25.84 kg/m    *Unable to obtain current vital signs, weight, and BMI due to telephone visit type  Hearing/Vision  . Dalayla did not seem to have difficulty with hearing/understanding during the telephone conversation . Reports that she has had Booker formal eye exam by an eye care professional within the past year . Reports that she has not had Booker formal hearing evaluation within the past year *Unable to fully assess hearing and vision during telephone visit type  Cognitive Function: 6CIT Screen 07/03/2019  What Year? 0 points  What month? 0 points  What time? 0 points  Count back from 20 0 points  Months in reverse 0 points  Repeat phrase 6 points  Total Score 6   (Normal:0-7, Significant for Dysfunction: >8)  Normal Cognitive Function Screening: NO DONE - pt refused   Immunization & Health Maintenance Record Immunization History  Administered Date(s) Administered  . Fluad Quad(high Dose 65+) 04/02/2019, 01/15/2020  . Influenza,inj,Quad PF,6+ Mos 02/14/2018  . PFIZER(Purple Top)SARS-COV-2 Vaccination 08/04/2019, 08/25/2019, 03/06/2020  . Pneumococcal Conjugate-13 03/28/2018  . Pneumococcal Polysaccharide-23 07/08/2015    Health Maintenance  Topic Date Due  . TETANUS/TDAP  Never done  . OPHTHALMOLOGY EXAM  06/30/2016  . PNA vac Low Risk Adult (2 of 2 - PPSV23) 07/07/2020  . HEMOGLOBIN A1C  12/05/2020  . FOOT EXAM  01/14/2021  . MAMMOGRAM  06/16/2022  . COLONOSCOPY (Pts 45-82yrs Insurance coverage will need to be confirmed)  02/11/2025  . INFLUENZA VACCINE  Completed  . DEXA SCAN  Completed  . COVID-19 Vaccine  Completed  . Hepatitis C Screening  Completed  . HPV VACCINES  Aged Out       Assessment  This is Booker routine wellness examination for [facility].  Health Maintenance: Due or Overdue Health Maintenance Due  Topic Date  Due  . TETANUS/TDAP  Never done  . OPHTHALMOLOGY EXAM  06/30/2016    Aleida Mae Hafford does not need Booker referral for Community Assistance: Care Management:   no Social Work:    no Prescription Assistance:  no Nutrition/Diabetes Education:  no   Plan:  Personalized Goals Goals Addressed            This Visit's Progress   . Patient Stated   Not on track    07/03/2019 AWV Goal: Exercise for General Health   Patient will verbalize understanding of the benefits of increased physical activity:  Exercising regularly is important. It will improve your overall fitness, flexibility, and endurance.  Regular exercise also will improve your overall health. It can help you control your weight, reduce stress, and improve your bone density.  Over the next year, patient will increase physical activity as tolerated with Booker goal of at least 150 minutes of moderate physical activity per week.   You can tell that you are exercising at Booker moderate intensity if your heart starts beating faster and you start breathing faster but can still hold Booker conversation.  Moderate-intensity exercise ideas include:  Walking 1 mile (1.6 km) in about 15 minutes  Biking  Hiking  Golfing  Dancing  Water aerobics  Patient will verbalize understanding of everyday activities that increase physical activity by providing examples like the following: ? Yard work, such as: ? Pushing Booker Surveyor, mining ? Raking and bagging leaves ? Washing your car ? Pushing Booker stroller ? Shoveling snow ? Gardening ? Washing windows or floors  Patient will be able to explain general safety guidelines for exercising:   Before you start Booker new exercise program, talk with your health care provider.  Do not exercise so much that you hurt yourself, feel dizzy, or get very short of breath.  Wear comfortable clothes and wear shoes with good  support.  Drink plenty of water while you exercise to prevent dehydration or heat stroke.  Work out until your breathing and your heartbeat get faster.       Personalized Health Maintenance & Screening Recommendations  Td vaccine  Lung Cancer Screening Recommended: no (Low Dose CT Chest recommended if Age 49-80 years, 30 pack-year currently smoking OR have quit w/in past 15 years) Hepatitis C Screening recommended: no HIV Screening recommended: no  Advanced Directives: Written information was not prepared per patient's request.  Referrals & Orders No orders of the defined types were placed in this encounter.   Follow-up Plan . Follow-up with Junie Spencer, FNP as planned    I have personally reviewed and noted the following in the patient's chart:   . Medical and social history . Use of alcohol, tobacco or illicit drugs  . Current medications and supplements . Functional ability and status . Nutritional status . Physical activity . Advanced directives . List of other physicians . Hospitalizations, surgeries, and ER visits in previous 12 months . Vitals . Screenings to include cognitive, depression, and falls . Referrals and appointments  In addition, I have reviewed and discussed with Amanda Memos certain preventive protocols, quality metrics, and best practice recommendations. Booker written personalized care plan for preventive services as well as general preventive health recommendations is available and can be mailed to the patient at her request.      Leilani Merl  07/06/2020

## 2020-09-10 ENCOUNTER — Ambulatory Visit: Payer: Medicare HMO | Admitting: Family

## 2020-09-14 ENCOUNTER — Encounter: Payer: Self-pay | Admitting: Family

## 2020-10-15 ENCOUNTER — Encounter: Payer: Self-pay | Admitting: Family

## 2020-10-15 ENCOUNTER — Ambulatory Visit (INDEPENDENT_AMBULATORY_CARE_PROVIDER_SITE_OTHER): Payer: Medicare HMO | Admitting: Family

## 2020-10-15 ENCOUNTER — Other Ambulatory Visit: Payer: Self-pay

## 2020-10-15 VITALS — BP 127/71 | HR 64 | Temp 97.8°F | Resp 20 | Ht 67.0 in | Wt 157.0 lb

## 2020-10-15 DIAGNOSIS — E1159 Type 2 diabetes mellitus with other circulatory complications: Secondary | ICD-10-CM

## 2020-10-15 DIAGNOSIS — I152 Hypertension secondary to endocrine disorders: Secondary | ICD-10-CM

## 2020-10-15 DIAGNOSIS — I701 Atherosclerosis of renal artery: Secondary | ICD-10-CM

## 2020-10-15 DIAGNOSIS — R69 Illness, unspecified: Secondary | ICD-10-CM | POA: Diagnosis not present

## 2020-10-15 DIAGNOSIS — I69354 Hemiplegia and hemiparesis following cerebral infarction affecting left non-dominant side: Secondary | ICD-10-CM | POA: Diagnosis not present

## 2020-10-15 DIAGNOSIS — E785 Hyperlipidemia, unspecified: Secondary | ICD-10-CM

## 2020-10-15 DIAGNOSIS — F17210 Nicotine dependence, cigarettes, uncomplicated: Secondary | ICD-10-CM

## 2020-10-15 DIAGNOSIS — E1169 Type 2 diabetes mellitus with other specified complication: Secondary | ICD-10-CM | POA: Diagnosis not present

## 2020-10-15 LAB — CMP14+EGFR
ALT: 8 IU/L (ref 0–32)
AST: 12 IU/L (ref 0–40)
Albumin/Globulin Ratio: 1.9 (ref 1.2–2.2)
Albumin: 4.4 g/dL (ref 3.8–4.8)
Alkaline Phosphatase: 60 IU/L (ref 44–121)
BUN/Creatinine Ratio: 25 (ref 12–28)
BUN: 31 mg/dL — ABNORMAL HIGH (ref 8–27)
Bilirubin Total: 0.3 mg/dL (ref 0.0–1.2)
CO2: 21 mmol/L (ref 20–29)
Calcium: 10.6 mg/dL — ABNORMAL HIGH (ref 8.7–10.3)
Chloride: 104 mmol/L (ref 96–106)
Creatinine, Ser: 1.26 mg/dL — ABNORMAL HIGH (ref 0.57–1.00)
Globulin, Total: 2.3 g/dL (ref 1.5–4.5)
Glucose: 117 mg/dL — ABNORMAL HIGH (ref 65–99)
Potassium: 5.1 mmol/L (ref 3.5–5.2)
Sodium: 139 mmol/L (ref 134–144)
Total Protein: 6.7 g/dL (ref 6.0–8.5)
eGFR: 47 mL/min/{1.73_m2} — ABNORMAL LOW (ref >59)

## 2020-10-15 LAB — CBC WITH DIFFERENTIAL/PLATELET
Basophils Absolute: 0.1 10*3/uL (ref 0.0–0.2)
Basos: 1 %
EOS (ABSOLUTE): 0.2 10*3/uL (ref 0.0–0.4)
Eos: 3 %
Hematocrit: 38.3 % (ref 34.0–46.6)
Hemoglobin: 12.7 g/dL (ref 11.1–15.9)
Immature Grans (Abs): 0 10*3/uL (ref 0.0–0.1)
Immature Granulocytes: 0 %
Lymphocytes Absolute: 2.4 10*3/uL (ref 0.7–3.1)
Lymphs: 35 %
MCH: 29.7 pg (ref 26.6–33.0)
MCHC: 33.2 g/dL (ref 31.5–35.7)
MCV: 90 fL (ref 79–97)
Monocytes Absolute: 0.5 10*3/uL (ref 0.1–0.9)
Monocytes: 7 %
Neutrophils Absolute: 3.7 10*3/uL (ref 1.4–7.0)
Neutrophils: 54 %
Platelets: 329 10*3/uL (ref 150–450)
RBC: 4.28 x10E6/uL (ref 3.77–5.28)
RDW: 12.6 % (ref 11.7–15.4)
WBC: 7 10*3/uL (ref 3.4–10.8)

## 2020-10-15 LAB — LIPID PANEL
Chol/HDL Ratio: 3 ratio (ref 0.0–4.4)
Cholesterol, Total: 139 mg/dL (ref 100–199)
HDL: 47 mg/dL (ref >39)
LDL Chol Calc (NIH): 74 mg/dL (ref 0–99)
Triglycerides: 95 mg/dL (ref 0–149)
VLDL Cholesterol Cal: 18 mg/dL (ref 5–40)

## 2020-10-15 LAB — BAYER DCA HB A1C WAIVED: HB A1C (BAYER DCA - WAIVED): 7.1 % — ABNORMAL HIGH (ref <7.0)

## 2020-10-15 NOTE — Progress Notes (Signed)
Subjective:    Patient ID: Amanda Booker, female    DOB: July 24, 1950, 70 y.o.   MRN: [id number]  Chief Complaint  Patient presents with   Medical Management of Chronic Issues   PT presents to the office today for chronic follow up. 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 She is followed by Cardiologists annually.   Hypertension This is a chronic problem. The current episode started more than 1 year ago. The problem has been resolved since onset. The problem is controlled. Pertinent negatives include no blurred vision, malaise/fatigue, peripheral edema or shortness of breath. Risk factors for coronary artery disease include dyslipidemia, diabetes mellitus and sedentary lifestyle. The current treatment provides moderate improvement. There is no history of CVA or heart failure.  Diabetes She presents for her follow-up diabetic visit. She has type 2 diabetes mellitus. There are no hypoglycemic associated symptoms. Pertinent negatives for diabetes include no blurred vision and no foot paresthesias. Symptoms are stable. Pertinent negatives for diabetic complications include no CVA. Risk factors for coronary artery disease include dyslipidemia, diabetes mellitus, hypertension and sedentary lifestyle. Her overall blood glucose range is 130-140 mg/dl. An ACE inhibitor/angiotensin II receptor blocker is being taken.  Hyperlipidemia This is a chronic problem. The current episode started more than 1 year ago. The problem is controlled. Recent lipid tests were reviewed and are normal. Pertinent negatives include no shortness of breath. Current antihyperlipidemic treatment includes statins. The current treatment provides moderate improvement of lipids. Risk factors for coronary artery disease include dyslipidemia, hypertension and a sedentary lifestyle.  Nicotine Dependence Presents for follow-up visit. Her urge triggers include company of smokers. The symptoms have been stable. She smokes < 1/2 a  pack of cigarettes per day.  COPD Pt continues to smoke 1/2 pack a day. States her breathing is stable.    Review of Systems  Constitutional:  Negative for malaise/fatigue.  Eyes:  Negative for blurred vision.  Respiratory:  Negative for shortness of breath.   All other systems reviewed and are negative.     Objective:   Physical Exam Vitals reviewed.  Constitutional:      General: She is not in acute distress.    Appearance: She is well-developed.  HENT:     Head: Normocephalic and atraumatic.     Right Ear: Tympanic membrane normal.     Left Ear: Tympanic membrane normal.  Eyes:     Pupils: Pupils are equal, round, and reactive to light.  Neck:     Thyroid: No thyromegaly.  Cardiovascular:     Rate and Rhythm: Normal rate and regular rhythm.     Heart sounds: Normal heart sounds. No murmur heard. Pulmonary:     Effort: Pulmonary effort is normal. No respiratory distress.     Breath sounds: Normal breath sounds. No wheezing.  Abdominal:     General: Bowel sounds are normal. There is no distension.     Palpations: Abdomen is soft.     Tenderness: There is no abdominal tenderness.  Musculoskeletal:        General: No tenderness. Normal range of motion.     Cervical back: Normal range of motion and neck supple.  Skin:    General: Skin is warm and dry.  Neurological:     Mental Status: She is alert and oriented to person, place, and time.     Cranial Nerves: No cranial nerve deficit.     Deep Tendon Reflexes: Reflexes are normal and symmetric.  Psychiatric:  Behavior: Behavior normal.        Thought Content: Thought content normal.        Judgment: Judgment normal.      BP 127/71   Pulse 64   Temp 97.8 F (36.6 C) (Temporal)   Resp 20   Ht 5' 7"  (1.702 m)   Wt 157 lb (71.2 kg)   SpO2 97%   BMI 24.59 kg/m   Assessment & Plan:  [name] comes in today with chief complaint of Medical Management of Chronic Issues   Diagnosis and orders  addressed:  1. DM type 2 with diabetic dyslipidemia ([place]) - Bayer DCA Hb A1c Waived - CBC with Differential/Platelet - CMP14+EGFR  2. Hypertension associated with diabetes ([place]) - CBC with Differential/Platelet - CMP14+EGFR  3. Hyperlipidemia associated with type 2 diabetes mellitus ([place]) - CBC with Differential/Platelet - CMP14+EGFR - Lipid panel  4. Atherosclerosis of renal artery (HCC) - CBC with Differential/Platelet - CMP14+EGFR  5. Hemiparesis affecting left side as late effect of cerebrovascular accident (CVA) ([place]) - CBC with Differential/Platelet - CMP14+EGFR  6. Cigarette nicotine dependence without complication - CBC with Differential/Platelet - CMP14+EGFR   Labs pending Health Maintenance reviewed Diet and exercise encouraged  Follow up plan: 6 months   Evelina Dun, FNP

## 2020-10-15 NOTE — Patient Instructions (Signed)

## 2020-10-19 ENCOUNTER — Other Ambulatory Visit: Payer: Self-pay | Admitting: Family

## 2020-10-27 ENCOUNTER — Other Ambulatory Visit: Payer: Self-pay | Admitting: Family

## 2020-10-27 DIAGNOSIS — E1159 Type 2 diabetes mellitus with other circulatory complications: Secondary | ICD-10-CM

## 2020-11-05 LAB — HM DIABETES EYE EXAM

## 2021-02-07 ENCOUNTER — Other Ambulatory Visit: Payer: Self-pay | Admitting: Family

## 2021-02-07 DIAGNOSIS — E1159 Type 2 diabetes mellitus with other circulatory complications: Secondary | ICD-10-CM

## 2021-02-07 DIAGNOSIS — I152 Hypertension secondary to endocrine disorders: Secondary | ICD-10-CM

## 2021-02-14 ENCOUNTER — Other Ambulatory Visit: Payer: Self-pay | Admitting: Family

## 2021-02-14 DIAGNOSIS — E1159 Type 2 diabetes mellitus with other circulatory complications: Secondary | ICD-10-CM

## 2021-02-14 DIAGNOSIS — I152 Hypertension secondary to endocrine disorders: Secondary | ICD-10-CM

## 2021-04-15 ENCOUNTER — Ambulatory Visit: Payer: Medicare HMO | Admitting: Family

## 2021-04-19 ENCOUNTER — Ambulatory Visit (INDEPENDENT_AMBULATORY_CARE_PROVIDER_SITE_OTHER): Payer: Medicare HMO | Admitting: Family

## 2021-04-19 ENCOUNTER — Other Ambulatory Visit: Payer: Self-pay | Admitting: Family

## 2021-04-19 ENCOUNTER — Encounter: Payer: Self-pay | Admitting: Family

## 2021-04-19 VITALS — BP 102/59 | HR 72 | Temp 97.9°F | Ht 67.0 in | Wt 161.8 lb

## 2021-04-19 DIAGNOSIS — J449 Chronic obstructive pulmonary disease, unspecified: Secondary | ICD-10-CM | POA: Diagnosis not present

## 2021-04-19 DIAGNOSIS — J4489 Other specified chronic obstructive pulmonary disease: Secondary | ICD-10-CM

## 2021-04-19 DIAGNOSIS — I152 Hypertension secondary to endocrine disorders: Secondary | ICD-10-CM

## 2021-04-19 DIAGNOSIS — E1169 Type 2 diabetes mellitus with other specified complication: Secondary | ICD-10-CM

## 2021-04-19 DIAGNOSIS — I69354 Hemiplegia and hemiparesis following cerebral infarction affecting left non-dominant side: Secondary | ICD-10-CM

## 2021-04-19 DIAGNOSIS — R69 Illness, unspecified: Secondary | ICD-10-CM | POA: Diagnosis not present

## 2021-04-19 DIAGNOSIS — E785 Hyperlipidemia, unspecified: Secondary | ICD-10-CM

## 2021-04-19 DIAGNOSIS — F17210 Nicotine dependence, cigarettes, uncomplicated: Secondary | ICD-10-CM

## 2021-04-19 DIAGNOSIS — E1159 Type 2 diabetes mellitus with other circulatory complications: Secondary | ICD-10-CM

## 2021-04-19 LAB — CMP14+EGFR
ALT: 9 IU/L (ref 0–32)
AST: 10 IU/L (ref 0–40)
Albumin/Globulin Ratio: 2.1 (ref 1.2–2.2)
Albumin: 4.2 g/dL (ref 3.8–4.8)
Alkaline Phosphatase: 67 IU/L (ref 44–121)
BUN/Creatinine Ratio: 30 — ABNORMAL HIGH (ref 12–28)
BUN: 40 mg/dL — ABNORMAL HIGH (ref 8–27)
Bilirubin Total: 0.2 mg/dL (ref 0.0–1.2)
CO2: 20 mmol/L (ref 20–29)
Calcium: 10.3 mg/dL (ref 8.7–10.3)
Chloride: 107 mmol/L — ABNORMAL HIGH (ref 96–106)
Creatinine, Ser: 1.32 mg/dL — ABNORMAL HIGH (ref 0.57–1.00)
Globulin, Total: 2 g/dL (ref 1.5–4.5)
Glucose: 154 mg/dL — ABNORMAL HIGH (ref 70–99)
Potassium: 4.7 mmol/L (ref 3.5–5.2)
Sodium: 140 mmol/L (ref 134–144)
Total Protein: 6.2 g/dL (ref 6.0–8.5)
eGFR: 44 mL/min/{1.73_m2} — ABNORMAL LOW (ref >59)

## 2021-04-19 LAB — CBC WITH DIFFERENTIAL/PLATELET
Basophils Absolute: 0.1 10*3/uL (ref 0.0–0.2)
Basos: 1 %
EOS (ABSOLUTE): 0.2 10*3/uL (ref 0.0–0.4)
Eos: 3 %
Hematocrit: 35.6 % (ref 34.0–46.6)
Hemoglobin: 11.8 g/dL (ref 11.1–15.9)
Immature Grans (Abs): 0 10*3/uL (ref 0.0–0.1)
Immature Granulocytes: 0 %
Lymphocytes Absolute: 1.8 10*3/uL (ref 0.7–3.1)
Lymphs: 27 %
MCH: 29.8 pg (ref 26.6–33.0)
MCHC: 33.1 g/dL (ref 31.5–35.7)
MCV: 90 fL (ref 79–97)
Monocytes Absolute: 0.4 10*3/uL (ref 0.1–0.9)
Monocytes: 6 %
Neutrophils Absolute: 4 10*3/uL (ref 1.4–7.0)
Neutrophils: 63 %
Platelets: 309 10*3/uL (ref 150–450)
RBC: 3.96 x10E6/uL (ref 3.77–5.28)
RDW: 12.2 % (ref 11.7–15.4)
WBC: 6.4 10*3/uL (ref 3.4–10.8)

## 2021-04-19 LAB — BAYER DCA HB A1C WAIVED: HB A1C (BAYER DCA - WAIVED): 7.1 % — ABNORMAL HIGH (ref 4.8–5.6)

## 2021-04-19 MED ORDER — METFORMIN HCL 1000 MG PO TABS: 1000.0000 mg | ORAL_TABLET | Freq: Two times a day (BID) | ORAL | 3 refills | Status: DC

## 2021-04-19 MED ORDER — METOPROLOL SUCCINATE ER 50 MG PO TB24: 100.0000 mg | ORAL_TABLET | Freq: Every day | ORAL | 1 refills | Status: DC

## 2021-04-19 MED ORDER — BLOOD GLUCOSE METER KIT: PACK | 0 refills | Status: DC

## 2021-04-19 MED ORDER — ALBUTEROL SULFATE (5 MG/ML) 0.5% IN NEBU: 2.5000 mg | INHALATION_SOLUTION | Freq: Four times a day (QID) | RESPIRATORY_TRACT | 12 refills | Status: DC | PRN

## 2021-04-19 MED ORDER — HYDROCHLOROTHIAZIDE 25 MG PO TABS: 25.0000 mg | ORAL_TABLET | Freq: Every morning | ORAL | 0 refills | Status: DC

## 2021-04-19 MED ORDER — QUINAPRIL HCL 40 MG PO TABS
40.0000 mg | ORAL_TABLET | Freq: Every day | ORAL | 3 refills | Status: DC
Start: 2021-04-19 — End: 2021-05-30

## 2021-04-19 MED ORDER — GLIPIZIDE 10 MG PO TABS: 10.0000 mg | ORAL_TABLET | Freq: Two times a day (BID) | ORAL | 1 refills | Status: DC

## 2021-04-19 MED ORDER — ALBUTEROL SULFATE HFA 108 (90 BASE) MCG/ACT IN AERS: 2.0000 | INHALATION_SPRAY | Freq: Four times a day (QID) | RESPIRATORY_TRACT | 11 refills | Status: DC | PRN

## 2021-04-19 MED ORDER — ATORVASTATIN CALCIUM 40 MG PO TABS: 40.0000 mg | ORAL_TABLET | Freq: Every morning | ORAL | 1 refills | Status: DC

## 2021-04-19 MED ORDER — FLUTICASONE-SALMETEROL 45-21 MCG/ACT IN AERO: 2.0000 | INHALATION_SPRAY | Freq: Two times a day (BID) | RESPIRATORY_TRACT | 12 refills | Status: DC

## 2021-04-19 MED ORDER — SPIRONOLACTONE 25 MG PO TABS: 25.0000 mg | ORAL_TABLET | Freq: Every day | ORAL | 3 refills | Status: DC

## 2021-04-19 MED ORDER — AMLODIPINE BESYLATE 10 MG PO TABS
10.0000 mg | ORAL_TABLET | Freq: Every morning | ORAL | 0 refills | Status: DC
Start: 2021-04-19 — End: 2021-10-14

## 2021-04-19 MED ORDER — CLONIDINE HCL 0.3 MG PO TABS: 0.3000 mg | ORAL_TABLET | Freq: Two times a day (BID) | ORAL | 1 refills | Status: DC

## 2021-04-19 NOTE — Patient Instructions (Signed)
Health Maintenance After Age 68 After age 68, you are at a higher risk for certain long-term diseases and infections as well as injuries from falls. Falls are a major cause of broken bones and head injuries in people who are older than age 68. Getting regular preventive care can help to keep you healthy and well. Preventive care includes getting regular testing and making lifestyle changes as recommended by your health care provider. Talk with your health care provider about: Which screenings and tests you should have. A screening is a test that checks for a disease when you have no symptoms. A diet and exercise plan that is right for you. What should I know about screenings and tests to prevent falls? Screening and testing are the best ways to find a health problem early. Early diagnosis and treatment give you the best chance of managing medical conditions that are common after age 68. Certain conditions and lifestyle choices may make you more likely to have a fall. Your health care provider may recommend: Regular vision checks. Poor vision and conditions such as cataracts can make you more likely to have a fall. If you wear glasses, make sure to get your prescription updated if your vision changes. Medicine review. Work with your health care provider to regularly review all of the medicines you are taking, including over-the-counter medicines. Ask your health care provider about any side effects that may make you more likely to have a fall. Tell your health care provider if any medicines that you take make you feel dizzy or sleepy. Strength and balance checks. Your health care provider may recommend certain tests to check your strength and balance while standing, walking, or changing positions. Foot health exam. Foot pain and numbness, as well as not wearing proper footwear, can make you more likely to have a fall. Screenings, including: Osteoporosis screening. Osteoporosis is a condition that causes  the bones to get weaker and break more easily. Blood pressure screening. Blood pressure changes and medicines to control blood pressure can make you feel dizzy. Depression screening. You may be more likely to have a fall if you have a fear of falling, feel depressed, or feel unable to do activities that you used to do. Alcohol use screening. Using too much alcohol can affect your balance and may make you more likely to have a fall. Follow these instructions at home: Lifestyle Do not drink alcohol if: Your health care provider tells you not to drink. If you drink alcohol: Limit how much you have to: 0-1 drink a day for women. 0-2 drinks a day for men. Know how much alcohol is in your drink. In the [place] one drink equals one 12 oz bottle of beer (355 mL), one 5 oz glass of wine (148 mL), or one 1 oz glass of hard liquor (44 mL). Do not use any products that contain nicotine or tobacco. These products include cigarettes, chewing tobacco, and vaping devices, such as e-cigarettes. If you need help quitting, ask your health care provider. Activity  Follow a regular exercise program to stay fit. This will help you maintain your balance. Ask your health care provider what types of exercise are appropriate for you. If you need a cane or walker, use it as recommended by your health care provider. Wear supportive shoes that have nonskid soles. Safety  Remove any tripping hazards, such as rugs, cords, and clutter. Install safety equipment such as grab bars in bathrooms and safety rails on stairs. Keep rooms and walkways   well-lit. General instructions Talk with your health care provider about your risks for falling. Tell your health care provider if: You fall. Be sure to tell your health care provider about all falls, even ones that seem minor. You feel dizzy, tiredness (fatigue), or off-balance. Take over-the-counter and prescription medicines only as told by your health care provider. These include  supplements. Eat a healthy diet and maintain a healthy weight. A healthy diet includes low-fat dairy products, low-fat (lean) meats, and fiber from whole grains, beans, and lots of fruits and vegetables. Stay current with your vaccines. Schedule regular health, dental, and eye exams. Summary Having a healthy lifestyle and getting preventive care can help to protect your health and wellness after age 68. Screening and testing are the best way to find a health problem early and help you avoid having a fall. Early diagnosis and treatment give you the best chance for managing medical conditions that are more common for people who are older than age 68. Falls are a major cause of broken bones and head injuries in people who are older than age 68. Take precautions to prevent a fall at home. Work with your health care provider to learn what changes you can make to improve your health and wellness and to prevent falls. This information is not intended to replace advice given to you by your health care provider. Make sure you discuss any questions you have with your health care provider. Document Revised: 08/30/2020 Document Reviewed: 08/30/2020 Elsevier Patient Education  2022 [employer]  

## 2021-04-19 NOTE — Progress Notes (Signed)
° °Subjective:  ° ° Patient ID: Amanda Booker, female    DOB: 06/06/1950, 70 y.o.   MRN: [id number] ° °No chief complaint on file. ° °PT presents to the office today for chronic follow up. 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 She is followed by Cardiologists annually.  ° °She has COPD and denies any SOB. She continues to smoke 1/2 pack a day.  °Hypertension °Pertinent negatives include no blurred vision. Hypertensive end-organ damage includes CVA.  °Hyperlipidemia °This is a chronic problem. The current episode started more than 1 year ago. Exacerbating diseases include obesity. She has no history of nephrotic syndrome. Current antihyperlipidemic treatment includes statins. The current treatment provides moderate improvement of lipids. Risk factors for coronary artery disease include diabetes mellitus, hypertension, post-menopausal and a sedentary lifestyle.  °Diabetes °She presents for her follow-up diabetic visit. She has type 2 diabetes mellitus. Pertinent negatives for diabetes include no blurred vision and no foot paresthesias. Diabetic complications include a CVA. Pertinent negatives for diabetic complications include no heart disease or peripheral neuropathy. Risk factors for coronary artery disease include dyslipidemia, diabetes mellitus, hypertension, sedentary lifestyle and post-menopausal. She is following a generally healthy diet. Her overall blood glucose range is 130-140 mg/dl. Eye exam is current.  °Nicotine Dependence °Presents for follow-up visit. Her urge triggers include company of smokers. The symptoms have been stable. She smokes < 1/2 a pack of cigarettes per day.  ° ° ° °Review of Systems  °Eyes:  Negative for blurred vision.  °All other systems reviewed and are negative. ° °   °Objective:  ° Physical Exam °Vitals reviewed.  °Constitutional:   °   General: She is not in acute distress. °   Appearance: She is well-developed.  °HENT:  °   Head: Normocephalic and atraumatic.  °    Right Ear: Tympanic membrane normal.  °   Left Ear: Tympanic membrane normal.  °Eyes:  °   Pupils: Pupils are equal, round, and reactive to light.  °Neck:  °   Thyroid: No thyromegaly.  °Cardiovascular:  °   Rate and Rhythm: Normal rate and regular rhythm.  °   Heart sounds: Normal heart sounds. No murmur heard. °Pulmonary:  °   Effort: Pulmonary effort is normal. No respiratory distress.  °   Breath sounds: Normal breath sounds. No wheezing.  °Abdominal:  °   General: Bowel sounds are normal. There is no distension.  °   Palpations: Abdomen is soft.  °   Tenderness: There is no abdominal tenderness.  °Musculoskeletal:     °   General: No tenderness.  °   Cervical back: Normal range of motion and neck supple.  °   Comments: Left sided weakness  °Skin: °   General: Skin is warm and dry.  °Neurological:  °   Mental Status: She is alert and oriented to person, place, and time.  °   Cranial Nerves: No cranial nerve deficit.  °   Deep Tendon Reflexes: Reflexes are normal and symmetric.  °Psychiatric:     °   Behavior: Behavior normal.     °   Thought Content: Thought content normal.     °   Judgment: Judgment normal.  ° ° ° ° °BP (!) 102/59    Pulse 72    Temp 97.9 °F (36.6 °C) (Temporal)    Ht 5' 7" (1.702 m)    Wt 161 lb 12.8 oz (73.4 kg)    BMI   BMI 25.34 kg/m      Assessment & Plan:  Amanda Booker comes in today with chief complaint of No chief complaint on file.   Diagnosis and orders addressed:  1. Hypertension associated with diabetes ([place]) - quinapril (ACCUPRIL) 40 MG tablet; Take 1 tablet (40 mg total) by mouth daily.  Dispense: 90 tablet; Refill: 3 - spironolactone (ALDACTONE) 25 MG tablet; Take 1 tablet (25 mg total) by mouth daily.  Dispense: 90 tablet; Refill: 3 - metoprolol succinate (TOPROL-XL) 50 MG 24 hr tablet; Take 2 tablets (100 mg total) by mouth daily. Take with or immediately following a meal.  Dispense: 180 tablet; Refill: 1 - cloNIDine (CATAPRES) 0.3 MG tablet; Take 1 tablet (0.3 mg  total) by mouth 2 (two) times daily.  Dispense: 180 tablet; Refill: 1 - amLODipine (NORVASC) 10 MG tablet; Take 1 tablet (10 mg total) by mouth every morning.  Dispense: 90 tablet; Refill: 0 - hydrochlorothiazide (HYDRODIURIL) 25 MG tablet; Take 1 tablet (25 mg total) by mouth every morning.  Dispense: 90 tablet; Refill: 0 - CMP14+EGFR - CBC with Differential/Platelet  2. Hyperlipidemia associated with type 2 diabetes mellitus (HCC) - atorvastatin (LIPITOR) 40 MG tablet; Take 1 tablet (40 mg total) by mouth every morning.  Dispense: 90 tablet; Refill: 1 - CMP14+EGFR - CBC with Differential/Platelet  3. DM type 2 with diabetic dyslipidemia (HCC) - glipiZIDE (GLUCOTROL) 10 MG tablet; Take 1 tablet (10 mg total) by mouth 2 (two) times daily before a meal.  Dispense: 180 tablet; Refill: 1 - metFORMIN (GLUCOPHAGE) 1000 MG tablet; Take 1 tablet (1,000 mg total) by mouth 2 (two) times daily with a meal.  Dispense: 180 tablet; Refill: 3 - CMP14+EGFR - CBC with Differential/Platelet - Bayer DCA Hb A1c Waived  4. COPD (chronic obstructive pulmonary disease) with chronic bronchitis (HCC) - albuterol (VENTOLIN HFA) 108 (90 Base) MCG/ACT inhaler; Inhale 2 puffs into the lungs every 6 (six) hours as needed for wheezing or shortness of breath.  Dispense: 18 g; Refill: 11 - fluticasone-salmeterol (ADVAIR HFA) 45-21 MCG/ACT inhaler; Inhale 2 puffs into the lungs 2 (two) times daily.  Dispense: 1 each; Refill: 12 - CMP14+EGFR - CBC with Differential/Platelet  5. Hemiparesis affecting left side as late effect of cerebrovascular accident (CVA) ([place]) - CMP14+EGFR - CBC with Differential/Platelet  6. Cigarette nicotine dependence without complication - KVQ25+ZDGL - CBC with Differential/Platelet   Labs pending Health Maintenance reviewed Diet and exercise encouraged  Follow up plan: 6 months    Evelina Dun, FNP

## 2021-04-21 ENCOUNTER — Other Ambulatory Visit: Payer: Self-pay | Admitting: Family

## 2021-04-21 MED ORDER — ALBUTEROL SULFATE (2.5 MG/3ML) 0.083% IN NEBU: 2.5000 mg | INHALATION_SOLUTION | RESPIRATORY_TRACT | 2 refills | Status: DC | PRN

## 2021-05-27 ENCOUNTER — Telehealth: Payer: Self-pay | Admitting: *Deleted

## 2021-05-27 NOTE — Telephone Encounter (Signed)
Fax from [facility] RE: Quinapril 40 mg 1 QD Note from pharmacy: on longterm backorder, please change to a different drug OV & Rx 04/19/21 #90 with 3 refills Next OV 10/18/21 Please advise

## 2021-05-30 MED ORDER — LISINOPRIL 40 MG PO TABS: 40.0000 mg | ORAL_TABLET | Freq: Every day | ORAL | 3 refills | Status: DC

## 2021-05-30 NOTE — Addendum Note (Signed)
Addended by: Evelina Dun A on: 05/30/2021 09:52 AM   Modules accepted: Orders

## 2021-05-30 NOTE — Telephone Encounter (Signed)
Will change Quinapril 40 mg to Lisinopril 40 mg given long term backorder. Please make sure not to take these together.

## 2021-07-12 ENCOUNTER — Ambulatory Visit (INDEPENDENT_AMBULATORY_CARE_PROVIDER_SITE_OTHER): Payer: Medicare HMO

## 2021-07-12 VITALS — Wt 154.0 lb

## 2021-07-12 DIAGNOSIS — Z0000 Encounter for general adult medical examination without abnormal findings: Secondary | ICD-10-CM | POA: Diagnosis not present

## 2021-07-12 NOTE — Progress Notes (Signed)
? ?Subjective:  ? Amanda Booker is a 71 y.o. female who presents for Medicare Annual (Subsequent) preventive examination. ? ?Virtual Visit via Telephone Note ? ?I connected with  Amanda Booker on 07/12/21 at 12:00 PM EDT by telephone and verified that I am speaking with the correct person using two identifiers. ? ?Location: ?Patient: Home ?Provider: [facility] ?Persons participating in the virtual visit: patient/Nurse Health Advisor ?  ?I discussed the limitations, risks, security and privacy concerns of performing an evaluation and management service by telephone and the availability of in person appointments. The patient expressed understanding and agreed to proceed. ? ?Interactive audio and video telecommunications were attempted between this nurse and patient, however failed, due to patient having technical difficulties OR patient did not have access to video capability.  We continued and completed visit with audio only. ? ?Some vital signs may be absent or patient reported.  ? ?Amanda Grimley Dionne Ano, LPN  ? ?Review of Systems    ? ?Cardiac Risk Factors include: advanced age (>82mn, >>35women);diabetes mellitus;dyslipidemia;hypertension;family history of premature cardiovascular disease;smoking/ tobacco exposure;sedentary lifestyle;Other (see comment), Risk factor comments: hx of CVA, atherosclerosis, COPD ? ?   ?Objective:  ?  ?Today's Vitals  ? 07/12/21 1207  ?Weight: 154 lb (69.9 kg)  ? ?Body mass index is 24.12 kg/m?. ? ?Advanced Directives 07/12/2021 07/06/2020 07/03/2019 10/03/2013 03/27/2013 06/30/2011  ?Does Patient Have a Medical Advance Directive? No No No Patient would like information Patient does not have advance directive Patient would not like information  ?Would patient like information on creating a medical advance directive? No - Patient declined No - Patient declined No - Patient declined Advance directive brochure given (Outpatient ONLY) - -  ?Pre-existing out of facility DNR order (yellow form or pink  MOST form) - - - - No No  ? ? ?Current Medications (verified) ?Outpatient Encounter Medications as of 07/12/2021  ?Medication Sig  ? albuterol (PROVENTIL) (2.5 MG/3ML) 0.083% nebulizer solution Take 3 mLs (2.5 mg total) by nebulization every 4 (four) hours as needed for wheezing or shortness of breath.  ? albuterol (VENTOLIN HFA) 108 (90 Base) MCG/ACT inhaler Inhale 2 puffs into the lungs every 6 (six) hours as needed for wheezing or shortness of breath.  ? amLODipine (NORVASC) 10 MG tablet Take 1 tablet (10 mg total) by mouth every morning.  ? aspirin 81 MG tablet Take 81 mg by mouth every morning.   ? atorvastatin (LIPITOR) 40 MG tablet Take 1 tablet (40 mg total) by mouth every morning.  ? blood glucose meter kit and supplies Dispense based on patient and insurance preference. Use up to four times daily as directed. (FOR ICD-10 E10.9, E11.9).  ? cloNIDine (CATAPRES) 0.3 MG tablet Take 1 tablet (0.3 mg total) by mouth 2 (two) times daily.  ? fluticasone-salmeterol (ADVAIR HFA) 45-21 MCG/ACT inhaler Inhale 2 puffs into the lungs 2 (two) times daily.  ? glipiZIDE (GLUCOTROL) 10 MG tablet Take 1 tablet (10 mg total) by mouth 2 (two) times daily before a meal.  ? glucose blood test strip Check BS daily Dx E11.69  ? hydrochlorothiazide (HYDRODIURIL) 25 MG tablet Take 1 tablet (25 mg total) by mouth every morning.  ? Lancets (ONETOUCH DELICA PLUS [id number] MISC 4 (four) times daily. use as directed  ? lisinopril (ZESTRIL) 40 MG tablet Take 1 tablet (40 mg total) by mouth daily.  ? metFORMIN (GLUCOPHAGE) 1000 MG tablet Take 1 tablet (1,000 mg total) by mouth 2 (two) times daily with a meal.  ?  metoprolol succinate (TOPROL-XL) 50 MG 24 hr tablet Take 2 tablets (100 mg total) by mouth daily. Take with or immediately following a meal.  ? spironolactone (ALDACTONE) 25 MG tablet Take 1 tablet (25 mg total) by mouth daily.  ? ?No facility-administered encounter medications on file as of 07/12/2021.  ? ? ?Allergies  (verified) ?Patient has no known allergies.  ? ?History: ?Past Medical History:  ?Diagnosis Date  ? Cerebral aneurysm rupture ([place])   ? Cerebral ventricular shunt fitting or adjustment   ? Chronic kidney disease   ? Coagulopathy ([place])   ? COPD (chronic obstructive pulmonary disease) ([place])   ? Diabetes mellitus   ? Hypertension   ? Hypokalemia   ? Kidney stone   ? Renal artery stenosis ([place])   ? ?Past Surgical History:  ?Procedure Laterality Date  ? ABDOMINAL HYSTERECTOMY  2007  ? cerebral shunt    ? ?Family History  ?Problem Relation Age of Onset  ? Cancer Mother   ?     uterine  ? Hyperlipidemia Mother   ? Cerebral aneurysm Sister   ? Hypertension Sister   ? Kidney failure Sister   ? Diabetes Brother   ? Hypertension Brother   ? Diabetes Brother   ? Hypertension Brother   ? ?Social History  ? ?Socioeconomic History  ? Marital status: Widowed  ?  Spouse name: Not on file  ? Number of children: 3  ? Years of education: Not on file  ? Highest education level: 9th grade  ?Occupational History  ? 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 Smoking status: Every Day  ?  Packs/day: 0.75  ?  Years: 60.00  ?  Pack years: 45.00  ?  Types: Cigarettes  ? Smokeless tobacco: Never  ?Vaping Use  ? Vaping Use: Never used  ?Substance and Sexual Activity  ? Alcohol use: No  ? Drug use: No  ? Sexual activity: Not Currently  ?Other Topics Concern  ? Not on file  ?Social History Narrative  ? 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 She lives with her daughter April. She has 3 grown children that she sees regularly. She reports that she is not physically active. She does attend church when she is able and she enjoys crossword puzzles and word searches.   ? ?Social Determinants of Health  ? ?Financial Resource Strain: Low Risk   ? Difficulty of Paying Living Expenses: Not hard at all  ?Food Insecurity: No Food Insecurity  ? Worried About Charity fundraiser in the Last Year: Never true  ? Ran Out of Food in the Last Year: Never true   ?Transportation Needs: No Transportation Needs  ? Lack of Transportation (Medical): No  ? Lack of Transportation (Non-Medical): No  ?Physical Activity: Insufficiently Active  ? Days of Exercise per Week: 7 days  ? Minutes of Exercise per Session: 20 min  ?Stress: No Stress Concern Present  ? Feeling of Stress : Not at all  ?Social Connections: Moderately Integrated  ? Frequency of Communication with Friends and Family: More than three times a week  ? Frequency of Social Gatherings with Friends and Family: More than three times a week  ? Attends Religious Services: More than 4 times per year  ? Active Member of Clubs or Organizations: Yes  ? Attends Archivist Meetings: More than 4 times per year  ? Marital Status: Widowed  ? ? ?Tobacco Counseling ?Ready to quit: Not Answered ?Counseling given: Not Answered ? ? ?  Clinical Intake: ? ?Pre-visit preparation completed: Yes ? ?Pain : No/denies pain ? ?  ? ?BMI - recorded: 24.12 ?Nutritional Status: BMI of 19-24  Normal ?Nutritional Risks: None ?Diabetes: Yes ?CBG done?: No ?Did pt. bring in CBG monitor from home?: No ? ?How often do you need to have someone help you when you read instructions, pamphlets, or other written materials from your doctor or pharmacy?: 1 - Never ? ?Diabetic? Nutrition Risk Assessment: ? ?Has the patient had any N/V/D within the last 2 months?  Yes  ?Does the patient have any non-healing wounds?  No  ?Has the patient had any unintentional weight loss or weight gain?  No  ? ?Diabetes: ? ?Is the patient diabetic?  Yes  ?If diabetic, was a CBG obtained today?  No  ?Did the patient bring in their glucometer from home?  No  ?How often do you monitor your CBG's? Once daily fasting - twice if it was high in the morning.  ? ?Financial Strains and Diabetes Management: ? ?Are you having any financial strains with the device, your supplies or your medication? No .  ?Does the patient want to be seen by Chronic Care Management for management of their  diabetes?  No  ?Would the patient like to be referred to a Nutritionist or for Diabetic Management?  No  ? ?Diabetic Exams: ? ?Diabetic Eye Exam: Completed 11/05/2020.  ? ?Diabetic Foot Exam: Completed 04/19/2021. P

## 2021-07-12 NOTE — Patient Instructions (Signed)
[name] , ?Thank you for taking time to come for your Medicare Wellness Visit. I appreciate your ongoing commitment to your health goals. Please review the following plan we discussed and let me know if I can assist you in the future.  ? ?Screening recommendations/referrals: ?Colonoscopy: Done 02/12/2015 - Repeat in 10 years ?Mammogram: Done 06/16/2020 - Repeat annually *past due - make appointment soon ?Bone Density: Done 07/11/2018 - Repeat in 5 years  ?Recommended yearly ophthalmology/optometry visit for glaucoma screening and checkup ?Recommended yearly dental visit for hygiene and checkup ? ?Vaccinations: ?Influenza vaccine: Done 04/07/2021 - Repeat annually ?Pneumococcal vaccine: Done 07/08/2015 & 03/28/2018 ?Tdap vaccine: Due - recommended every 10 years ?Shingles vaccine: Due - Shingrix is 2 doses 2-6 months apart and over 90% effective     ?Covid-19: Done 08/04/19, 08/25/19, 03/06/20 ? ?Advanced directives: Advance directive discussed with you today. Even though you declined this today, please call our office should you change your mind, and we can give you the proper paperwork for you to fill out.  ? ?Conditions/risks identified: Aim for 30 minutes of exercise or brisk walking, 6-8 glasses of water, and 5 servings of fruits and vegetables each day.  ? ?Next appointment: Follow up in one year for your annual wellness visit  ? ? ?Preventive Care 5365 Years and Older, Female ?Preventive care refers to lifestyle choices and visits with your health care provider that can promote health and wellness. ?What does preventive care include? ?A yearly physical exam. This is also called an annual well check. ?Dental exams once or twice a year. ?Routine eye exams. Ask your health care provider how often you should have your eyes checked. ?Personal lifestyle choices, including: ?Daily care of your teeth and gums. ?Regular physical activity. ?Eating a healthy diet. ?Avoiding tobacco and drug use. ?Limiting alcohol  use. ?Practicing safe sex. ?Taking low-dose aspirin every day. ?Taking vitamin and mineral supplements as recommended by your health care provider. ?What happens during an annual well check? ?The services and screenings done by your health care provider during your annual well check will depend on your age, overall health, lifestyle risk factors, and family history of disease. ?Counseling  ?Your health care provider may ask you questions about your: ?Alcohol use. ?Tobacco use. ?Drug use. ?Emotional well-being. ?Home and relationship well-being. ?Sexual activity. ?Eating habits. ?History of falls. ?Memory and ability to understand (cognition). ?Work and work Astronomerenvironment. ?Reproductive health. ?Screening  ?You may have the following tests or measurements: ?Height, weight, and BMI. ?Blood pressure. ?Lipid and cholesterol levels. These may be checked every 5 years, or more frequently if you are over 69 years old. ?Skin check. ?Lung cancer screening. You may have this screening every year starting at age 69 if you have a 30-pack-year history of smoking and currently smoke or have quit within the past 15 years. ?Fecal occult blood test (FOBT) of the stool. You may have this test every year starting at age 69. ?Flexible sigmoidoscopy or colonoscopy. You may have a sigmoidoscopy every 5 years or a colonoscopy every 10 years starting at age 69. ?Hepatitis C blood test. ?Hepatitis B blood test. ?Sexually transmitted disease (STD) testing. ?Diabetes screening. This is done by checking your blood sugar (glucose) after you have not eaten for a while (fasting). You may have this done every 1-3 years. ?Bone density scan. This is done to screen for osteoporosis. You may have this done starting at age 69. ?Mammogram. This may be done every 1-2 years. Talk to your health care  provider about how often you should have regular mammograms. ?Talk with your health care provider about your test results, treatment options, and if necessary,  the need for more tests. ?Vaccines  ?Your health care provider may recommend certain vaccines, such as: ?Influenza vaccine. This is recommended every year. ?Tetanus, diphtheria, and acellular pertussis (Tdap, Td) vaccine. You may need a Td booster every 10 years. ?Zoster vaccine. You may need this after age 85. ?Pneumococcal 13-valent conjugate (PCV13) vaccine. One dose is recommended after age 34. ?Pneumococcal polysaccharide (PPSV23) vaccine. One dose is recommended after age 75. ?Talk to your health care provider about which screenings and vaccines you need and how often you need them. ?This information is not intended to replace advice given to you by your health care provider. Make sure you discuss any questions you have with your health care provider. ?Document Released: 05/07/2015 Document Revised: 12/29/2015 Document Reviewed: 02/09/2015 ?Elsevier Interactive Patient Education ? 2017 [employer] ? ?Fall Prevention in the Home ?Falls can cause injuries. They can happen to people of all ages. There are many things you can do to make your home safe and to help prevent falls. ?What can I do on the outside of my home? ?Regularly fix the edges of walkways and driveways and fix any cracks. ?Remove anything that might make you trip as you walk through a door, such as a raised step or threshold. ?Trim any bushes or trees on the path to your home. ?Use bright outdoor lighting. ?Clear any walking paths of anything that might make someone trip, such as rocks or tools. ?Regularly check to see if handrails are loose or broken. Make sure that both sides of any steps have handrails. ?Any raised decks and porches should have guardrails on the edges. ?Have any leaves, snow, or ice cleared regularly. ?Use sand or salt on walking paths during winter. ?Clean up any spills in your garage right away. This includes oil or grease spills. ?What can I do in the bathroom? ?Use night lights. ?Install grab bars by the toilet and in the  tub and shower. Do not use towel bars as grab bars. ?Use non-skid mats or decals in the tub or shower. ?If you need to sit down in the shower, use a plastic, non-slip stool. ?Keep the floor dry. Clean up any water that spills on the floor as soon as it happens. ?Remove soap buildup in the tub or shower regularly. ?Attach bath mats securely with double-sided non-slip rug tape. ?Do not have throw rugs and other things on the floor that can make you trip. ?What can I do in the bedroom? ?Use night lights. ?Make sure that you have a light by your bed that is easy to reach. ?Do not use any sheets or blankets that are too big for your bed. They should not hang down onto the floor. ?Have a firm chair that has side arms. You can use this for support while you get dressed. ?Do not have throw rugs and other things on the floor that can make you trip. ?What can I do in the kitchen? ?Clean up any spills right away. ?Avoid walking on wet floors. ?Keep items that you use a lot in easy-to-reach places. ?If you need to reach something above you, use a strong step stool that has a grab bar. ?Keep electrical cords out of the way. ?Do not use floor polish or wax that makes floors slippery. If you must use wax, use non-skid floor wax. ?Do not have throw rugs  and other things on the floor that can make you trip. ?What can I do with my stairs? ?Do not leave any items on the stairs. ?Make sure that there are handrails on both sides of the stairs and use them. Fix handrails that are broken or loose. Make sure that handrails are as long as the stairways. ?Check any carpeting to make sure that it is firmly attached to the stairs. Fix any carpet that is loose or worn. ?Avoid having throw rugs at the top or bottom of the stairs. If you do have throw rugs, attach them to the floor with carpet tape. ?Make sure that you have a light switch at the top of the stairs and the bottom of the stairs. If you do not have them, ask someone to add them for  you. ?What else can I do to help prevent falls? ?Wear shoes that: ?Do not have high heels. ?Have rubber bottoms. ?Are comfortable and fit you well. ?Are closed at the toe. Do not wear sandals. ?If you use a step

## 2021-08-10 ENCOUNTER — Telehealth: Payer: Self-pay | Admitting: *Deleted

## 2021-08-10 NOTE — Telephone Encounter (Signed)
Fax from pt plan came in stating that she will be given a TEMP supply of Fluticasone-Salmeterol 45-21MCG/ACT aerosol on 08/09/21. ? ? ?A PA was started:  ? ?Key: [id number] ?Sent to plan  ?

## 2021-08-10 NOTE — Telephone Encounter (Signed)
Your request was denied ?We have denied coverage or payment under your Medicare Part D benefit for the following prescription drug(s)  ?that you or your prescriber requested: FLUTICASONE-SALMETEROL Aerosol ?Why did we deny your request? ?We denied this request under Medicare Part D because: The requested drug is not on your plan's formulary  ?(list of covered drugs). Your Medicare Part D drug plan was asked to cover a drug that is not on the formulary  ?(this is called a formulary exception). Your prescriber did not provide the detailed information that is required  ?in order to approve the request. To receive a formulary exception, per the Part D Coverage Determination  ?guidance Section 40.5.2 and 40.5.3, your prescriber must provide information that documents at least one of  ?the following has occurred:  ?- You have tried the formulary drugs for the treatment of your condition and they did not work for you. ?OR ?- The formulary drugs could cause adverse effects. ?OR ?- The formulary drugs would be less effective for your condition than the requested drug.  ?Talk to your prescriber to see if the following covered alternative(s) would be right for you:  ?Advair Diskus inhaler (quantity limit of 60 doses every 30 days) ? ? ? ? ?Recommended drug:  ?Advair HFA inhaler (quantity limit of 12 grams every 30 days) ?Breo Ellipta inhaler (quantity limit of 60 doses every 30 days) ?Symbicort inhaler (quantity limit of 10.20 grams every 30 days) ?Breztri Aerosphere inhaler (quantity limit of 10.70 grams every 30 days) ?Trelegy Ellipta inhaler (quantity limit of 60 doses every 30 days). ? ?

## 2021-09-05 ENCOUNTER — Other Ambulatory Visit: Payer: Self-pay | Admitting: Family

## 2021-09-05 DIAGNOSIS — E1159 Type 2 diabetes mellitus with other circulatory complications: Secondary | ICD-10-CM

## 2021-10-14 ENCOUNTER — Other Ambulatory Visit: Payer: Self-pay | Admitting: Family

## 2021-10-14 DIAGNOSIS — I152 Hypertension secondary to endocrine disorders: Secondary | ICD-10-CM

## 2021-10-18 ENCOUNTER — Encounter: Payer: Self-pay | Admitting: Family

## 2021-10-18 ENCOUNTER — Ambulatory Visit (INDEPENDENT_AMBULATORY_CARE_PROVIDER_SITE_OTHER): Payer: Medicare HMO | Admitting: Family

## 2021-10-18 VITALS — BP 126/74 | HR 62 | Temp 97.9°F | Ht 67.0 in | Wt 160.8 lb

## 2021-10-18 DIAGNOSIS — E1159 Type 2 diabetes mellitus with other circulatory complications: Secondary | ICD-10-CM

## 2021-10-18 DIAGNOSIS — E663 Overweight: Secondary | ICD-10-CM

## 2021-10-18 DIAGNOSIS — I701 Atherosclerosis of renal artery: Secondary | ICD-10-CM

## 2021-10-18 DIAGNOSIS — I152 Hypertension secondary to endocrine disorders: Secondary | ICD-10-CM

## 2021-10-18 DIAGNOSIS — R69 Illness, unspecified: Secondary | ICD-10-CM | POA: Diagnosis not present

## 2021-10-18 DIAGNOSIS — J4489 Other specified chronic obstructive pulmonary disease: Secondary | ICD-10-CM

## 2021-10-18 DIAGNOSIS — Z23 Encounter for immunization: Secondary | ICD-10-CM | POA: Diagnosis not present

## 2021-10-18 DIAGNOSIS — E1169 Type 2 diabetes mellitus with other specified complication: Secondary | ICD-10-CM | POA: Diagnosis not present

## 2021-10-18 DIAGNOSIS — E785 Hyperlipidemia, unspecified: Secondary | ICD-10-CM | POA: Diagnosis not present

## 2021-10-18 DIAGNOSIS — J449 Chronic obstructive pulmonary disease, unspecified: Secondary | ICD-10-CM

## 2021-10-18 DIAGNOSIS — I69354 Hemiplegia and hemiparesis following cerebral infarction affecting left non-dominant side: Secondary | ICD-10-CM | POA: Diagnosis not present

## 2021-10-18 DIAGNOSIS — F17210 Nicotine dependence, cigarettes, uncomplicated: Secondary | ICD-10-CM

## 2021-10-18 LAB — BAYER DCA HB A1C WAIVED: HB A1C (BAYER DCA - WAIVED): 6.4 % — ABNORMAL HIGH (ref 4.8–5.6)

## 2021-10-19 LAB — CBC WITH DIFFERENTIAL/PLATELET
Basophils Absolute: 0.1 10*3/uL (ref 0.0–0.2)
Basos: 1 %
EOS (ABSOLUTE): 0.2 10*3/uL (ref 0.0–0.4)
Eos: 3 %
Hematocrit: 39.1 % (ref 34.0–46.6)
Hemoglobin: 13.1 g/dL (ref 11.1–15.9)
Immature Grans (Abs): 0 10*3/uL (ref 0.0–0.1)
Immature Granulocytes: 0 %
Lymphocytes Absolute: 2 10*3/uL (ref 0.7–3.1)
Lymphs: 30 %
MCH: 30 pg (ref 26.6–33.0)
MCHC: 33.5 g/dL (ref 31.5–35.7)
MCV: 90 fL (ref 79–97)
Monocytes Absolute: 0.4 10*3/uL (ref 0.1–0.9)
Monocytes: 6 %
Neutrophils Absolute: 3.9 10*3/uL (ref 1.4–7.0)
Neutrophils: 60 %
Platelets: 327 10*3/uL (ref 150–450)
RBC: 4.36 x10E6/uL (ref 3.77–5.28)
RDW: 12 % (ref 11.7–15.4)
WBC: 6.6 10*3/uL (ref 3.4–10.8)

## 2021-10-19 LAB — CMP14+EGFR
ALT: 12 IU/L (ref 0–32)
AST: 13 IU/L (ref 0–40)
Albumin/Globulin Ratio: 2.3 — ABNORMAL HIGH (ref 1.2–2.2)
Albumin: 4.6 g/dL (ref 3.8–4.8)
Alkaline Phosphatase: 62 IU/L (ref 44–121)
BUN/Creatinine Ratio: 26 (ref 12–28)
BUN: 34 mg/dL — ABNORMAL HIGH (ref 8–27)
Bilirubin Total: 0.3 mg/dL (ref 0.0–1.2)
CO2: 22 mmol/L (ref 20–29)
Calcium: 10.3 mg/dL (ref 8.7–10.3)
Chloride: 102 mmol/L (ref 96–106)
Creatinine, Ser: 1.29 mg/dL — ABNORMAL HIGH (ref 0.57–1.00)
Globulin, Total: 2 g/dL (ref 1.5–4.5)
Glucose: 107 mg/dL — ABNORMAL HIGH (ref 70–99)
Potassium: 4.7 mmol/L (ref 3.5–5.2)
Sodium: 139 mmol/L (ref 134–144)
Total Protein: 6.6 g/dL (ref 6.0–8.5)
eGFR: 45 mL/min/{1.73_m2} — ABNORMAL LOW (ref >59)

## 2021-10-19 LAB — MICROALBUMIN / CREATININE URINE RATIO
Creatinine, Urine: 118 mg/dL
Microalb/Creat Ratio: 79 mg/g creat — ABNORMAL HIGH (ref 0–29)
Microalbumin, Urine: 93.7 ug/mL

## 2021-11-27 ENCOUNTER — Other Ambulatory Visit: Payer: Self-pay | Admitting: Family

## 2021-11-27 DIAGNOSIS — E1159 Type 2 diabetes mellitus with other circulatory complications: Secondary | ICD-10-CM

## 2021-12-07 ENCOUNTER — Other Ambulatory Visit: Payer: Self-pay | Admitting: Family

## 2021-12-07 DIAGNOSIS — E1169 Type 2 diabetes mellitus with other specified complication: Secondary | ICD-10-CM

## 2022-01-02 ENCOUNTER — Other Ambulatory Visit: Payer: Self-pay | Admitting: Family

## 2022-01-02 DIAGNOSIS — E1159 Type 2 diabetes mellitus with other circulatory complications: Secondary | ICD-10-CM

## 2022-01-17 ENCOUNTER — Other Ambulatory Visit: Payer: Self-pay | Admitting: Family

## 2022-01-17 DIAGNOSIS — E1169 Type 2 diabetes mellitus with other specified complication: Secondary | ICD-10-CM

## 2022-02-14 ENCOUNTER — Other Ambulatory Visit: Payer: Self-pay | Admitting: Family

## 2022-02-15 ENCOUNTER — Other Ambulatory Visit: Payer: Self-pay | Admitting: Family

## 2022-02-16 ENCOUNTER — Telehealth: Payer: Self-pay | Admitting: Family Medicine

## 2022-02-16 NOTE — Telephone Encounter (Signed)
Sent to plan today for Onetouch verio strips

## 2022-02-17 NOTE — Telephone Encounter (Signed)
Approved.  

## 2022-02-24 ENCOUNTER — Other Ambulatory Visit: Payer: Self-pay | Admitting: Family

## 2022-03-04 ENCOUNTER — Other Ambulatory Visit: Payer: Self-pay | Admitting: Family

## 2022-03-04 DIAGNOSIS — E1169 Type 2 diabetes mellitus with other specified complication: Secondary | ICD-10-CM

## 2022-03-26 ENCOUNTER — Encounter (HOSPITAL_COMMUNITY): Payer: Self-pay | Admitting: Emergency Medicine

## 2022-03-26 ENCOUNTER — Emergency Department (HOSPITAL_COMMUNITY): Payer: Medicare HMO

## 2022-03-26 ENCOUNTER — Observation Stay (HOSPITAL_COMMUNITY): Payer: Medicare HMO

## 2022-03-26 ENCOUNTER — Observation Stay (HOSPITAL_BASED_OUTPATIENT_CLINIC_OR_DEPARTMENT_OTHER): Payer: Medicare HMO

## 2022-03-26 ENCOUNTER — Observation Stay (HOSPITAL_COMMUNITY)
Admission: EM | Admit: 2022-03-26 | Discharge: 2022-03-26 | Disposition: A | Discharge status: Home / Self Care | Payer: Medicare HMO | Attending: Internal Medicine | Admitting: Internal Medicine

## 2022-03-26 ENCOUNTER — Other Ambulatory Visit: Payer: Self-pay

## 2022-03-26 DIAGNOSIS — Z8673 Personal history of transient ischemic attack (TIA), and cerebral infarction without residual deficits: Secondary | ICD-10-CM | POA: Diagnosis not present

## 2022-03-26 DIAGNOSIS — N189 Chronic kidney disease, unspecified: Secondary | ICD-10-CM | POA: Diagnosis not present

## 2022-03-26 DIAGNOSIS — U071 COVID-19: Secondary | ICD-10-CM | POA: Insufficient documentation

## 2022-03-26 DIAGNOSIS — I350 Nonrheumatic aortic (valve) stenosis: Secondary | ICD-10-CM

## 2022-03-26 DIAGNOSIS — I951 Orthostatic hypotension: Secondary | ICD-10-CM | POA: Diagnosis present

## 2022-03-26 DIAGNOSIS — I129 Hypertensive chronic kidney disease with stage 1 through stage 4 chronic kidney disease, or unspecified chronic kidney disease: Secondary | ICD-10-CM | POA: Insufficient documentation

## 2022-03-26 DIAGNOSIS — E785 Hyperlipidemia, unspecified: Secondary | ICD-10-CM | POA: Diagnosis not present

## 2022-03-26 DIAGNOSIS — E1169 Type 2 diabetes mellitus with other specified complication: Secondary | ICD-10-CM | POA: Diagnosis not present

## 2022-03-26 DIAGNOSIS — R42 Dizziness and giddiness: Secondary | ICD-10-CM | POA: Diagnosis not present

## 2022-03-26 DIAGNOSIS — Z7982 Long term (current) use of aspirin: Secondary | ICD-10-CM | POA: Insufficient documentation

## 2022-03-26 DIAGNOSIS — Z7984 Long term (current) use of oral hypoglycemic drugs: Secondary | ICD-10-CM | POA: Insufficient documentation

## 2022-03-26 DIAGNOSIS — Z136 Encounter for screening for cardiovascular disorders: Secondary | ICD-10-CM | POA: Diagnosis not present

## 2022-03-26 DIAGNOSIS — R8281 Pyuria: Secondary | ICD-10-CM | POA: Diagnosis not present

## 2022-03-26 DIAGNOSIS — J449 Chronic obstructive pulmonary disease, unspecified: Secondary | ICD-10-CM | POA: Diagnosis not present

## 2022-03-26 DIAGNOSIS — R69 Illness, unspecified: Secondary | ICD-10-CM | POA: Diagnosis not present

## 2022-03-26 DIAGNOSIS — Z79899 Other long term (current) drug therapy: Secondary | ICD-10-CM | POA: Insufficient documentation

## 2022-03-26 DIAGNOSIS — I701 Atherosclerosis of renal artery: Secondary | ICD-10-CM | POA: Diagnosis present

## 2022-03-26 DIAGNOSIS — E1122 Type 2 diabetes mellitus with diabetic chronic kidney disease: Secondary | ICD-10-CM | POA: Insufficient documentation

## 2022-03-26 DIAGNOSIS — R059 Cough, unspecified: Secondary | ICD-10-CM | POA: Diagnosis not present

## 2022-03-26 DIAGNOSIS — F17210 Nicotine dependence, cigarettes, uncomplicated: Secondary | ICD-10-CM | POA: Diagnosis not present

## 2022-03-26 DIAGNOSIS — R0609 Other forms of dyspnea: Secondary | ICD-10-CM

## 2022-03-26 DIAGNOSIS — J4489 Other specified chronic obstructive pulmonary disease: Secondary | ICD-10-CM

## 2022-03-26 LAB — URINALYSIS, ROUTINE W REFLEX MICROSCOPIC
Bilirubin Urine: NEGATIVE
Glucose, UA: NEGATIVE mg/dL
Ketones, ur: NEGATIVE mg/dL
Nitrite: NEGATIVE
Protein, ur: NEGATIVE mg/dL
Specific Gravity, Urine: 1.01 (ref 1.005–1.030)
pH: 5 (ref 5.0–8.0)

## 2022-03-26 LAB — CBC WITH DIFFERENTIAL/PLATELET
Abs Immature Granulocytes: 0 10*3/uL (ref 0.00–0.07)
Basophils Absolute: 0 10*3/uL (ref 0.0–0.1)
Basophils Relative: 1 %
Eosinophils Absolute: 0.3 10*3/uL (ref 0.0–0.5)
Eosinophils Relative: 7 %
HCT: 38.9 % (ref 36.0–46.0)
Hemoglobin: 12.9 g/dL (ref 12.0–15.0)
Immature Granulocytes: 0 %
Lymphocytes Relative: 34 %
Lymphs Abs: 1.4 10*3/uL (ref 0.7–4.0)
MCH: 29.9 pg (ref 26.0–34.0)
MCHC: 33.2 g/dL (ref 30.0–36.0)
MCV: 90.3 fL (ref 80.0–100.0)
Monocytes Absolute: 0.5 10*3/uL (ref 0.1–1.0)
Monocytes Relative: 11 %
Neutro Abs: 2.1 10*3/uL (ref 1.7–7.7)
Neutrophils Relative %: 47 %
Platelets: 315 10*3/uL (ref 150–400)
RBC: 4.31 MIL/uL (ref 3.87–5.11)
RDW: 12.4 % (ref 11.5–15.5)
WBC: 4.3 10*3/uL (ref 4.0–10.5)
nRBC: 0 % (ref 0.0–0.2)

## 2022-03-26 LAB — COMPREHENSIVE METABOLIC PANEL
ALT: 16 U/L (ref 0–44)
AST: 22 U/L (ref 15–41)
Albumin: 4 g/dL (ref 3.5–5.0)
Alkaline Phosphatase: 51 U/L (ref 38–126)
Anion gap: 11 (ref 5–15)
BUN: 60 mg/dL — ABNORMAL HIGH (ref 8–23)
CO2: 20 mmol/L — ABNORMAL LOW (ref 22–32)
Calcium: 9.8 mg/dL (ref 8.9–10.3)
Chloride: 105 mmol/L (ref 98–111)
Creatinine, Ser: 1.52 mg/dL — ABNORMAL HIGH (ref 0.44–1.00)
GFR, Estimated: 37 mL/min — ABNORMAL LOW (ref >60)
Glucose, Bld: 99 mg/dL (ref 70–99)
Potassium: 4.2 mmol/L (ref 3.5–5.1)
Sodium: 136 mmol/L (ref 135–145)
Total Bilirubin: 0.4 mg/dL (ref 0.3–1.2)
Total Protein: 7.6 g/dL (ref 6.5–8.1)

## 2022-03-26 LAB — LACTIC ACID, PLASMA
Lactic Acid, Venous: 0.8 mmol/L (ref 0.5–1.9)
Lactic Acid, Venous: 1.2 mmol/L (ref 0.5–1.9)

## 2022-03-26 LAB — T4, FREE: Free T4: 1.05 ng/dL (ref 0.61–1.12)

## 2022-03-26 LAB — ECHOCARDIOGRAM COMPLETE
AR max vel: 1.31 cm2
AV Area VTI: 1.36 cm2
AV Area mean vel: 1.25 cm2
AV Mean grad: 14.5 mmHg
AV Peak grad: 26 mmHg
Ao pk vel: 2.55 m/s
Area-P 1/2: 2.62 cm2
Calc EF: 65.1 %
Height: 68 in
P 1/2 time: 281 msec
S' Lateral: 3.1 cm
Single Plane A2C EF: 59.6 %
Single Plane A4C EF: 70.5 %
Weight: 2608 oz

## 2022-03-26 LAB — PROCALCITONIN: Procalcitonin: 0.48 ng/mL

## 2022-03-26 LAB — VITAMIN B12: Vitamin B-12: 496 pg/mL (ref 180–914)

## 2022-03-26 LAB — TROPONIN I (HIGH SENSITIVITY)
Troponin I (High Sensitivity): 6 ng/L (ref <18)
Troponin I (High Sensitivity): 5 ng/L (ref <18)

## 2022-03-26 LAB — CBG MONITORING, ED: Glucose-Capillary: 100 mg/dL — ABNORMAL HIGH (ref 70–99)

## 2022-03-26 LAB — FOLATE: Folate: 12.5 ng/mL (ref >5.9)

## 2022-03-26 LAB — CK: Total CK: 221 U/L (ref 38–234)

## 2022-03-26 LAB — SARS CORONAVIRUS 2 BY RT PCR: SARS Coronavirus 2 by RT PCR: POSITIVE — AB

## 2022-03-26 LAB — HIV ANTIBODY (ROUTINE TESTING W REFLEX): HIV Screen 4th Generation wRfx: NONREACTIVE

## 2022-03-26 LAB — TSH: TSH: 0.892 u[IU]/mL (ref 0.350–4.500)

## 2022-03-26 MED ORDER — SODIUM CHLORIDE 0.9 % IV SOLN: INTRAVENOUS | Status: DC

## 2022-03-26 MED ORDER — NIRMATRELVIR/RITONAVIR (PAXLOVID) TABLET (RENAL DOSING): 2.0000 | ORAL_TABLET | Freq: Two times a day (BID) | ORAL | 0 refills | Status: AC

## 2022-03-26 MED ORDER — BUDESONIDE 0.5 MG/2ML IN SUSP
0.5000 mg | Freq: Two times a day (BID) | RESPIRATORY_TRACT | Status: DC
  Administered 2022-03-26: 0.5 mg via RESPIRATORY_TRACT
  Filled 2022-03-26: qty 2

## 2022-03-26 MED ORDER — NIRMATRELVIR/RITONAVIR (PAXLOVID)TABLET: 3.0000 | ORAL_TABLET | Freq: Two times a day (BID) | ORAL | Status: DC

## 2022-03-26 MED ORDER — ONDANSETRON HCL 4 MG PO TABS: 4.0000 mg | ORAL_TABLET | Freq: Four times a day (QID) | ORAL | Status: DC | PRN

## 2022-03-26 MED ORDER — ASPIRIN 81 MG PO TBEC
81.0000 mg | DELAYED_RELEASE_TABLET | Freq: Every morning | ORAL | Status: DC
  Administered 2022-03-26: 81 mg via ORAL
  Filled 2022-03-26: qty 1

## 2022-03-26 MED ORDER — NIRMATRELVIR/RITONAVIR (PAXLOVID) TABLET (RENAL DOSING)
2.0000 | ORAL_TABLET | Freq: Two times a day (BID) | ORAL | Status: DC
  Filled 2022-03-26: qty 20

## 2022-03-26 MED ORDER — ARFORMOTEROL TARTRATE 15 MCG/2ML IN NEBU
15.0000 ug | INHALATION_SOLUTION | Freq: Two times a day (BID) | RESPIRATORY_TRACT | Status: DC
  Administered 2022-03-26: 15 ug via RESPIRATORY_TRACT
  Filled 2022-03-26: qty 2

## 2022-03-26 MED ORDER — ACETAMINOPHEN 650 MG RE SUPP: 650.0000 mg | Freq: Four times a day (QID) | RECTAL | Status: DC | PRN

## 2022-03-26 MED ORDER — FLUTICASONE-SALMETEROL 45-21 MCG/ACT IN AERO: 2.0000 | INHALATION_SPRAY | Freq: Two times a day (BID) | RESPIRATORY_TRACT | 12 refills | Status: DC

## 2022-03-26 MED ORDER — IPRATROPIUM-ALBUTEROL 0.5-2.5 (3) MG/3ML IN SOLN
3.0000 mL | Freq: Four times a day (QID) | RESPIRATORY_TRACT | Status: DC
  Administered 2022-03-26: 3 mL via RESPIRATORY_TRACT
  Filled 2022-03-26: qty 3

## 2022-03-26 MED ORDER — ATORVASTATIN CALCIUM 40 MG PO TABS: 40.0000 mg | ORAL_TABLET | Freq: Every morning | ORAL | 0 refills | Status: DC

## 2022-03-26 MED ORDER — ATORVASTATIN CALCIUM 40 MG PO TABS: 40.0000 mg | ORAL_TABLET | Freq: Every morning | ORAL | Status: DC

## 2022-03-26 MED ORDER — GLIPIZIDE 10 MG PO TABS: 10.0000 mg | ORAL_TABLET | Freq: Every day | ORAL | 0 refills | Status: DC

## 2022-03-26 MED ORDER — ATORVASTATIN CALCIUM 40 MG PO TABS
40.0000 mg | ORAL_TABLET | Freq: Every morning | ORAL | Status: DC
  Administered 2022-03-26: 40 mg via ORAL
  Filled 2022-03-26: qty 1

## 2022-03-26 MED ORDER — ALBUTEROL SULFATE HFA 108 (90 BASE) MCG/ACT IN AERS
2.0000 | INHALATION_SPRAY | Freq: Once | RESPIRATORY_TRACT | Status: AC
  Administered 2022-03-26: 2 via RESPIRATORY_TRACT
  Filled 2022-03-26: qty 6.7

## 2022-03-26 MED ORDER — ONDANSETRON HCL 4 MG/2ML IJ SOLN: 4.0000 mg | Freq: Four times a day (QID) | INTRAMUSCULAR | Status: DC | PRN

## 2022-03-26 MED ORDER — ACETAMINOPHEN 325 MG PO TABS: 650.0000 mg | ORAL_TABLET | Freq: Four times a day (QID) | ORAL | Status: DC | PRN

## 2022-03-26 MED ORDER — ENOXAPARIN SODIUM 40 MG/0.4ML IJ SOSY
40.0000 mg | PREFILLED_SYRINGE | INTRAMUSCULAR | Status: DC
  Administered 2022-03-26: 40 mg via SUBCUTANEOUS
  Filled 2022-03-26: qty 0.4

## 2022-03-26 NOTE — Discharge Summary (Signed)
Physician Discharge Summary   Patient: Amanda Booker MRN: [id number] DOB: [birth year]  Admit date:     03/26/2022  Discharge date: 03/26/22  Discharge Physician: Shanon Brow Rifka Ramey   PCP: Sharion Balloon, FNP   Recommendations at discharge:   Please follow up with primary care provider within 1-2 weeks  Please repeat BMP and CBC in one week   Hospital Course: 71 year old female with history of bilateral renal artery stenosis, hypertension, diabetes mellitus type 2, hyperlipidemia, COPD, tobacco abuse, stroke with left hemiparesis presenting with generalized weakness and dizziness that began on the morning of 03/25/22.  History is supplemented by the patient's daughter at the bedside who is also a Marine scientist.  She states that the patient's systolic blood pressure has been running in the 120-130 range which is low in comparison to the patient's normal baseline with systolic blood pressure 017-510.  The patient has been feeling dizzy.  Daughter checked orthostatics on 03/25/2022 and 03/26/2022, and the patient systolic blood pressure dropped into the 80s with standing.  Daughter also relates that the patient has not been eating as well as usual.  The patient denies any fevers, chills, headache, chest pain, nausea, vomiting, diarrhea, abdominal pain, hematochezia, melena.  The patient has had a slightly worsening cough and slight worse dyspnea on exertion.  She continues to smoke 1/2 pack/day.  There is no hemoptysis.  Notably, the patient has had some low normal and low blood sugars in the mornings, but rechecked them and they improve into the low 100s after eating. The patient denies any focal extremity weakness, sensory disturbance, slurred speech, or visual disturbance.  She denies any new medications. In the ED, the patient was afebrile and hemodynamically stable with oxygen saturation 95% on room air.  The patient was orthostatic.  WBC 4.3, hemoglobin 12.9, platelets 215,000.  Sodium 136, potassium 4.2,  bicarbonate 20, serum creatinine 1.52.  LFTs were unremarkable.  Troponin 6.  EKG shows sinus rhythm with no STT changes.  The patient was admitted for further evaluation and treatment of her dizziness and orthostasis.    I updated patient's daughter about patient's positive COVID test which may explain much of patient's dizziness. As day has progress, BP has gradually improved with IVF with now SBP 150-160 range. Daughter expressed concern that pt will not be willing to stay in hospital tonight and may cause some emotional issues Daughter is a nurse and will stay with patient at home for the next week She understands to hold BP meds for another 24 hours and monitor BPs at home and to push po fluids She know to gradually re-intiate BP meds depending on BP checks, starting with metoprolol. Rx for Paxlovid sent. Due to drug interaction, instructed pt/daughter to hold advair till 12/9 Given medical stability and safe home environment, daughter wishes for d/c home.     Assessment and Plan: Orthostatic hypotension/dizziness -Start IV fluids -B12--496 -TSH--0.892 -PT eval -Check lactic acid--0.8>>1.2 -Echo-not done as pt wishes for d/c -Certainly the patient may have a component of dysautonomia from her diabetes mellitus -check PCT--0.48  COVID-19 positive -stable on RA -likely explains dizziness in addition to her volume depleion and orthostasis -d/c home with paxlovid  -hold Advair and statin x 5 days due to drug interaction   Pyuria -Obtain urine culture -d/c home with cefdinir x 3 days   Aortic stenosis -11/13/2019 echo EF 60 to 65%, no WMA, grade 1 DD, mild-mod AS -Repeat echo -Avoid hypotension   Controlled diabetes mellitus type 2 -Holding  glipizide and metformin during hospitalization -restart glipizide once daily in am due to recurrent am hypoglycemia -The patient has been having some morning lows -I have instructed patient to stop taking her evening dose of  glipizide -check A1C--pending   Secondary hypertension -The patient has established renal artery stenosis -Allow for permissive hypertension -Holding clonidine, lisinopril, HCTZ, spironolactone, amlodipine, metoprolol -Her BP was as low as 101/89 in the ED while sitting in the gurney   COPD -Stable on room air -Started DuoNebs -Started Pulmicort -started brovana   Tobacco abuse -Tobacco cessation discussed   Mixed hyperlipidemia -Continue statin          Consultants: none Procedures performed: none  Disposition: Home Diet recommendation:  Carb modified diet DISCHARGE MEDICATION: Allergies as of 03/26/2022   No Known Allergies      Medication List     TAKE these medications    albuterol 108 (90 Base) MCG/ACT inhaler Commonly known as: VENTOLIN HFA Inhale 2 puffs into the lungs every 6 (six) hours as needed for wheezing or shortness of breath.   albuterol (2.5 MG/3ML) 0.083% nebulizer solution Commonly known as: PROVENTIL Take 3 mLs (2.5 mg total) by nebulization every 4 (four) hours as needed for wheezing or shortness of breath.   amLODipine 10 MG tablet Commonly known as: NORVASC TAKE 1 TABLET BY MOUTH ONCE DAILY IN THE MORNING   aspirin 81 MG tablet Take 81 mg by mouth every morning.   atorvastatin 40 MG tablet Commonly known as: LIPITOR Take 1 tablet (40 mg total) by mouth every morning. Start taking on: April 01, 2022 What changed: These instructions start on April 01, 2022. If you are unsure what to do until then, ask your doctor or other care provider.   CENTRUM SILVER PO Take 1 tablet by mouth daily.   cloNIDine 0.3 MG tablet Commonly known as: Catapres Take 1 tablet (0.3 mg total) by mouth 2 (two) times daily.   fluticasone-salmeterol 45-21 MCG/ACT inhaler Commonly known as: ADVAIR HFA Inhale 2 puffs into the lungs 2 (two) times daily. Do not restart taking until 04/01/22 Start taking on: April 01, 2022 What changed:  additional  instructions These instructions start on April 01, 2022. If you are unsure what to do until then, ask your doctor or other care provider.   glipiZIDE 10 MG tablet Commonly known as: GLUCOTROL Take 1 tablet (10 mg total) by mouth daily before breakfast. What changed: when to take this   hydrochlorothiazide 25 MG tablet Commonly known as: HYDRODIURIL TAKE 1 TABLET BY MOUTH ONCE DAILY IN THE MORNING   lisinopril 40 MG tablet Commonly known as: ZESTRIL Take 1 tablet (40 mg total) by mouth daily.   metFORMIN 1000 MG tablet Commonly known as: Glucophage Take 1 tablet (1,000 mg total) by mouth 2 (two) times daily with a meal.   metoprolol succinate 50 MG 24 hr tablet Commonly known as: TOPROL-XL Take 2 tablets (100 mg total) by mouth daily. Take with or immediately following a meal. What changed:  how much to take when to take this   nirmatrelvir/ritonavir EUA (renal dosing) 10 x 150 MG & 10 x 100MG Tabs Commonly known as: PAXLOVID Take 2 tablets by mouth 2 (two) times daily for 5 days. Patient GFR is 40. Take nirmatrelvir (150 mg) one tablet twice daily for 5 days and ritonavir (100 mg) one tablet twice daily for 5 days.   OneTouch Delica Plus [id number] Misc 4 (four) times daily. use as directed   [employer]  System w/Device Kit USE AS DIRECTED 4 TIMES DAILY Dx E11.69   OneTouch Verio test strip Generic drug: glucose blood USE AS DIRECTED 4 TIMES DAILY Dx E11.69   spironolactone 25 MG tablet Commonly known as: ALDACTONE Take 1 tablet (25 mg total) by mouth daily.        Discharge Exam: Filed Weights   03/26/22 0701  Weight: 73.9 kg   HEENT:  [place]/AT, No thrush, no icterus CV:  RRR, no rub, no S3, no S4 Lung:  bibasilar rales.  No wheeze Abd:  soft/+BS, NT Ext:  No edema, no lymphangitis, no synovitis, no rash   Condition at discharge: stable  The results of significant diagnostics from this hospitalization (including imaging, microbiology, ancillary  and laboratory) are listed below for reference.   Imaging Studies: ECHOCARDIOGRAM COMPLETE  Result Date: 03/26/2022    ECHOCARDIOGRAM REPORT   Patient Name:   Mariadelcarmen MAE Garber Date of Exam: 03/26/2022 Medical Rec #:  [id number]        Height:       68.0 in Accession #:    [id number]       Weight:       163.0 lb Date of Birth:  02-14-1951        BSA:          1.874 m Patient Age:    3 years         BP:           137/117 mmHg Patient Gender: F                HR:           76 bpm. Exam Location:  [facility] Procedure: 2D Echo, Cardiac Doppler and Color Doppler Indications:    Dyspnea R06.00                 Aortic stenosis I35.0  History:        Patient has prior history of Echocardiogram examinations, most                 recent 11/13/2019. COPD, Aortic Valve Disease,                 Signs/Symptoms:Dyspnea; Risk Factors:Hypertension and Diabetes.  Sonographer:    Bernadene Person RDCS Referring Phys: [phone] Libbie Bartley IMPRESSIONS  1. Left ventricular ejection fraction, by estimation, is 60 to 65%. The left ventricle has normal function. The left ventricle has no regional wall motion abnormalities. Left ventricular diastolic parameters are indeterminate.  2. Right ventricular systolic function is normal. The right ventricular size is normal. There is normal pulmonary artery systolic pressure.  3. The mitral valve is normal in structure. Trivial mitral valve regurgitation.  4. AV is thickened, calcified with restricted motion Peak and mean gradients through the valve are 26 and 16 mm Hg respectively AVA is 1.34 cm2 . Dimensionles index is 0.43 consistent with moderate AS. COmpared to echo from 2021, gradient is mildly increased (13 to 16 mm Hg) . The aortic valve is abnormal. Aortic valve regurgitation is trivial.  5. The inferior vena cava is normal in size with greater than 50% respiratory variability, suggesting right atrial pressure of 3 mmHg. FINDINGS  Left Ventricle: Left ventricular ejection fraction, by estimation,  is 60 to 65%. The left ventricle has normal function. The left ventricle has no regional wall motion abnormalities. The left ventricular internal cavity size was normal in size. There is  no left ventricular hypertrophy. Left ventricular diastolic parameters  are indeterminate. Right Ventricle: The right ventricular size is normal. Right vetricular wall thickness was not assessed. Right ventricular systolic function is normal. There is normal pulmonary artery systolic pressure. The tricuspid regurgitant velocity is 1.29 m/s, and with an assumed right atrial pressure of 3 mmHg, the estimated right ventricular systolic pressure is 9.7 mmHg. Left Atrium: Left atrial size was normal in size. Right Atrium: Right atrial size was normal in size. Pericardium: There is no evidence of pericardial effusion. Mitral Valve: The mitral valve is normal in structure. There is mild thickening of the mitral valve leaflet(s). Mild mitral annular calcification. Trivial mitral valve regurgitation. Tricuspid Valve: The tricuspid valve is normal in structure. Tricuspid valve regurgitation is trivial. Aortic Valve: AV is thickened, calcified with restricted motion Peak and mean gradients through the valve are 26 and 16 mm Hg respectively AVA is 1.34 cm2 . Dimensionles index is 0.43 consistent with moderate AS. COmpared to echo from 2021, gradient is mildly increased (13 to 16 mm Hg). The aortic valve is abnormal. Aortic valve regurgitation is trivial. Aortic regurgitation PHT measures 281 msec. Aortic valve mean gradient measures 14.5 mmHg. Aortic valve peak gradient measures 26.0 mmHg. Aortic valve  area, by VTI measures 1.36 cm. Pulmonic Valve: The pulmonic valve was not well visualized. Pulmonic valve regurgitation is not visualized. Aorta: The aortic root and ascending aorta are structurally normal, with no evidence of dilitation. Venous: The inferior vena cava is normal in size with greater than 50% respiratory variability, suggesting  right atrial pressure of 3 mmHg. IAS/Shunts: No atrial level shunt detected by color flow Doppler.  LEFT VENTRICLE PLAX 2D LVIDd:         4.20 cm     Diastology LVIDs:         3.10 cm     LV e' medial:    7.22 cm/s LV PW:         0.80 cm     LV E/e' medial:  11.1 LV IVS:        0.80 cm     LV e' lateral:   8.43 cm/s LVOT diam:     2.00 cm     LV E/e' lateral: 9.5 LV SV:         71 LV SV Index:   38 LVOT Area:     3.14 cm  LV Volumes (MOD) LV vol d, MOD A2C: 58.9 ml LV vol d, MOD A4C: 41.7 ml LV vol s, MOD A2C: 23.8 ml LV vol s, MOD A4C: 12.3 ml LV SV MOD A2C:     35.1 ml LV SV MOD A4C:     41.7 ml LV SV MOD BP:      33.0 ml RIGHT VENTRICLE RV S prime:     11.50 cm/s TAPSE (M-mode): 2.3 cm LEFT ATRIUM           Index        RIGHT ATRIUM           Index LA diam:      2.40 cm 1.28 cm/m   RA Area:     10.80 cm LA Vol (A4C): 25.2 ml 13.45 ml/m  RA Volume:   22.80 ml  12.17 ml/m  AORTIC VALVE AV Area (Vmax):    1.31 cm AV Area (Vmean):   1.25 cm AV Area (VTI):     1.36 cm AV Vmax:           255.00 cm/s AV Vmean:  179.000 cm/s AV VTI:            0.519 m AV Peak Grad:      26.0 mmHg AV Mean Grad:      14.5 mmHg LVOT Vmax:         106.00 cm/s LVOT Vmean:        71.100 cm/s LVOT VTI:          0.226 m LVOT/AV VTI ratio: 0.43 AI PHT:            281 msec  AORTA Ao Root diam: 3.20 cm Ao Asc diam:  3.00 cm MITRAL VALVE                TRICUSPID VALVE MV Area (PHT): 2.62 cm     TR Peak grad:   6.7 mmHg MV Decel Time: 289 msec     TR Vmax:        129.00 cm/s MV E velocity: 79.90 cm/s MV A velocity: 125.00 cm/s  SHUNTS MV E/A ratio:  0.64         Systemic VTI:  0.23 m                             Systemic Diam: 2.00 cm Dorris Carnes MD Electronically signed by Dorris Carnes MD Signature Date/Time: 03/26/2022/2:16:17 PM    Final    DG Chest 2 View  Result Date: 03/26/2022 CLINICAL DATA:  COPD.  Cough. EXAM: CHEST - 2 VIEW COMPARISON:  03/27/2013 FINDINGS: Lungs are adequately inflated without focal airspace consolidation  or effusion. Cardiomediastinal silhouette is normal. VP shunt tubing unchanged. Remainder of the exam is unchanged. IMPRESSION: No active cardiopulmonary disease. Electronically Signed   By: Marin Olp M.D.   On: 03/26/2022 11:03   US Aorta  Result Date: 03/26/2022 CLINICAL DATA:  Unexplained low blood pressure. EXAM: ULTRASOUND OF ABDOMINAL AORTA TECHNIQUE: Ultrasound examination of the abdominal aorta and proximal common iliac arteries was performed to evaluate for aneurysm. Additional color and Doppler images of the distal aorta were obtained to document patency. COMPARISON:  None Available. FINDINGS: Abdominal aortic measurements as follows: Proximal:  2.3 AP/2 transverse cm Mid:  1.5 AP/1.5 transverse cm Distal:  1 AP/0.8 transverse cm Patent: Yes, peak systolic velocity is 79 cm/s Right common iliac artery: 0.7 AP/0.9 transverse cm Left common iliac artery: 0.7 AP/1 transverse cm IMPRESSION: No evidence of abdominal aortic aneurysm. No follow-up recommended. This recommendation follows ACR consensus guidelines: White Paper of the ACR Incidental Findings Committee II on Vascular Findings. J Am Coll Radiol 2013; 10:789-794. Electronically Signed   By: Abelardo Diesel M.D.   On: 03/26/2022 09:56    Microbiology: Results for orders placed or performed during the hospital encounter of 03/26/22  SARS Coronavirus 2 by RT PCR (hospital order, performed in [facility] hospital lab) *cepheid single result test* Urine, Clean Catch     Status: Abnormal   Collection Time: 03/26/22 11:30 AM   Specimen: Urine, Clean Catch; Nasal Swab  Result Value Ref Range Status   SARS Coronavirus 2 by RT PCR POSITIVE (A) NEGATIVE Final    Comment: (NOTE) SARS-CoV-2 target nucleic acids are DETECTED  SARS-CoV-2 RNA is generally detectable in upper respiratory specimens  during the acute phase of infection.  Positive results are indicative  of the presence of the identified virus, but do not rule out bacterial infection  or co-infection with other pathogens not detected by the test.  Clinical correlation  with patient history and  other diagnostic information is necessary to determine patient infection status.  The expected result is negative.  Fact Sheet for Patients:   https://www.patel.info/   Fact Sheet for Healthcare Providers:   https://hall.com/    This test is not yet approved or cleared by the [place] FDA and  has been authorized for detection and/or diagnosis of SARS-CoV-2 by FDA under an Emergency Use Authorization (EUA).  This EUA will remain in effect (meaning this test can be used) for the duration of  the COVID-19 declaration under Section 564(b)(1)  of the Act, 21 U.S.C. section 360-bbb-3(b)(1), unless the authorization is terminated or revoked sooner.   Performed at [facility], [address] [place], [place] [address]     Labs: CBC: Recent Labs  Lab 03/26/22 0728  WBC 4.3  NEUTROABS 2.1  HGB 12.9  HCT 38.9  MCV 90.3  PLT 224   Basic Metabolic Panel: Recent Labs  Lab 03/26/22 0728  NA 136  K 4.2  CL 105  CO2 20*  GLUCOSE 99  BUN 60*  CREATININE 1.52*  CALCIUM 9.8   Liver Function Tests: Recent Labs  Lab 03/26/22 0728  AST 22  ALT 16  ALKPHOS 51  BILITOT 0.4  PROT 7.6  ALBUMIN 4.0   CBG: Recent Labs  Lab 03/26/22 0809  GLUCAP 100*    Discharge time spent: greater than 30 minutes.  Signed: Orson Eva, MD [facility] 03/26/2022

## 2022-03-26 NOTE — ED Notes (Signed)
Patient was found by security smoking in her personal vehicle with family. Patient was informed when coming back to her room that smoking was against hospital policy and that she could ask for a nicotine patch but that she would not be able to go outside while being admitted to smoke. Patient states "the doctor told me before that I could go smoke." Apologized to the patient and stated that this was false information that it is against hospital policy and that she is not allowed to go out and smoke. Charge nurse notified of situation.

## 2022-03-26 NOTE — Hospital Course (Signed)
71 year old female with history of bilateral renal artery stenosis, hypertension, diabetes mellitus type 2, hyperlipidemia, COPD, tobacco abuse, stroke with left hemiparesis presenting with generalized weakness and dizziness that began on the morning of 03/25/22.  History is supplemented by the patient's daughter at the bedside who is also a Engineer, civil (consulting).  She states that the patient's systolic blood pressure has been running in the 120-130 range which is low in comparison to the patient's normal baseline with systolic blood pressure 150-160.  The patient has been feeling dizzy.  Daughter checked orthostatics on 03/25/2022 and 03/26/2022, and the patient systolic blood pressure dropped into the 80s with standing.  Daughter also relates that the patient has not been eating as well as usual.  The patient denies any fevers, chills, headache, chest pain, nausea, vomiting, diarrhea, abdominal pain, hematochezia, melena.  The patient has had a slightly worsening cough and slight worse dyspnea on exertion.  She continues to smoke 1/2 pack/day.  There is no hemoptysis.  Notably, the patient has had some low normal and low blood sugars in the mornings, but rechecked them and they improve into the low 100s after eating. The patient denies any focal extremity weakness, sensory disturbance, slurred speech, or visual disturbance.  She denies any new medications. In the ED, the patient was afebrile and hemodynamically stable with oxygen saturation 95% on room air.  The patient was orthostatic.  WBC 4.3, hemoglobin 12.9, platelets 215,000.  Sodium 136, potassium 4.2, bicarbonate 20, serum creatinine 1.52.  LFTs were unremarkable.  Troponin 6.  EKG shows sinus rhythm with no STT changes.  The patient was admitted for further evaluation and treatment of her dizziness and orthostasis.

## 2022-03-26 NOTE — Progress Notes (Signed)
Echocardiogram 2D Echocardiogram has been performed.  Augustine Radar 03/26/2022, 12:47 PM

## 2022-03-26 NOTE — ED Provider Notes (Signed)
[facility] EMERGENCY DEPARTMENT Provider Note   CSN: [id number] Arrival date & time: 03/26/22  6433     History  Chief Complaint  Patient presents with   Dizziness    Amanda Booker is a 71 y.o. female.  HPI     71 year old female comes in with chief complaint of dizziness.  Patient has history of COPD, renal artery stenosis, malignant hypertension on multiple antihypertensives and tobacco use disorder.  Patient's daughter provides substantial history.  According to the patient's daughter, patient was feeling unwell yesterday.  She checked her blood sugar which was fine.  She checked her orthostatics, as patient was complaining of dizziness and it was positive.  Patient systolic blood pressure dropping to 80s.  They decided to make sure that patient gets well-hydrated and did not give her any of her 4-5 antihypertensive agents.  This morning, patient continues to feel sluggish.  She continues to have dizziness with standing up or walking -prompting the daughter to bring the patient to the emergency room.  Patient denies any new headache, neck pain, one-sided weakness, numbness, slurred speech or vision change.  Dizziness is not constant.  Dizziness is not described as vertigo, but instead unsteadiness and lightheadedness.  She denies any near fainting spells.  Review of system is also negative for any nausea, vomiting, fevers, chills, abdominal pain, new chest pain or shortness of breath.  Patient has chronic cough.  She denies any body aches or URI symptoms that are new.  Home Medications Prior to Admission medications   Medication Sig Start Date End Date Taking? Authorizing Provider  albuterol (PROVENTIL) (2.5 MG/3ML) 0.083% nebulizer solution Take 3 mLs (2.5 mg total) by nebulization every 4 (four) hours as needed for wheezing or shortness of breath. 04/21/21 04/21/22  Sharion Balloon, FNP  albuterol (VENTOLIN HFA) 108 (90 Base) MCG/ACT inhaler Inhale 2 puffs into the lungs  every 6 (six) hours as needed for wheezing or shortness of breath. 04/19/21   Evelina Dun A, FNP  amLODipine (NORVASC) 10 MG tablet TAKE 1 TABLET BY MOUTH ONCE DAILY IN THE MORNING 01/03/22   Evelina Dun A, FNP  aspirin 81 MG tablet Take 81 mg by mouth every morning.     [provider]  atorvastatin (LIPITOR) 40 MG tablet TAKE 1 TABLET BY MOUTH ONCE DAILY IN THE MORNING 01/17/22   Evelina Dun A, FNP  Blood Glucose Monitoring Suppl (ONETOUCH VERIO FLEX SYSTEM) w/Device KIT USE AS DIRECTED 4 TIMES DAILY Dx E11.69 02/15/22   Evelina Dun A, FNP  cloNIDine (CATAPRES) 0.3 MG tablet Take 1 tablet (0.3 mg total) by mouth 2 (two) times daily. 04/19/21 10/18/21  Evelina Dun A, FNP  fluticasone-salmeterol (ADVAIR HFA) [phone] MCG/ACT inhaler Inhale 2 puffs into the lungs 2 (two) times daily. 04/19/21   Sharion Balloon, FNP  glipiZIDE (GLUCOTROL) 10 MG tablet TAKE 1 TABLET BY MOUTH TWICE DAILY BEFORE A MEAL 03/05/22   Hawks, [place] A, FNP  glucose blood (ONETOUCH VERIO) test strip USE AS DIRECTED 4 TIMES DAILY Dx E11.69 02/15/22   Evelina Dun A, FNP  hydrochlorothiazide (HYDRODIURIL) 25 MG tablet TAKE 1 TABLET BY MOUTH ONCE DAILY IN THE MORNING 11/28/21   Hawks, [place] A, FNP  Lancets (ONETOUCH DELICA PLUS [id number]) MISC 4 (four) times daily. use as directed 04/19/21   [provider]  lisinopril (ZESTRIL) 40 MG tablet Take 1 tablet (40 mg total) by mouth daily. 05/30/21   Sharion Balloon, FNP  metFORMIN (GLUCOPHAGE) 1000 MG tablet Take  1 tablet (1,000 mg total) by mouth 2 (two) times daily with a meal. 04/19/21 10/18/21  Evelina Dun A, FNP  metoprolol succinate (TOPROL-XL) 50 MG 24 hr tablet Take 2 tablets (100 mg total) by mouth daily. Take with or immediately following a meal. 04/19/21 10/18/21  Evelina Dun A, FNP  spironolactone (ALDACTONE) 25 MG tablet Take 1 tablet (25 mg total) by mouth daily. 04/19/21   Sharion Balloon, FNP      Allergies    Patient has no known  allergies.    Review of Systems   Review of Systems  Physical Exam Updated Vital Signs BP (!) 137/117   Pulse 78   Temp 97.9 F (36.6 C) (Oral)   Resp (!) 23   Ht 5' 8" (1.727 m)   Wt 73.9 kg   SpO2 97%   BMI 24.78 kg/m  Physical Exam Vitals and nursing note reviewed.  Constitutional:      General: She is not in acute distress.    Appearance: She is well-developed.  HENT:     Head: Normocephalic and atraumatic.  Eyes:     Extraocular Movements: Extraocular movements intact.     Conjunctiva/sclera: Conjunctivae normal.     Pupils: Pupils are equal, round, and reactive to light.     Comments: No nystagmus  Cardiovascular:     Rate and Rhythm: Normal rate and regular rhythm.     Heart sounds: Normal heart sounds.  Pulmonary:     Effort: Pulmonary effort is normal. No respiratory distress.     Breath sounds: Normal breath sounds.  Abdominal:     General: Bowel sounds are normal. There is no distension.     Palpations: Abdomen is soft.     Tenderness: There is no abdominal tenderness.  Musculoskeletal:     Cervical back: Normal range of motion and neck supple.  Skin:    General: Skin is warm and dry.  Neurological:     Mental Status: She is alert and oriented to person, place, and time.     Cranial Nerves: No cranial nerve deficit.     Sensory: No sensory deficit.     Motor: No weakness.     Coordination: Coordination normal.     ED Results / Procedures / Treatments   Labs (all labs ordered are listed, but only abnormal results are displayed) Labs Reviewed  SARS CORONAVIRUS 2 BY RT PCR - Abnormal; Notable for the following components:      Result Value   SARS Coronavirus 2 by RT PCR POSITIVE (*)    All other components within normal limits  URINALYSIS, ROUTINE W REFLEX MICROSCOPIC - Abnormal; Notable for the following components:   APPearance HAZY (*)    Hgb urine dipstick MODERATE (*)    Leukocytes,Ua MODERATE (*)    Bacteria, UA RARE (*)    All other  components within normal limits  COMPREHENSIVE METABOLIC PANEL - Abnormal; Notable for the following components:   CO2 20 (*)    BUN 60 (*)    Creatinine, Ser 1.52 (*)    GFR, Estimated 37 (*)    All other components within normal limits  CBG MONITORING, ED - Abnormal; Notable for the following components:   Glucose-Capillary 100 (*)    All other components within normal limits  URINE CULTURE  CBC WITH DIFFERENTIAL/PLATELET  LACTIC ACID, PLASMA  LACTIC ACID, PLASMA  PROCALCITONIN  CK  HEMOGLOBIN A1C  VITAMIN B12  TSH  T4, FREE  FOLATE  HIV ANTIBODY (  ROUTINE TESTING W REFLEX)  TROPONIN I (HIGH SENSITIVITY)  TROPONIN I (HIGH SENSITIVITY)    EKG EKG Interpretation  Date/Time:  Sunday March 26 2022 07:14:18 EST Ventricular Rate:  69 PR Interval:  180 QRS Duration: 87 QT Interval:  397 QTC Calculation: 426 R Axis:   52 Text Interpretation: Sinus rhythm No acute changes no recent ekg to compare Confirmed by Varney Biles [phone]) on 03/26/2022 9:21:26 AM  Radiology DG Chest 2 View  Result Date: 03/26/2022 CLINICAL DATA:  COPD.  Cough. EXAM: CHEST - 2 VIEW COMPARISON:  03/27/2013 FINDINGS: Lungs are adequately inflated without focal airspace consolidation or effusion. Cardiomediastinal silhouette is normal. VP shunt tubing unchanged. Remainder of the exam is unchanged. IMPRESSION: No active cardiopulmonary disease. Electronically Signed   By: Marin Olp M.D.   On: 03/26/2022 11:03   US Aorta  Result Date: 03/26/2022 CLINICAL DATA:  Unexplained low blood pressure. EXAM: ULTRASOUND OF ABDOMINAL AORTA TECHNIQUE: Ultrasound examination of the abdominal aorta and proximal common iliac arteries was performed to evaluate for aneurysm. Additional color and Doppler images of the distal aorta were obtained to document patency. COMPARISON:  None Available. FINDINGS: Abdominal aortic measurements as follows: Proximal:  2.3 AP/2 transverse cm Mid:  1.5 AP/1.5 transverse cm Distal:  1  AP/0.8 transverse cm Patent: Yes, peak systolic velocity is 79 cm/s Right common iliac artery: 0.7 AP/0.9 transverse cm Left common iliac artery: 0.7 AP/1 transverse cm IMPRESSION: No evidence of abdominal aortic aneurysm. No follow-up recommended. This recommendation follows ACR consensus guidelines: White Paper of the ACR Incidental Findings Committee II on Vascular Findings. J Am Coll Radiol 2013; 10:789-794. Electronically Signed   By: Abelardo Diesel M.D.   On: 03/26/2022 09:56    Procedures Procedures    Medications Ordered in ED Medications  0.9 %  sodium chloride infusion ( Intravenous New Bag/Given 03/26/22 1221)  aspirin tablet 81 mg (has no administration in time range)  atorvastatin (LIPITOR) tablet 40 mg (has no administration in time range)  enoxaparin (LOVENOX) injection 40 mg (has no administration in time range)  acetaminophen (TYLENOL) tablet 650 mg (has no administration in time range)    Or  acetaminophen (TYLENOL) suppository 650 mg (has no administration in time range)  ondansetron (ZOFRAN) tablet 4 mg (has no administration in time range)    Or  ondansetron (ZOFRAN) injection 4 mg (has no administration in time range)  budesonide (PULMICORT) nebulizer solution 0.5 mg (0.5 mg Nebulization Given 03/26/22 1126)  ipratropium-albuterol (DUONEB) 0.5-2.5 (3) MG/3ML nebulizer solution 3 mL (3 mLs Nebulization Given 03/26/22 1126)  arformoterol (BROVANA) nebulizer solution 15 mcg (15 mcg Nebulization Given 03/26/22 1126)  albuterol (VENTOLIN HFA) 108 (90 Base) MCG/ACT inhaler 2 puff (2 puffs Inhalation Given 03/26/22 1026)    ED Course/ Medical Decision Making/ A&P                           Medical Decision Making Pt comes in with cc of dizziness.  Pt has history of renal artery stenosis and malignant hypertension, cerebral aneurysm and copd, posterior stroke  It appears she has not taken her medications, but her BP is not only lower than her baseline by at least 20 mmHg, but  she is also orthostatic positive.  This pattern is continued despite them not taking the BP medications yesterday, which is highly unusual.  Differential considered includes AAA given the extensive smoking, occult sepsis, severe dehydration, renal failure.  History however not indicative  of any source of infection.  Patient also not having any pleuritic chest pain, shortness of breath, therefore PE considered but less likely.  Plan right now is to get basic labs, ultrasound abdomen and then reassess. We will likely need to admit the patient for her symptoms and the admitting service will have to figure out if we need to switch her antihypertensive medication regimen.  Currently patient is not having any acute neurodeficits and does not appear to have had a stroke.  Problems Addressed: Orthostatic dizziness: acute illness or injury with systemic symptoms  Amount and/or Complexity of Data Reviewed Labs: ordered. Radiology: ordered.  Risk Prescription drug management. Decision regarding hospitalization.    Final Clinical Impression(s) / ED Diagnoses Final diagnoses:  Orthostatic dizziness    Rx / DC Orders ED Discharge Orders     None         Varney Biles, MD 03/26/22 1236

## 2022-03-26 NOTE — Progress Notes (Addendum)
I updated patient's daughter about patient's positive COVID test which may explain much of patient's dizziness. As day has progress, BP has gradually improved with IVF with now SBP 150-160 range. Daughter expressed concern that pt will not be willing to stay in hospital tonight and may cause some emotional issues Daughter is a nurse and will stay with patient at home for the next week She understands to hold BP meds for another 24 hours and monitor BPs at home and to push po fluids She know to gradually re-intiate BP meds depending on BP checks, starting with metoprolol. Rx for Paxlovid sent. Due to drug interaction, instructed pt/daughter to hold advair till 12/9 Given medical stability and safe home environment, daughter wishes for d/c home. Cefdinir 300 mg, #6, one po bid called to [employer]  DTat

## 2022-03-26 NOTE — H&P (Signed)
History and Physical    PatientTessica Booker JHE:[id number] DOB: [birth year] DOA: 03/26/2022 DOS: the patient was seen and examined on 03/26/2022 PCP: Sharion Balloon, FNP  Patient coming from: Home  Chief Complaint:  Chief Complaint  Patient presents with   Dizziness   HPI: Amanda Booker is a 71 year old female with history of bilateral renal artery stenosis, hypertension, diabetes mellitus type 2, hyperlipidemia, COPD, tobacco abuse, stroke with left hemiparesis presenting with generalized weakness and dizziness that began on the morning of 03/25/22.  History is supplemented by the patient's daughter at the bedside who is also a Marine scientist.  She states that the patient's systolic blood pressure has been running in the 120-130 range which is low in comparison to the patient's normal baseline with systolic blood pressure 185-631.  The patient has been feeling dizzy.  Daughter checked orthostatics on 03/25/2022 and 03/26/2022, and the patient systolic blood pressure dropped into the 80s with standing.  Daughter also relates that the patient has not been eating as well as usual.  The patient denies any fevers, chills, headache, chest pain, nausea, vomiting, diarrhea, abdominal pain, hematochezia, melena.  The patient has had a slightly worsening cough and slight worse dyspnea on exertion.  She continues to smoke 1/2 pack/day.  There is no hemoptysis.  Notably, the patient has had some low normal and low blood sugars in the mornings, but rechecked them and they improve into the low 100s after eating. The patient denies any focal extremity weakness, sensory disturbance, slurred speech, or visual disturbance.  She denies any new medications. In the ED, the patient was afebrile and hemodynamically stable with oxygen saturation 95% on room air.  The patient was orthostatic.  WBC 4.3, hemoglobin 12.9, platelets 215,000.  Sodium 136, potassium 4.2, bicarbonate 20, serum creatinine 1.52.  LFTs were unremarkable.   Troponin 6.  EKG shows sinus rhythm with no STT changes.  The patient was admitted for further evaluation and treatment of her dizziness and orthostasis.  Review of Systems: As mentioned in the history of present illness. All other systems reviewed and are negative. 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[place])    Cerebral ventricular shunt fitting or adjustment    Chronic kidney disease    Coagulopathy ([facility])    COPD (chronic obstructive pulmonary disease) ([facility])    Diabetes mellitus    Hypertension    Hypokalemia    Kidney stone    Renal artery stenosis [facility])    Past Surgical History:  Procedure Laterality Date   ABDOMINAL HYSTERECTOMY  2007   cerebral shunt     Social History:  reports that she has been smoking cigarettes. She has a 45.00 pack-year smoking history. She has never used smokeless tobacco. She reports that she does not drink alcohol and does not use drugs.  No Known Allergies  Family History  Problem Relation Age of Onset   Cancer Mother        uterine   Hyperlipidemia Mother    Cerebral aneurysm Sister    Hypertension Sister    Kidney failure Sister    Diabetes Brother    Hypertension Brother    Diabetes Brother    Hypertension Brother     Prior to Admission medications   Medication Sig Start Date End Date Taking? Authorizing Provider  albuterol (PROVENTIL) (2.5 MG/3ML) 0.083% nebulizer solution Take 3 mLs (2.5 mg total) by nebulization every 4 (four) hours as needed for wheezing or shortness of breath.  04/21/21 04/21/22  Sharion Balloon, FNP  albuterol (VENTOLIN HFA) 108 (90 Base) MCG/ACT inhaler Inhale 2 puffs into the lungs every 6 (six) hours as needed for wheezing or shortness of breath. 04/19/21   Evelina Dun A, FNP  amLODipine (NORVASC) 10 MG tablet TAKE 1 TABLET BY MOUTH ONCE DAILY IN THE MORNING 01/03/22   Evelina Dun A, FNP  aspirin 81 MG tablet Take 81 mg by mouth every morning.     [provider]   atorvastatin (LIPITOR) 40 MG tablet TAKE 1 TABLET BY MOUTH ONCE DAILY IN THE MORNING 01/17/22   Evelina Dun A, FNP  Blood Glucose Monitoring Suppl (ONETOUCH VERIO FLEX SYSTEM) w/Device KIT USE AS DIRECTED 4 TIMES DAILY Dx E11.69 02/15/22   Evelina Dun A, FNP  cloNIDine (CATAPRES) 0.3 MG tablet Take 1 tablet (0.3 mg total) by mouth 2 (two) times daily. 04/19/21 10/18/21  Evelina Dun A, FNP  fluticasone-salmeterol (ADVAIR HFA) [phone] MCG/ACT inhaler Inhale 2 puffs into the lungs 2 (two) times daily. 04/19/21   Sharion Balloon, FNP  glipiZIDE (GLUCOTROL) 10 MG tablet TAKE 1 TABLET BY MOUTH TWICE DAILY BEFORE A MEAL 03/05/22   Hawks, [place] A, FNP  glucose blood (ONETOUCH VERIO) test strip USE AS DIRECTED 4 TIMES DAILY Dx E11.69 02/15/22   Evelina Dun A, FNP  hydrochlorothiazide (HYDRODIURIL) 25 MG tablet TAKE 1 TABLET BY MOUTH ONCE DAILY IN THE MORNING 11/28/21   Hawks, [place] A, FNP  Lancets (ONETOUCH DELICA PLUS [id number]) MISC 4 (four) times daily. use as directed 04/19/21   [provider]  lisinopril (ZESTRIL) 40 MG tablet Take 1 tablet (40 mg total) by mouth daily. 05/30/21   Sharion Balloon, FNP  metFORMIN (GLUCOPHAGE) 1000 MG tablet Take 1 tablet (1,000 mg total) by mouth 2 (two) times daily with a meal. 04/19/21 10/18/21  Evelina Dun A, FNP  metoprolol succinate (TOPROL-XL) 50 MG 24 hr tablet Take 2 tablets (100 mg total) by mouth daily. Take with or immediately following a meal. 04/19/21 10/18/21  Evelina Dun A, FNP  spironolactone (ALDACTONE) 25 MG tablet Take 1 tablet (25 mg total) by mouth daily. 04/19/21   Sharion Balloon, FNP    Physical Exam: Vitals:   03/26/22 0704 03/26/22 0708 03/26/22 0727 03/26/22 0900  BP:  136/73  101/89  Pulse: 73   71  Resp: 17   20  Temp: 97.7 F (36.5 C)  97.7 F (36.5 C)   TempSrc: Oral  Oral   SpO2: 93%   95%  Weight:      Height:       GENERAL:  A&O x 3, NAD, well developed, cooperative, follows commands HEENT: [place]/AT,  No thrush, No icterus, No oral ulcers Neck:  No neck mass, No meningismus, soft, supple CV: RRR, no S3, no S4, no rub, no JVD Lungs: Diminished breath sounds.  Bibasilar rales.  No wheezing. Abd: soft/NT +BS, nondistended Ext: No edema, no lymphangitis, no cyanosis, no rashes Neuro:  CN II-XII intact, strength 4/5 in RUE, RLE, strength 4/5 LUE, LLE; sensation intact bilateral; no dysmetria; babinski equivocal  Data Reviewed: Data reviewed above in history  Assessment and Plan: Orthostatic hypotension/dizziness -Start IV fluids -B12 -TSH -PT eval -Check lactic acid -Echocardiogram -Certainly the patient may have a component of dysautonomia from her diabetes mellitus -check PCT  Pyuria -Obtain urine culture  Aortic stenosis -11/13/2019 echo EF 60 to 65%, no WMA, grade 1 DD, mild-mod AS -Repeat echo -Avoid hypotension  Controlled diabetes mellitus type  2 -Holding glipizide and metformin -The patient has been having some morning lows -I have instructed patient to stop taking her evening dose of glipizide -check A1C  Secondary hypertension -The patient has established renal artery stenosis -Allow for permissive hypertension -Holding clonidine, lisinopril, HCTZ, spironolactone, amlodipine, metoprolol -Her BP was as low as 101/89 in the ED while sitting in the gurney  COPD -Stable on room air -Start DuoNebs -Start Pulmicort -start brovana  Tobacco abuse -Tobacco cessation discussed  Mixed hyperlipidemia -Continue statin     Advance Care Planning: FULL  Consults: none  Family Communication: daughter 12/3  Severity of Illness: The appropriate patient status for this patient is OBSERVATION. Observation status is judged to be reasonable and necessary in order to provide the required intensity of service to ensure the patient's safety. The patient's presenting symptoms, physical exam findings, and initial radiographic and laboratory data in the context of their  medical condition is felt to place them at decreased risk for further clinical deterioration. Furthermore, it is anticipated that the patient will be medically stable for discharge from the hospital within 2 midnights of admission.   Author: Orson Eva, MD 03/26/2022 10:16 AM  For on call review [email or web address].

## 2022-03-26 NOTE — ED Triage Notes (Signed)
Pt BIB from home with daughter, c/o dizziness since yesterday with orthostatic hypotension with standing

## 2022-03-26 NOTE — ED Notes (Signed)
Food given to patient.  

## 2022-03-27 LAB — HEMOGLOBIN A1C
Hgb A1c MFr Bld: 7.1 % — ABNORMAL HIGH (ref 4.8–5.6)
Mean Plasma Glucose: 157 mg/dL

## 2022-03-28 DIAGNOSIS — E785 Hyperlipidemia, unspecified: Secondary | ICD-10-CM | POA: Insufficient documentation

## 2022-03-28 LAB — URINE CULTURE: Culture: 80000 — AB

## 2022-03-28 NOTE — Progress Notes (Deleted)
Cardiology Office Note   Date:  03/28/2022   ID:  Ricka, Westra [birth year], MRN [id number]  PCP:  Sharion Balloon, FNP  Cardiologist:   Minus Breeding, MD   No chief complaint on file.     History of Present Illness: Amanda Booker is a 71 y.o. female who is referred by Sharion Balloon, FNP for evaluation of multiple cardiovascular risk factors and PVD and a heart murmur.   She had bilateral less than 60% RAS on Doppler in 2015.   She was found to have mild to moderate AS.   This was last checked a week ago.   ***   ***   She was hypertensive and I added spironolactone. Since I last saw her she has done well.  The patient denies any new symptoms such as chest discomfort, neck or arm discomfort. There has been no new shortness of breath, PND or orthopnea. There have been no reported palpitations, presyncope or syncope.  She does her household activities and has no symptoms related to this.     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[place])    Cerebral ventricular shunt fitting or adjustment    Chronic kidney disease    Coagulopathy (HCC)    COPD (chronic obstructive pulmonary disease) (HCC)    Diabetes mellitus    Hypertension    Hypokalemia    Kidney stone    Renal artery stenosis [facility])     Past Surgical History:  Procedure Laterality Date   ABDOMINAL HYSTERECTOMY  2007   cerebral shunt       Current Outpatient Medications  Medication Sig Dispense Refill   albuterol (PROVENTIL) (2.5 MG/3ML) 0.083% nebulizer solution Take 3 mLs (2.5 mg total) by nebulization every 4 (four) hours as needed for wheezing or shortness of breath. 75 mL 2   albuterol (VENTOLIN HFA) 108 (90 Base) MCG/ACT inhaler Inhale 2 puffs into the lungs every 6 (six) hours as needed for wheezing or shortness of breath. 18 g 11   amLODipine (NORVASC) 10 MG tablet TAKE 1 TABLET BY MOUTH ONCE DAILY IN THE MORNING 90 tablet 0   aspirin 81 MG tablet Take 81 mg by  mouth every morning.      [START ON 04/01/2022] atorvastatin (LIPITOR) 40 MG tablet Take 1 tablet (40 mg total) by mouth every morning. 90 tablet 0   Blood Glucose Monitoring Suppl (ONETOUCH VERIO FLEX SYSTEM) w/Device KIT USE AS DIRECTED 4 TIMES DAILY Dx E11.69 1 kit 0   cloNIDine (CATAPRES) 0.3 MG tablet Take 1 tablet (0.3 mg total) by mouth 2 (two) times daily. 180 tablet 1   [START ON 04/01/2022] fluticasone-salmeterol (ADVAIR HFA) 45-21 MCG/ACT inhaler Inhale 2 puffs into the lungs 2 (two) times daily. Do not restart taking until 04/01/22 1 each 12   glipiZIDE (GLUCOTROL) 10 MG tablet Take 1 tablet (10 mg total) by mouth daily before breakfast. 180 tablet 0   glucose blood (ONETOUCH VERIO) test strip USE AS DIRECTED 4 TIMES DAILY Dx E11.69 400 each 3   hydrochlorothiazide (HYDRODIURIL) 25 MG tablet TAKE 1 TABLET BY MOUTH ONCE DAILY IN THE MORNING 90 tablet 1   Lancets (ONETOUCH DELICA PLUS [id number]) MISC 4 (four) times daily. use as directed     lisinopril (ZESTRIL) 40 MG tablet Take 1 tablet (40 mg total) by mouth daily. 90 tablet 3   metFORMIN (GLUCOPHAGE) 1000 MG tablet Take 1 tablet (1,000 mg total) by mouth  2 (two) times daily with a meal. 180 tablet 3   metoprolol succinate (TOPROL-XL) 50 MG 24 hr tablet Take 2 tablets (100 mg total) by mouth daily. Take with or immediately following a meal. (Patient taking differently: Take 50 mg by mouth 2 (two) times daily. Take with or immediately following a meal.) 180 tablet 1   Multiple Vitamins-Minerals (CENTRUM SILVER PO) Take 1 tablet by mouth daily.     nirmatrelvir/ritonavir EUA, renal dosing, (PAXLOVID) 10 x 150 MG & 10 x 100MG TABS Take 2 tablets by mouth 2 (two) times daily for 5 days. Patient GFR is 40. Take nirmatrelvir (150 mg) one tablet twice daily for 5 days and ritonavir (100 mg) one tablet twice daily for 5 days. 20 tablet 0   spironolactone (ALDACTONE) 25 MG tablet Take 1 tablet (25 mg total) by mouth daily. 90 tablet 3   No current  facility-administered medications for this visit.    Allergies:   Patient has no known allergies.     ROS:  Please see the history of present illness.   Otherwise, review of systems are positive for ***.   All other systems are reviewed and negative.    PHYSICAL EXAM: VS:  There were no vitals taken for this visit. , BMI There is no height or weight on file to calculate BMI. GENERAL:  Well appearing NECK:  No jugular venous distention, waveform within normal limits, carotid upstroke brisk and symmetric, no bruits, no thyromegaly LUNGS:  Clear to auscultation bilaterally CHEST:  Unremarkable HEART:  PMI not displaced or sustained,S1 and S2 within normal limits, no S3, no S4, no clicks, no rubs, *** murmurs ABD:  Flat, positive bowel sounds normal in frequency in pitch, no bruits, no rebound, no guarding, no midline pulsatile mass, no hepatomegaly, no splenomegaly EXT:  2 plus pulses throughout, no edema, no cyanosis no clubbing    ***GENERAL:  Well appearing NECK:  No jugular venous distention, waveform within normal limits, carotid upstroke brisk and symmetric, no bruits, no thyromegaly LUNGS:  Clear to auscultation bilaterally CHEST:  Unremarkable HEART:  PMI not displaced or sustained,S1 and S2 within normal limits, no S3, no S4, no clicks, no rubs, 3 of 6 apical systolic murmur radiating slightly at the aortic outflow tract, no diastolic murmurs ABD:  Flat, positive bowel sounds normal in frequency in pitch, no bruits, no rebound, no guarding, no midline pulsatile mass, no hepatomegaly, no splenomegaly EXT:  2 plus pulses throughout, no edema, no cyanosis no clubbing   EKG:  EKG is *** ordered today. ***  Recent Labs: 03/26/2022: ALT 16; BUN 60; Creatinine, Ser 1.52; Hemoglobin 12.9; Platelets 315; Potassium 4.2; Sodium 136; TSH 0.892    Lipid Panel    Component Value Date/Time   CHOL 139 10/15/2020 1025   TRIG 95 10/15/2020 1025   HDL 47 10/15/2020 1025   CHOLHDL 3.0  10/15/2020 1025   LDLCALC 74 10/15/2020 1025      Wt Readings from Last 3 Encounters:  03/26/22 163 lb (73.9 kg)  10/18/21 160 lb 12.8 oz (72.9 kg)  07/12/21 154 lb (69.9 kg)      Other studies Reviewed: Additional studies/ records that were reviewed today include: *** Review of the above records demonstrates:  Please see elsewhere in the note.     ASSESSMENT AND PLAN:  HTN:   Her blood pressure is *** much better controlled.  Her daughter is a Marine scientist who takes her blood pressure and says it is much better than  it has been.  Her chemistry after starting the spironolactone was okay.  Asked the daughter to make sure she gets another one at her next appointment.  This should be upcoming.   DM: Her A1c was *** 7.9.  She is having this followed by Sharion Balloon, FNP   DYSLIPIDEMIA:   LDL was *** 70. HDL 48.  She will continue the meds as listed.  RAS:    Her blood pressure is ***  controlled.  No further imaging is indicated.   AORTIC STENOSIS: This was moderate on echo last week.  ***   and I will follow this clinically probably with a repeat echocardiogram in 1 year.   TOBACCO ABUSE:    ***  We talked about this again today and she has no plans on quitting.     Current medicines are reviewed at length with the patient today.  The patient does not have concerns regarding medicines.  The following changes have been made:  ***.  Labs/ tests ordered today include:   ***  No orders of the defined types were placed in this encounter.    Disposition:   FU with me in *** months.     Signed, Minus Breeding, MD  03/28/2022 8:10 PM    [facility]

## 2022-03-29 ENCOUNTER — Ambulatory Visit: Payer: Medicare HMO | Admitting: Cardiology

## 2022-03-29 ENCOUNTER — Telehealth (HOSPITAL_BASED_OUTPATIENT_CLINIC_OR_DEPARTMENT_OTHER): Payer: Self-pay

## 2022-03-29 ENCOUNTER — Other Ambulatory Visit: Payer: Self-pay | Admitting: Family

## 2022-03-29 DIAGNOSIS — E1159 Type 2 diabetes mellitus with other circulatory complications: Secondary | ICD-10-CM

## 2022-03-29 DIAGNOSIS — E785 Hyperlipidemia, unspecified: Secondary | ICD-10-CM

## 2022-03-29 DIAGNOSIS — I350 Nonrheumatic aortic (valve) stenosis: Secondary | ICD-10-CM

## 2022-03-29 DIAGNOSIS — E118 Type 2 diabetes mellitus with unspecified complications: Secondary | ICD-10-CM

## 2022-03-29 DIAGNOSIS — I152 Hypertension secondary to endocrine disorders: Secondary | ICD-10-CM

## 2022-03-29 DIAGNOSIS — I701 Atherosclerosis of renal artery: Secondary | ICD-10-CM

## 2022-03-29 DIAGNOSIS — Z720 Tobacco use: Secondary | ICD-10-CM

## 2022-03-29 NOTE — Telephone Encounter (Signed)
Post ED Visit - Positive Culture Follow-up  Culture report reviewed by antimicrobial stewardship pharmacist: [facility] []  , Pharm.D. []  Enzo Bi, Pharm.D., BCPS AQ-ID []  , Pharm.D., BCPS []  Celedonio Miyamoto, Pharm.D., BCPS []  [place], Garvin Fila.D., BCPS, AAHIVP []  , Pharm.D., BCPS, AAHIVP [x]  Georgina Pillion, PharmD, BCPS []  , PharmD, BCPS []  [place], PharmD, BCPS []  [address], PharmD []  , PharmD, BCPS []  Estella Husk, PharmD  Pharmacy Team []  Lysle Pearl, PharmD []  , PharmD []  Phillips Climes, PharmD []  , Rph []  Agapito Games) , PharmD []  Verlan Friends, PharmD []  , PharmD []  Mervyn Gay, PharmD []  , PharmD []  Vinnie Level, PharmD []  [facility], PharmD []  , PharmD []  Len Childs, PharmD   Positive urine culture Treated with Cefdinir, organism sensitive to the same and no further patient follow-up is required at this time.  03/29/2022, 10:39 AM

## 2022-04-03 ENCOUNTER — Other Ambulatory Visit: Payer: Self-pay

## 2022-04-03 MED ORDER — AMLODIPINE BESYLATE 10 MG PO TABS: 10.0000 mg | ORAL_TABLET | Freq: Every morning | ORAL | 1 refills | Status: DC

## 2022-04-11 ENCOUNTER — Encounter: Payer: Self-pay | Admitting: Family

## 2022-04-11 ENCOUNTER — Other Ambulatory Visit: Payer: Self-pay | Admitting: Family

## 2022-04-11 ENCOUNTER — Ambulatory Visit
Admission: RE | Admit: 2022-04-11 | Discharge: 2022-04-11 | Disposition: A | Discharge status: Home / Self Care | Payer: Medicare HMO | Source: Ambulatory Visit | Attending: Family | Admitting: Family

## 2022-04-11 ENCOUNTER — Ambulatory Visit (INDEPENDENT_AMBULATORY_CARE_PROVIDER_SITE_OTHER): Payer: Medicare HMO | Admitting: Family

## 2022-04-11 VITALS — BP 106/66 | HR 71 | Temp 97.8°F | Ht 68.0 in | Wt 156.8 lb

## 2022-04-11 DIAGNOSIS — F17210 Nicotine dependence, cigarettes, uncomplicated: Secondary | ICD-10-CM | POA: Diagnosis not present

## 2022-04-11 DIAGNOSIS — I350 Nonrheumatic aortic (valve) stenosis: Secondary | ICD-10-CM

## 2022-04-11 DIAGNOSIS — I69354 Hemiplegia and hemiparesis following cerebral infarction affecting left non-dominant side: Secondary | ICD-10-CM | POA: Diagnosis not present

## 2022-04-11 DIAGNOSIS — Z122 Encounter for screening for malignant neoplasm of respiratory organs: Secondary | ICD-10-CM | POA: Diagnosis not present

## 2022-04-11 DIAGNOSIS — J4489 Other specified chronic obstructive pulmonary disease: Secondary | ICD-10-CM

## 2022-04-11 DIAGNOSIS — E1159 Type 2 diabetes mellitus with other circulatory complications: Secondary | ICD-10-CM | POA: Diagnosis not present

## 2022-04-11 DIAGNOSIS — Z1231 Encounter for screening mammogram for malignant neoplasm of breast: Secondary | ICD-10-CM

## 2022-04-11 DIAGNOSIS — E1169 Type 2 diabetes mellitus with other specified complication: Secondary | ICD-10-CM | POA: Diagnosis not present

## 2022-04-11 DIAGNOSIS — E785 Hyperlipidemia, unspecified: Secondary | ICD-10-CM | POA: Diagnosis not present

## 2022-04-11 DIAGNOSIS — R69 Illness, unspecified: Secondary | ICD-10-CM | POA: Diagnosis not present

## 2022-04-11 DIAGNOSIS — I701 Atherosclerosis of renal artery: Secondary | ICD-10-CM | POA: Diagnosis not present

## 2022-04-11 DIAGNOSIS — I152 Hypertension secondary to endocrine disorders: Secondary | ICD-10-CM

## 2022-04-11 LAB — BAYER DCA HB A1C WAIVED: HB A1C (BAYER DCA - WAIVED): 7.3 % — ABNORMAL HIGH (ref 4.8–5.6)

## 2022-04-11 MED ORDER — TRELEGY ELLIPTA 100-62.5-25 MCG/ACT IN AEPB: 1.0000 | INHALATION_SPRAY | Freq: Every day | RESPIRATORY_TRACT | 11 refills | Status: DC

## 2022-04-11 NOTE — Progress Notes (Signed)
Subjective:    Patient ID: Amanda Booker, female    DOB: 01-Jan-1951, 71 y.o.   MRN: [id number]  Chief Complaint  Patient presents with   Medical Management of Chronic Issues   PT presents to the office today for chronic follow up. 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 She is followed by Cardiologists annually for HTN, aortic valve stenosis, and hyperlipidemia.    She has COPD and denies any SOB. She continues to smoke 1/2 pack a day.   She has atherosclerosis and takes Lipitor. Doing well.   She went  to the office with COVID and dizziness on 03/26/22. Reports she is feeling better from that.  Hypertension This is a chronic problem. The current episode started more than 1 year ago. The problem has been resolved since onset. The problem is controlled. Associated symptoms include malaise/fatigue. Pertinent negatives include no blurred vision, peripheral edema or shortness of breath. Risk factors for coronary artery disease include diabetes mellitus, dyslipidemia, sedentary lifestyle and smoking/tobacco exposure. The current treatment provides moderate improvement. There is no history of heart failure.  Hyperlipidemia This is a chronic problem. The current episode started more than 1 year ago. The problem is controlled. Pertinent negatives include no shortness of breath. Current antihyperlipidemic treatment includes statins. The current treatment provides moderate improvement of lipids. Risk factors for coronary artery disease include diabetes mellitus, dyslipidemia, hypertension, a sedentary lifestyle and post-menopausal.  Diabetes She presents for her follow-up diabetic visit. She has type 2 diabetes mellitus. Pertinent negatives for diabetes include no blurred vision and no foot paresthesias. Symptoms are stable. Risk factors for coronary artery disease include diabetes mellitus, dyslipidemia, hypertension, sedentary lifestyle and post-menopausal. She is following a generally  healthy diet. Eye exam is not current.  Nicotine Dependence Presents for follow-up visit. Her urge triggers include company of smokers. The symptoms have been stable. She smokes < 1/2 a pack of cigarettes per day.      Review of Systems  Constitutional:  Positive for malaise/fatigue.  Eyes:  Negative for blurred vision.  Respiratory:  Negative for shortness of breath.        Objective:   Physical Exam Vitals reviewed.  Constitutional:      General: She is not in acute distress.    Appearance: She is well-developed.  HENT:     Head: Normocephalic and atraumatic.     Right Ear: Tympanic membrane normal.     Left Ear: Tympanic membrane normal.  Eyes:     Pupils: Pupils are equal, round, and reactive to light.  Neck:     Thyroid: No thyromegaly.  Cardiovascular:     Rate and Rhythm: Normal rate and regular rhythm.     Heart sounds: Normal heart sounds. No murmur heard. Pulmonary:     Effort: Pulmonary effort is normal. No respiratory distress.     Breath sounds: Rhonchi present. No wheezing.  Abdominal:     General: Bowel sounds are normal. There is no distension.     Palpations: Abdomen is soft.     Tenderness: There is no abdominal tenderness.  Musculoskeletal:        General: No tenderness. Normal range of motion.     Cervical back: Normal range of motion and neck supple.  Skin:    General: Skin is warm and dry.  Neurological:     Mental Status: She is alert and oriented to person, place, and time.     Cranial Nerves: No cranial nerve  deficit.     Motor: Weakness (mild left sided weakness) present.     Deep Tendon Reflexes: Reflexes are normal and symmetric.  Psychiatric:        Behavior: Behavior normal.        Thought Content: Thought content normal.        Judgment: Judgment normal.       BP 106/66   Pulse 71   Temp 97.8 F (36.6 C)   Ht _0  (1.727 m)   Wt 156 lb 12.8 oz (71.1 kg)   SpO2 98%   BMI 23.84 kg/m      Assessment & Plan:  Amanda Booker comes in today with chief complaint of Medical Management of Chronic Issues   Diagnosis and orders addressed:  1. DM type 2 with diabetic dyslipidemia ([place]) - CMP14+EGFR - CBC with Differential/Platelet - Bayer DCA Hb A1c Waived  2. Atherosclerosis of renal artery (HCC) - CMP14+EGFR - CBC with Differential/Platelet  3. Cigarette nicotine dependence without complication - CT CHEST LUNG CA SCREEN LOW DOSE W/O CM; Future - CMP14+EGFR - CBC with Differential/Platelet  4. COPD (chronic obstructive pulmonary disease) with chronic bronchitis ([place]) - Fluticasone-Umeclidin-Vilant (TRELEGY ELLIPTA) 100-62.5-25 MCG/ACT AEPB; Inhale 1 puff into the lungs daily.  Dispense: 1 each; Refill: 11 - CT CHEST LUNG CA SCREEN LOW DOSE W/O CM; Future - CMP14+EGFR - CBC with Differential/Platelet  5. Dyslipidemia - CMP14+EGFR - CBC with Differential/Platelet  6. Hyperlipidemia associated with type 2 diabetes mellitus (HCC) - CMP14+EGFR - CBC with Differential/Platelet  7. Hypertension associated with diabetes ([place]) - CMP14+EGFR - CBC with Differential/Platelet  8. Nonrheumatic aortic valve stenosis - CMP14+EGFR - CBC with Differential/Platelet  9. Hemiparesis affecting left side as late effect of cerebrovascular accident (CVA) ([place]) - CMP14+EGFR - CBC with Differential/Platelet  10. Screening for lung cancer - CT CHEST LUNG CA SCREEN LOW DOSE W/O CM; Future - CMP14+EGFR - CBC with Differential/Platelet   Labs pending Health Maintenance reviewed Diet and exercise encouraged  Follow up plan: 4 months    Evelina Dun, FNP

## 2022-04-11 NOTE — Patient Instructions (Signed)
Lung Cancer Screening A lung cancer screening is a test that checks for lung cancer when there are no symptoms or history of that disease. The screening is done to look for lung cancer in its very early stages. Finding cancer early improves the chances of successful treatment. It may save your life. Who should have a screening? You should be screened for lung cancer if all of these apply: You currently smoke, or you have quit smoking within the past 15 years. You are between the ages of 50 and 80 years old. Screening may be recommended up to age 80 depending on your overall health and other factors. You have a smoking history of 1 pack of cigarettes a day for 20 years or 2 packs a day for 10 years. How is screening done?  The recommended screening test is a low-dose computed tomography (LDCT) scan. This scan takes detailed images of the lungs. This allows a health care provider to look for abnormal cells. If you are at risk for lung cancer, it is recommended that you get screened once a year. Talk to your health care provider about the risks, benefits, and limitations of screening. What are the benefits of screening? Screening can find lung cancer early, before symptoms start and before it has spread outside of the lungs. The chances of curing lung cancer are greater if the cancer is diagnosed early. What are the risks of screening? The screening may show lung cancer when no cancer is present. Talk with your health care provider about what your results mean. In some cases, your health care provider may do more testing to confirm the results. The screening may not find lung cancer when it is present. You will be exposed to radiation from repeated LDCT tests, which can cause cancer in otherwise healthy people. How can I lower my risk of lung cancer? Make these lifestyle changes to lower your risk of developing lung cancer: Do not use any products that contain nicotine or tobacco. These products  include cigarettes, chewing tobacco, and vaping devices, such as e-cigarettes. If you need help quitting, ask your health care provider. Avoid secondhand smoke. Avoid exposure to radiation. Avoid exposure to radon gas. Have your home checked for radon regularly. Avoid things that cause cancer (carcinogens). Avoid living or working in places with high air pollution or diesel exhaust. Questions to ask your health care provider Am I eligible for lung cancer screening? Does my health insurance cover the cost of lung cancer screening? What happens if the lung cancer screening shows something of concern? How soon will I have results from my lung cancer screening? Is there anything that I need to do to prepare for my lung cancer screening? What happens if I decide not to have lung cancer screening? Where to find more information Ask your health care provider about the risks and benefits of screening. More information and resources are available from these organizations: American Cancer Society (ACS): www.cancer.org American Lung Association: www.lung.org National Cancer Institute: www.cancer.gov Contact a health care provider if: You start to show symptoms of lung cancer, including: A cough that will not go away. High-pitched whistling sounds when you breathe, most often when you breathe out (wheezing). Chest pain. Coughing up blood. Shortness of breath. Weight loss that cannot be explained. Constant tiredness (fatigue). Hoarse voice. Summary Lung cancer screening may find lung cancer before symptoms appear. Finding cancer early improves the chances of successful treatment. It may save your life. The recommended screening test is a low-dose   computed tomography (LDCT) scan that looks for abnormal cells in the lungs. If you are at risk for lung cancer, it is recommended that you get screened once a year. You can make lifestyle changes to lower your risk of lung cancer. Ask your health care  provider about the risks and benefits of screening. This information is not intended to replace advice given to you by your health care provider. Make sure you discuss any questions you have with your health care provider. Document Revised: 09/29/2020 Document Reviewed: 09/29/2020 Elsevier Patient Education  2023 Elsevier Inc.  

## 2022-04-12 ENCOUNTER — Telehealth: Payer: Self-pay | Admitting: Family

## 2022-04-12 ENCOUNTER — Telehealth: Payer: Self-pay

## 2022-04-12 ENCOUNTER — Other Ambulatory Visit: Payer: Medicare HMO

## 2022-04-12 ENCOUNTER — Other Ambulatory Visit: Payer: Self-pay

## 2022-04-12 DIAGNOSIS — E876 Hypokalemia: Secondary | ICD-10-CM | POA: Diagnosis not present

## 2022-04-12 LAB — CMP14+EGFR
ALT: 10 IU/L (ref 0–32)
AST: 13 IU/L (ref 0–40)
Albumin/Globulin Ratio: 1.9 (ref 1.2–2.2)
Albumin: 4.2 g/dL (ref 3.9–4.9)
Alkaline Phosphatase: 69 IU/L (ref 44–121)
BUN/Creatinine Ratio: 30 — ABNORMAL HIGH (ref 12–28)
BUN: 36 mg/dL — ABNORMAL HIGH (ref 8–27)
Bilirubin Total: <0.2 mg/dL (ref 0.0–1.2)
CO2: 20 mmol/L (ref 20–29)
Calcium: 9.9 mg/dL (ref 8.7–10.3)
Chloride: 106 mmol/L (ref 96–106)
Creatinine, Ser: 1.22 mg/dL — ABNORMAL HIGH (ref 0.57–1.00)
Globulin, Total: 2.2 g/dL (ref 1.5–4.5)
Glucose: 171 mg/dL — ABNORMAL HIGH (ref 70–99)
Potassium: 6 mmol/L (ref 3.5–5.2)
Sodium: 139 mmol/L (ref 134–144)
Total Protein: 6.4 g/dL (ref 6.0–8.5)
eGFR: 48 mL/min/{1.73_m2} — ABNORMAL LOW (ref >59)

## 2022-04-12 LAB — CBC WITH DIFFERENTIAL/PLATELET
Basophils Absolute: 0.1 10*3/uL (ref 0.0–0.2)
Basos: 2 %
EOS (ABSOLUTE): 0.2 10*3/uL (ref 0.0–0.4)
Eos: 3 %
Hematocrit: 34.9 % (ref 34.0–46.6)
Hemoglobin: 11.4 g/dL (ref 11.1–15.9)
Immature Grans (Abs): 0 10*3/uL (ref 0.0–0.1)
Immature Granulocytes: 0 %
Lymphocytes Absolute: 2.2 10*3/uL (ref 0.7–3.1)
Lymphs: 30 %
MCH: 30.3 pg (ref 26.6–33.0)
MCHC: 32.7 g/dL (ref 31.5–35.7)
MCV: 93 fL (ref 79–97)
Monocytes Absolute: 0.4 10*3/uL (ref 0.1–0.9)
Monocytes: 6 %
Neutrophils Absolute: 4.3 10*3/uL (ref 1.4–7.0)
Neutrophils: 59 %
Platelets: 325 10*3/uL (ref 150–450)
RBC: 3.76 x10E6/uL — ABNORMAL LOW (ref 3.77–5.28)
RDW: 11.8 % (ref 11.7–15.4)
WBC: 7.2 10*3/uL (ref 3.4–10.8)

## 2022-04-12 NOTE — Telephone Encounter (Signed)
Called and spoke to pt daughter she is bringing her In asap

## 2022-04-12 NOTE — Telephone Encounter (Signed)
Potassium is critically elevated. Can she come by now to repeat a STAT potassium? If she is having any cardiac symptoms like palpations or weakness, she needs to go to the ER.

## 2022-04-12 NOTE — Telephone Encounter (Signed)
Potassium level 6.0

## 2022-04-13 ENCOUNTER — Other Ambulatory Visit: Payer: Self-pay | Admitting: Family

## 2022-04-13 ENCOUNTER — Telehealth: Payer: Self-pay | Admitting: Family Medicine

## 2022-04-13 DIAGNOSIS — E875 Hyperkalemia: Secondary | ICD-10-CM

## 2022-04-13 LAB — POTASSIUM: Potassium: 5.6 mmol/L — ABNORMAL HIGH (ref 3.5–5.2)

## 2022-04-13 MED ORDER — SODIUM POLYSTYRENE SULFONATE PO POWD: ORAL | 1 refills | Status: DC

## 2022-04-13 NOTE — Telephone Encounter (Signed)
Nurse called patient and discussed.

## 2022-04-13 NOTE — Telephone Encounter (Signed)
Called and spoke with daughter confirmed pt was not having and cardiac sxs - stated she was getting up and dressed to bring her in

## 2022-04-13 NOTE — Telephone Encounter (Signed)
Spoke with pts daughter - @ 4:40 12/20 regarding state labs , advised her we still did not have it back and that the on call provider would be able to go over the results

## 2022-04-14 NOTE — Addendum Note (Signed)
Addended by: Tamera Punt on: 04/14/2022 10:13 AM   Modules accepted: Orders

## 2022-04-14 NOTE — Telephone Encounter (Signed)
I called and spoke with daughter. She just wanted to make sure that this opportunity could be used as a learning experience where the labs were not sent STAT and that things could have gone bad it the patients elevated potassium effected patients heart. Patients daughter was very thankful for the call back.

## 2022-04-18 ENCOUNTER — Other Ambulatory Visit: Payer: Medicare HMO

## 2022-04-18 DIAGNOSIS — E875 Hyperkalemia: Secondary | ICD-10-CM

## 2022-04-18 LAB — CMP14+EGFR
ALT: 10 IU/L (ref 0–32)
AST: 13 IU/L (ref 0–40)
Albumin/Globulin Ratio: 1.9 (ref 1.2–2.2)
Albumin: 4.2 g/dL (ref 3.9–4.9)
Alkaline Phosphatase: 65 IU/L (ref 44–121)
BUN/Creatinine Ratio: 22 (ref 12–28)
BUN: 32 mg/dL — ABNORMAL HIGH (ref 8–27)
Bilirubin Total: 0.2 mg/dL (ref 0.0–1.2)
CO2: 21 mmol/L (ref 20–29)
Calcium: 10.5 mg/dL — ABNORMAL HIGH (ref 8.7–10.3)
Chloride: 102 mmol/L (ref 96–106)
Creatinine, Ser: 1.45 mg/dL — ABNORMAL HIGH (ref 0.57–1.00)
Globulin, Total: 2.2 g/dL (ref 1.5–4.5)
Glucose: 202 mg/dL — ABNORMAL HIGH (ref 70–99)
Potassium: 5.1 mmol/L (ref 3.5–5.2)
Sodium: 138 mmol/L (ref 134–144)
Total Protein: 6.4 g/dL (ref 6.0–8.5)
eGFR: 39 mL/min/{1.73_m2} — ABNORMAL LOW (ref >59)

## 2022-04-20 ENCOUNTER — Other Ambulatory Visit: Payer: Self-pay | Admitting: Family

## 2022-04-25 NOTE — Telephone Encounter (Signed)
Attempt to contact pt without a return call in over 3 days, will close encounter. 

## 2022-04-26 ENCOUNTER — Other Ambulatory Visit: Payer: Self-pay | Admitting: Family

## 2022-04-26 DIAGNOSIS — E1159 Type 2 diabetes mellitus with other circulatory complications: Secondary | ICD-10-CM

## 2022-04-30 ENCOUNTER — Other Ambulatory Visit: Payer: Self-pay | Admitting: Family

## 2022-04-30 DIAGNOSIS — I152 Hypertension secondary to endocrine disorders: Secondary | ICD-10-CM

## 2022-05-04 ENCOUNTER — Other Ambulatory Visit: Payer: Self-pay | Admitting: Family

## 2022-05-04 DIAGNOSIS — E1169 Type 2 diabetes mellitus with other specified complication: Secondary | ICD-10-CM

## 2022-05-04 DIAGNOSIS — I152 Hypertension secondary to endocrine disorders: Secondary | ICD-10-CM

## 2022-05-31 ENCOUNTER — Other Ambulatory Visit: Payer: Self-pay | Admitting: Family

## 2022-05-31 DIAGNOSIS — E1169 Type 2 diabetes mellitus with other specified complication: Secondary | ICD-10-CM

## 2022-05-31 NOTE — Telephone Encounter (Signed)
Pt's med list says pt is taking once daily? Please clarify

## 2022-06-28 ENCOUNTER — Other Ambulatory Visit: Payer: Self-pay | Admitting: Family

## 2022-06-28 DIAGNOSIS — E1159 Type 2 diabetes mellitus with other circulatory complications: Secondary | ICD-10-CM

## 2022-07-14 ENCOUNTER — Other Ambulatory Visit: Payer: Self-pay | Admitting: Family

## 2022-07-14 DIAGNOSIS — E1169 Type 2 diabetes mellitus with other specified complication: Secondary | ICD-10-CM

## 2022-07-26 ENCOUNTER — Telehealth: Payer: Self-pay | Admitting: Family

## 2022-07-26 NOTE — Telephone Encounter (Signed)
[place] to schedule their annual wellness visit. Appointment made for 08/02/2022.  Thank you,  Colletta [place],  [place] [facility] Direct Dial ??[phone]

## 2022-07-28 ENCOUNTER — Other Ambulatory Visit: Payer: Self-pay | Admitting: Family

## 2022-07-28 DIAGNOSIS — I152 Hypertension secondary to endocrine disorders: Secondary | ICD-10-CM

## 2022-08-02 ENCOUNTER — Ambulatory Visit (INDEPENDENT_AMBULATORY_CARE_PROVIDER_SITE_OTHER): Payer: Medicare HMO

## 2022-08-02 VITALS — Ht 67.0 in | Wt 156.0 lb

## 2022-08-02 DIAGNOSIS — Z780 Asymptomatic menopausal state: Secondary | ICD-10-CM | POA: Diagnosis not present

## 2022-08-02 DIAGNOSIS — Z0000 Encounter for general adult medical examination without abnormal findings: Secondary | ICD-10-CM

## 2022-08-02 NOTE — Patient Instructions (Signed)
[name] , Thank you for taking time to come for your Medicare Wellness Visit. I appreciate your ongoing commitment to your health goals. Please review the following plan we discussed and let me know if I can assist you in the future.   These are the goals we discussed:  Goals      Exercise 3x per week (30 min per time)     Patient Stated     07/12/2021 AWV Goal: Exercise for General Health  Patient will verbalize understanding of the benefits of increased physical activity: Exercising regularly is important. It will improve your overall fitness, flexibility, and endurance. Regular exercise also will improve your overall health. It can help you control your weight, reduce stress, and improve your bone density. Over the next year, patient will increase physical activity as tolerated with a goal of at least 150 minutes of moderate physical activity per week.  You can tell that you are exercising at a moderate intensity if your heart starts beating faster and you start breathing faster but can still hold a conversation. Moderate-intensity exercise ideas include: Walking 1 mile (1.6 km) in about 15 minutes Biking Hiking Golfing Dancing Water aerobics Patient will verbalize understanding of everyday activities that increase physical activity by providing examples like the following: Yard work, such as: Insurance underwriterushing a lawn mower Raking and bagging leaves Washing your car Pushing a stroller Shoveling snow Gardening Washing windows or floors Patient will be able to explain general safety guidelines for exercising:  Before you start a new exercise program, talk with your health care provider. Do not exercise so much that you hurt yourself, feel dizzy, or get very short of breath. Wear comfortable clothes and wear shoes with good support. Drink plenty of water while you exercise to prevent dehydration or heat stroke. Work out until your breathing and your heartbeat get faster.          This is a list of the screening recommended for you and due dates:  Health Maintenance  Topic Date Due   DTaP/Tdap/Td vaccine (1 - Tdap) Never done   Zoster (Shingles) Vaccine (1 of 2) Never done   Screening for Lung Cancer  Never done   DEXA scan (bone density measurement)  07/10/2020   Eye exam for diabetics  11/05/2021   COVID-19 Vaccine (4 - 2023-24 season) 12/23/2021   Complete foot exam   04/19/2022   Hemoglobin A1C  10/11/2022   Yearly kidney health urinalysis for diabetes  10/19/2022   Flu Shot  11/23/2022   Mammogram  04/12/2023   Yearly kidney function blood test for diabetes  04/19/2023   Medicare Annual Wellness Visit  08/02/2023   Colon Cancer Screening  02/11/2025   Pneumonia Vaccine  Completed   Hepatitis C Screening: USPSTF Recommendation to screen - Ages 18-79 yo.  Completed   HPV Vaccine  Aged Out    Advanced directives: Advance directive discussed with you today. I have provided a copy for you to complete at home and have notarized. Once this is complete please bring a copy in to our office so we can scan it into your chart.   Conditions/risks identified: Aim for 30 minutes of exercise or brisk walking, 6-8 glasses of water, and 5 servings of fruits and vegetables each day.   Next appointment: Follow up in one year for your annual wellness visit    Preventive Care 65 Years and Older, Female Preventive care refers to lifestyle choices and visits with your health care provider that  can promote health and wellness. What does preventive care include? A yearly physical exam. This is also called an annual well check. Dental exams once or twice a year. Routine eye exams. Ask your health care provider how often you should have your eyes checked. Personal lifestyle choices, including: Daily care of your teeth and gums. Regular physical activity. Eating a healthy diet. Avoiding tobacco and drug use. Limiting alcohol use. Practicing safe sex. Taking low-dose  aspirin every day. Taking vitamin and mineral supplements as recommended by your health care provider. What happens during an annual well check? The services and screenings done by your health care provider during your annual well check will depend on your age, overall health, lifestyle risk factors, and family history of disease. Counseling  Your health care provider may ask you questions about your: Alcohol use. Tobacco use. Drug use. Emotional well-being. Home and relationship well-being. Sexual activity. Eating habits. History of falls. Memory and ability to understand (cognition). Work and work Astronomer. Reproductive health. Screening  You may have the following tests or measurements: Height, weight, and BMI. Blood pressure. Lipid and cholesterol levels. These may be checked every 5 years, or more frequently if you are over 72 years old. Skin check. Lung cancer screening. You may have this screening every year starting at age 64 if you have a 30-pack-year history of smoking and currently smoke or have quit within the past 15 years. Fecal occult blood test (FOBT) of the stool. You may have this test every year starting at age 43. Flexible sigmoidoscopy or colonoscopy. You may have a sigmoidoscopy every 5 years or a colonoscopy every 10 years starting at age 43. Hepatitis C blood test. Hepatitis B blood test. Sexually transmitted disease (STD) testing. Diabetes screening. This is done by checking your blood sugar (glucose) after you have not eaten for a while (fasting). You may have this done every 1-3 years. Bone density scan. This is done to screen for osteoporosis. You may have this done starting at age 53. Mammogram. This may be done every 1-2 years. Talk to your health care provider about how often you should have regular mammograms. Talk with your health care provider about your test results, treatment options, and if necessary, the need for more tests. Vaccines  Your  health care provider may recommend certain vaccines, such as: Influenza vaccine. This is recommended every year. Tetanus, diphtheria, and acellular pertussis (Tdap, Td) vaccine. You may need a Td booster every 10 years. Zoster vaccine. You may need this after age 51. Pneumococcal 13-valent conjugate (PCV13) vaccine. One dose is recommended after age 20. Pneumococcal polysaccharide (PPSV23) vaccine. One dose is recommended after age 68. Talk to your health care provider about which screenings and vaccines you need and how often you need them. This information is not intended to replace advice given to you by your health care provider. Make sure you discuss any questions you have with your health care provider. Document Released: 05/07/2015 Document Revised: 12/29/2015 Document Reviewed: 02/09/2015 Elsevier Interactive Patient Education  2017 [employer].  Fall Prevention in the Home Falls can cause injuries. They can happen to people of all ages. There are many things you can do to make your home safe and to help prevent falls. What can I do on the outside of my home? Regularly fix the edges of walkways and driveways and fix any cracks. Remove anything that might make you trip as you walk through a door, such as a raised step or threshold. Trim any  bushes or trees on the path to your home. Use bright outdoor lighting. Clear any walking paths of anything that might make someone trip, such as rocks or tools. Regularly check to see if handrails are loose or broken. Make sure that both sides of any steps have handrails. Any raised decks and porches should have guardrails on the edges. Have any leaves, snow, or ice cleared regularly. Use sand or salt on walking paths during winter. Clean up any spills in your garage right away. This includes oil or grease spills. What can I do in the bathroom? Use night lights. Install grab bars by the toilet and in the tub and shower. Do not use towel bars as  grab bars. Use non-skid mats or decals in the tub or shower. If you need to sit down in the shower, use a plastic, non-slip stool. Keep the floor dry. Clean up any water that spills on the floor as soon as it happens. Remove soap buildup in the tub or shower regularly. Attach bath mats securely with double-sided non-slip rug tape. Do not have throw rugs and other things on the floor that can make you trip. What can I do in the bedroom? Use night lights. Make sure that you have a light by your bed that is easy to reach. Do not use any sheets or blankets that are too big for your bed. They should not hang down onto the floor. Have a firm chair that has side arms. You can use this for support while you get dressed. Do not have throw rugs and other things on the floor that can make you trip. What can I do in the kitchen? Clean up any spills right away. Avoid walking on wet floors. Keep items that you use a lot in easy-to-reach places. If you need to reach something above you, use a strong step stool that has a grab bar. Keep electrical cords out of the way. Do not use floor polish or wax that makes floors slippery. If you must use wax, use non-skid floor wax. Do not have throw rugs and other things on the floor that can make you trip. What can I do with my stairs? Do not leave any items on the stairs. Make sure that there are handrails on both sides of the stairs and use them. Fix handrails that are broken or loose. Make sure that handrails are as long as the stairways. Check any carpeting to make sure that it is firmly attached to the stairs. Fix any carpet that is loose or worn. Avoid having throw rugs at the top or bottom of the stairs. If you do have throw rugs, attach them to the floor with carpet tape. Make sure that you have a light switch at the top of the stairs and the bottom of the stairs. If you do not have them, ask someone to add them for you. What else can I do to help prevent  falls? Wear shoes that: Do not have high heels. Have rubber bottoms. Are comfortable and fit you well. Are closed at the toe. Do not wear sandals. If you use a stepladder: Make sure that it is fully opened. Do not climb a closed stepladder. Make sure that both sides of the stepladder are locked into place. Ask someone to hold it for you, if possible. Clearly mark and make sure that you can see: Any grab bars or handrails. First and last steps. Where the edge of each step is. Use tools that  help you move around (mobility aids) if they are needed. These include: Canes. Walkers. Scooters. Crutches. Turn on the lights when you go into a dark area. Replace any light bulbs as soon as they burn out. Set up your furniture so you have a clear path. Avoid moving your furniture around. If any of your floors are uneven, fix them. If there are any pets around you, be aware of where they are. Review your medicines with your doctor. Some medicines can make you feel dizzy. This can increase your chance of falling. Ask your doctor what other things that you can do to help prevent falls. This information is not intended to replace advice given to you by your health care provider. Make sure you discuss any questions you have with your health care provider. Document Released: 02/04/2009 Document Revised: 09/16/2015 Document Reviewed: 05/15/2014 Elsevier Interactive Patient Education  2017 [employer].

## 2022-08-02 NOTE — Progress Notes (Signed)
Subjective:   Amanda Booker is a 72 y.o. female who presents for Medicare Annual (Subsequent) preventive examination. I connected with  Amanda Booker on 08/02/22 by a audio enabled telemedicine application and verified that I am speaking with the correct person using two identifiers.  Patient Location: Home  Provider Location: Home Office  I discussed the limitations of evaluation and management by telemedicine. The patient expressed understanding and agreed to proceed.  Review of Systems     Cardiac Risk Factors include: advanced age (>68men, >35 women);diabetes mellitus;dyslipidemia;hypertension     Objective:    Today's Vitals   08/02/22 1426  Weight: 156 lb (70.8 kg)  Height: 5\' 7"  (1.702 m)   Body mass index is 24.43 kg/m.     08/02/2022    2:29 PM 03/26/2022    7:02 AM 07/12/2021   12:14 PM 07/06/2020    2:12 PM 07/03/2019    1:41 PM 10/03/2013    9:54 AM 03/27/2013   11:02 PM  Advanced Directives  Does Patient Have a Medical Advance Directive? No No No No No Patient would like information Patient does not have advance directive  Would patient like information on creating a medical advance directive? No - Patient declined No - Patient declined No - Patient declined No - Patient declined No - Patient declined Advance directive brochure given (Outpatient ONLY)   Pre-existing out of facility DNR order (yellow form or pink MOST form)       No    Current Medications (verified) Outpatient Encounter Medications as of 08/02/2022  Medication Sig   albuterol (VENTOLIN HFA) 108 (90 Base) MCG/ACT inhaler Inhale 2 puffs into the lungs every 6 (six) hours as needed for wheezing or shortness of breath.   amLODipine (NORVASC) 10 MG tablet Take 1 tablet (10 mg total) by mouth every morning.   aspirin 81 MG tablet Take 81 mg by mouth every morning.    atorvastatin (LIPITOR) 40 MG tablet TAKE 1 TABLET BY MOUTH ONCE DAILY IN THE MORNING   Blood Glucose Monitoring Suppl (ONETOUCH  VERIO FLEX SYSTEM) w/Device KIT USE AS DIRECTED 4 TIMES DAILY Dx E11.69   cloNIDine (CATAPRES) 0.3 MG tablet Take 1 tablet by mouth twice daily   Fluticasone-Umeclidin-Vilant (TRELEGY ELLIPTA) 100-62.5-25 MCG/ACT AEPB Inhale 1 puff into the lungs daily.   glipiZIDE (GLUCOTROL) 10 MG tablet TAKE 1 TABLET BY MOUTH TWICE DAILY BEFORE A MEAL   glucose blood (ONETOUCH VERIO) test strip USE AS DIRECTED 4 TIMES DAILY Dx E11.69   hydrochlorothiazide (HYDRODIURIL) 25 MG tablet TAKE 1 TABLET BY MOUTH ONCE DAILY IN THE MORNING   Lancets (ONETOUCH DELICA PLUS LANCET33G) MISC 4 (four) times daily. use as directed   lisinopril (ZESTRIL) 40 MG tablet Take 1 tablet by mouth once daily   metoprolol succinate (TOPROL-XL) 50 MG 24 hr tablet Take 1 tablet (50 mg total) by mouth 2 (two) times daily. Take with or immediately following a meal.   Multiple Vitamins-Minerals (CENTRUM SILVER PO) Take 1 tablet by mouth daily.   sodium polystyrene (KAYEXALATE) powder Use 15 g for one day   spironolactone (ALDACTONE) 25 MG tablet Take 1 tablet by mouth once daily   albuterol (PROVENTIL) (2.5 MG/3ML) 0.083% nebulizer solution Take 3 mLs (2.5 mg total) by nebulization every 4 (four) hours as needed for wheezing or shortness of breath.   metFORMIN (GLUCOPHAGE) 1000 MG tablet Take 1 tablet (1,000 mg total) by mouth 2 (two) times daily with a meal.   No facility-administered encounter  medications on file as of 08/02/2022.    Allergies (verified) Patient has no known allergies.   History: Past Medical History:  Diagnosis Date   Cerebral aneurysm rupture    Cerebral ventricular shunt fitting or adjustment    Chronic kidney disease    Coagulopathy    COPD (chronic obstructive pulmonary disease)    Diabetes mellitus    Hypertension    Hypokalemia    Kidney stone    Renal artery stenosis    Past Surgical History:  Procedure Laterality Date   ABDOMINAL HYSTERECTOMY  2007   cerebral shunt     Family History  Problem  Relation Age of Onset   Cancer Mother        uterine   Hyperlipidemia Mother    Cerebral aneurysm Sister    Hypertension Sister    Kidney failure Sister    Diabetes Brother    Hypertension Brother    Diabetes Brother    Hypertension Brother    Breast cancer Neg Hx    Social History   Socioeconomic History   Marital status: Widowed    Spouse name: Not on file   Number of children: 3   Years of education: Not on file   Highest education level: 9th grade  Occupational History   Occupation: Designer, fashion/clothing    Comment: retired  Tobacco Use   Smoking status: Every Day    Packs/day: 0.75    Years: 60.00    Additional pack years: 0.00    Total pack years: 45.00    Types: Cigarettes   Smokeless tobacco: Never  Vaping Use   Vaping Use: Never used  Substance and Sexual Activity   Alcohol use: No   Drug use: No   Sexual activity: Not Currently  Other Topics Concern   Not on file  Social History Narrative   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 She lives with her daughter April. She has 3 grown children that she sees regularly. She reports that she is not physically active. She does attend church when she is able and she enjoys crossword puzzles and word searches.    Social Determinants of Health   Financial Resource Strain: Low Risk  (08/02/2022)   Overall Financial Resource Strain (CARDIA)    Difficulty of Paying Living Expenses: Not hard at all  Food Insecurity: No Food Insecurity (08/02/2022)   Hunger Vital Sign    Worried About Running Out of Food in the Last Year: Never true    Ran Out of Food in the Last Year: Never true  Transportation Needs: No Transportation Needs (08/02/2022)   PRAPARE - Administrator, Civil Service (Medical): No    Lack of Transportation (Non-Medical): No  Physical Activity: Inactive (08/02/2022)   Exercise Vital Sign    Days of Exercise per Week: 0 days    Minutes of Exercise per Session: 0 min  Stress: No Stress Concern Present (08/02/2022)    [employer] of Occupational Health - Occupational Stress Questionnaire    Feeling of Stress : Not at all  Social Connections: Socially Isolated (08/02/2022)   Social Connection and Isolation Panel [NHANES]    Frequency of Communication with Friends and Family: More than three times a week    Frequency of Social Gatherings with Friends and Family: More than three times a week    Attends Religious Services: Never    Database administrator or Organizations: No    Attends Banker Meetings: Never    Marital  Status: Widowed    Tobacco Counseling Ready to quit: No Counseling given: Not Answered   Clinical Intake:  Pre-visit preparation completed: Yes  Pain : No/denies pain     Nutritional Risks: None Diabetes: Yes CBG done?: No Did pt. bring in CBG monitor from home?: No  How often do you need to have someone help you when you read instructions, pamphlets, or other written materials from your doctor or pharmacy?: 1 - Never  Diabetic?yes  Nutrition Risk Assessment:  Has the patient had any N/V/D within the last 2 months?  No  Does the patient have any non-healing wounds?  No  Has the patient had any unintentional weight loss or weight gain?  No   Diabetes:  Is the patient diabetic?  Yes  If diabetic, was a CBG obtained today?  No  Did the patient bring in their glucometer from home?  No  How often do you monitor your CBG's? Once a day .   Financial Strains and Diabetes Management:  Are you having any financial strains with the device, your supplies or your medication? No .  Does the patient want to be seen by Chronic Care Management for management of their diabetes?  No  Would the patient like to be referred to a Nutritionist or for Diabetic Management?  No   Diabetic Exams:  Diabetic Eye Exam: Completed 09/2021 Diabetic Foot Exam: Overdue, Pt has been advised about the importance in completing this exam. Pt is scheduled for diabetic foot exam on next  office visit .   Interpreter Needed?: No  Information entered by :: Renie Ora, LPN   Activities of Daily Living    08/02/2022    2:29 PM  In your present state of health, do you have any difficulty performing the following activities:  Hearing? 0  Vision? 0  Difficulty concentrating or making decisions? 0  Walking or climbing stairs? 0  Dressing or bathing? 0  Doing errands, shopping? 0  Preparing Food and eating ? N  Using the Toilet? N  In the past six months, have you accidently leaked urine? N  Do you have problems with loss of bowel control? N  Managing your Medications? N  Managing your Finances? N  Housekeeping or managing your Housekeeping? N    Patient Care Team: Junie Spencer, FNP as PCP - General (Family Medicine) Rollene Rotunda, MD as PCP - Cardiology (Cardiology)  Indicate any recent Medical Services you may have received from other than [facility] providers in the past year (date may be approximate).     Assessment:   This is a routine wellness examination for Pristine.  Hearing/Vision screen Vision Screening - Comments:: Wears rx glasses - up to date with routine eye exams with  Dr.Cotter   Dietary issues and exercise activities discussed: Current Exercise Habits: The patient does not participate in regular exercise at present   Goals Addressed             This Visit's Progress    Exercise 3x per week (30 min per time)         Depression Screen    08/02/2022    2:28 PM 04/11/2022   12:04 PM 10/18/2021    2:57 PM 07/12/2021   12:11 PM 10/15/2020   10:23 AM 07/06/2020    2:13 PM 06/08/2020    2:21 PM  PHQ 2/9 Scores  PHQ - 2 Score 0 0 0 0 0 0 0  PHQ- 9 Score  0 1  0  Fall Risk    08/02/2022    2:27 PM 10/18/2021    2:57 PM 07/12/2021   12:08 PM 10/15/2020   10:23 AM 07/06/2020    2:12 PM  Fall Risk   Falls in the past year? 0 0 0 0 0  Number falls in past yr: 0 0 0    Injury with Fall? 0 0 0    Risk for fall due to : No Fall Risks  History of fall(s) No Fall Risks    Follow up Falls prevention discussed Falls evaluation completed Falls prevention discussed      FALL RISK PREVENTION PERTAINING TO THE HOME:  Any stairs in or around the home? No  If so, are there any without handrails? No  Home free of loose throw rugs in walkways, pet beds, electrical cords, etc? Yes  Adequate lighting in your home to reduce risk of falls? Yes   ASSISTIVE DEVICES UTILIZED TO PREVENT FALLS:  Life alert? No  Use of a cane, walker or w/c? Yes  Grab bars in the bathroom? No  Shower chair or bench in shower? No  Elevated toilet seat or a handicapped toilet? No          08/02/2022    2:30 PM 07/12/2021   12:13 PM 07/03/2019    1:44 PM  6CIT Screen  What Year? 0 points 0 points 0 points  What month? 0 points 0 points 0 points  What time? 0 points 0 points 0 points  Count back from 20 0 points 0 points 0 points  Months in reverse 0 points 4 points 0 points  Repeat phrase 0 points 2 points 6 points  Total Score 0 points 6 points 6 points    Immunizations Immunization History  Administered Date(s) Administered   Fluad Quad(high Dose 65+) 04/02/2019, 01/15/2020   Influenza,inj,Quad PF,6+ Mos 02/14/2018   Influenza-Unspecified 04/07/2021   PFIZER(Purple Top)SARS-COV-2 Vaccination 08/04/2019, 08/25/2019, 03/06/2020   Pneumococcal Conjugate-13 03/28/2018   Pneumococcal Polysaccharide-23 07/08/2015, 10/18/2021    TDAP status: Due, Education has been provided regarding the importance of this vaccine. Advised may receive this vaccine at local pharmacy or Health Dept. Aware to provide a copy of the vaccination record if obtained from local pharmacy or Health Dept. Verbalized acceptance and understanding.  Flu Vaccine status: Declined, Education has been provided regarding the importance of this vaccine but patient still declined. Advised may receive this vaccine at local pharmacy or Health Dept. Aware to provide a copy of the  vaccination record if obtained from local pharmacy or Health Dept. Verbalized acceptance and understanding.  Pneumococcal vaccine status: Up to date  Covid-19 vaccine status: Completed vaccines  Qualifies for Shingles Vaccine? Yes   Zostavax completed No   Shingrix Completed?: No.    Education has been provided regarding the importance of this vaccine. Patient has been advised to call insurance company to determine out of pocket expense if they have not yet received this vaccine. Advised may also receive vaccine at local pharmacy or Health Dept. Verbalized acceptance and understanding.  Screening Tests Health Maintenance  Topic Date Due   DTaP/Tdap/Td (1 - Tdap) Never done   Zoster Vaccines- Shingrix (1 of 2) Never done   Lung Cancer Screening  Never done   DEXA SCAN  07/10/2020   OPHTHALMOLOGY EXAM  11/05/2021   COVID-19 Vaccine (4 - 2023-24 season) 12/23/2021   FOOT EXAM  04/19/2022   HEMOGLOBIN A1C  10/11/2022   Diabetic kidney evaluation - Urine ACR  10/19/2022  INFLUENZA VACCINE  11/23/2022   MAMMOGRAM  04/12/2023   Diabetic kidney evaluation - eGFR measurement  04/19/2023   Medicare Annual Wellness (AWV)  08/02/2023   COLONOSCOPY (Pts 45-1725yrs Insurance coverage will need to be confirmed)  02/11/2025   Pneumonia Vaccine 6965+ Years old  Completed   Hepatitis C Screening  Completed   HPV VACCINES  Aged Out    Health Maintenance  Health Maintenance Due  Topic Date Due   DTaP/Tdap/Td (1 - Tdap) Never done   Zoster Vaccines- Shingrix (1 of 2) Never done   Lung Cancer Screening  Never done   DEXA SCAN  07/10/2020   OPHTHALMOLOGY EXAM  11/05/2021   COVID-19 Vaccine (4 - 2023-24 season) 12/23/2021   FOOT EXAM  04/19/2022    Colorectal cancer screening: Type of screening: Colonoscopy. Completed 02/12/2015. Repeat every 10 years  Mammogram status: Completed 04/11/2022. Repeat every year  Bone Density status: Ordered 08/02/2022. Pt provided with contact info and advised to  call to schedule appt.  Lung Cancer Screening: (Low Dose CT Chest recommended if Age 89-80 years, 30 pack-year currently smoking OR have quit w/in 15years.) does not qualify.   Lung Cancer Screening Referral: n/a  Additional Screening:  Hepatitis C Screening: does not qualify; Completed 0611/2021  Vision Screening: Recommended annual ophthalmology exams for early detection of glaucoma and other disorders of the eye. Is the patient up to date with their annual eye exam?  Yes  Who is the provider or what is the name of the office in which the patient attends annual eye exams? Dr.Cotter  If pt is not established with a provider, would they like to be referred to a provider to establish care? No .   Dental Screening: Recommended annual dental exams for proper oral hygiene  Community Resource Referral / Chronic Care Management: CRR required this visit?  No   CCM required this visit?  No      Plan:     I have personally reviewed and noted the following in the patient's chart:   Medical and social history Use of alcohol, tobacco or illicit drugs  Current medications and supplements including opioid prescriptions. Patient is not currently taking opioid prescriptions. Functional ability and status Nutritional status Physical activity Advanced directives List of other physicians Hospitalizations, surgeries, and ER visits in previous 12 months Vitals Screenings to include cognitive, depression, and falls Referrals and appointments  In addition, I have reviewed and discussed with patient certain preventive protocols, quality metrics, and best practice recommendations. A written personalized care plan for preventive services as well as general preventive health recommendations were provided to patient.     Lorrene ReidLaura L Wilson, LPN   [id number]/09604/01/2023   Nurse Notes: Due TDAP vaccine

## 2022-08-11 ENCOUNTER — Ambulatory Visit (INDEPENDENT_AMBULATORY_CARE_PROVIDER_SITE_OTHER): Payer: Medicare HMO

## 2022-08-11 ENCOUNTER — Other Ambulatory Visit: Payer: Self-pay | Admitting: Family

## 2022-08-11 ENCOUNTER — Encounter: Payer: Self-pay | Admitting: Family

## 2022-08-11 ENCOUNTER — Ambulatory Visit (INDEPENDENT_AMBULATORY_CARE_PROVIDER_SITE_OTHER): Payer: Medicare HMO | Admitting: Family

## 2022-08-11 VITALS — BP 107/70 | HR 66 | Temp 97.1°F | Ht 67.0 in | Wt 160.0 lb

## 2022-08-11 DIAGNOSIS — J4489 Other specified chronic obstructive pulmonary disease: Secondary | ICD-10-CM | POA: Diagnosis not present

## 2022-08-11 DIAGNOSIS — R252 Cramp and spasm: Secondary | ICD-10-CM | POA: Diagnosis not present

## 2022-08-11 DIAGNOSIS — Z122 Encounter for screening for malignant neoplasm of respiratory organs: Secondary | ICD-10-CM | POA: Diagnosis not present

## 2022-08-11 DIAGNOSIS — Z780 Asymptomatic menopausal state: Secondary | ICD-10-CM

## 2022-08-11 DIAGNOSIS — I701 Atherosclerosis of renal artery: Secondary | ICD-10-CM

## 2022-08-11 DIAGNOSIS — E1159 Type 2 diabetes mellitus with other circulatory complications: Secondary | ICD-10-CM | POA: Diagnosis not present

## 2022-08-11 DIAGNOSIS — E785 Hyperlipidemia, unspecified: Secondary | ICD-10-CM | POA: Diagnosis not present

## 2022-08-11 DIAGNOSIS — E1169 Type 2 diabetes mellitus with other specified complication: Secondary | ICD-10-CM | POA: Diagnosis not present

## 2022-08-11 DIAGNOSIS — Z0000 Encounter for general adult medical examination without abnormal findings: Secondary | ICD-10-CM

## 2022-08-11 DIAGNOSIS — Z0001 Encounter for general adult medical examination with abnormal findings: Secondary | ICD-10-CM | POA: Diagnosis not present

## 2022-08-11 DIAGNOSIS — M8589 Other specified disorders of bone density and structure, multiple sites: Secondary | ICD-10-CM | POA: Diagnosis not present

## 2022-08-11 DIAGNOSIS — F17210 Nicotine dependence, cigarettes, uncomplicated: Secondary | ICD-10-CM

## 2022-08-11 DIAGNOSIS — R69 Illness, unspecified: Secondary | ICD-10-CM | POA: Diagnosis not present

## 2022-08-11 DIAGNOSIS — M8580 Other specified disorders of bone density and structure, unspecified site: Secondary | ICD-10-CM

## 2022-08-11 DIAGNOSIS — I69354 Hemiplegia and hemiparesis following cerebral infarction affecting left non-dominant side: Secondary | ICD-10-CM | POA: Diagnosis not present

## 2022-08-11 DIAGNOSIS — I152 Hypertension secondary to endocrine disorders: Secondary | ICD-10-CM | POA: Diagnosis not present

## 2022-08-11 DIAGNOSIS — Z23 Encounter for immunization: Secondary | ICD-10-CM

## 2022-08-11 LAB — BAYER DCA HB A1C WAIVED: HB A1C (BAYER DCA - WAIVED): 7.2 % — ABNORMAL HIGH (ref 4.8–5.6)

## 2022-08-11 LAB — LIPID PANEL

## 2022-08-11 MED ORDER — ATORVASTATIN CALCIUM 40 MG PO TABS: 40.0000 mg | ORAL_TABLET | Freq: Every morning | ORAL | 0 refills | Status: DC

## 2022-08-11 MED ORDER — TRELEGY ELLIPTA 100-62.5-25 MCG/ACT IN AEPB: 1.0000 | INHALATION_SPRAY | Freq: Every day | RESPIRATORY_TRACT | 11 refills | Status: DC

## 2022-08-11 MED ORDER — ALBUTEROL SULFATE HFA 108 (90 BASE) MCG/ACT IN AERS: 2.0000 | INHALATION_SPRAY | Freq: Four times a day (QID) | RESPIRATORY_TRACT | 11 refills | Status: DC | PRN

## 2022-08-11 MED ORDER — LISINOPRIL 40 MG PO TABS: 40.0000 mg | ORAL_TABLET | Freq: Every day | ORAL | 0 refills | Status: DC

## 2022-08-11 MED ORDER — HYDROCHLOROTHIAZIDE 12.5 MG PO TABS: 12.5000 mg | ORAL_TABLET | Freq: Every day | ORAL | 3 refills | Status: DC

## 2022-08-11 MED ORDER — METFORMIN HCL 1000 MG PO TABS: 1000.0000 mg | ORAL_TABLET | Freq: Two times a day (BID) | ORAL | 3 refills | Status: DC

## 2022-08-11 MED ORDER — AMLODIPINE BESYLATE 10 MG PO TABS: 10.0000 mg | ORAL_TABLET | Freq: Every morning | ORAL | 1 refills | Status: DC

## 2022-08-11 MED ORDER — CLONIDINE HCL 0.3 MG PO TABS: 0.3000 mg | ORAL_TABLET | Freq: Two times a day (BID) | ORAL | 0 refills | Status: DC

## 2022-08-11 MED ORDER — SPIRONOLACTONE 25 MG PO TABS: 25.0000 mg | ORAL_TABLET | Freq: Every day | ORAL | 0 refills | Status: DC

## 2022-08-11 MED ORDER — METOPROLOL SUCCINATE ER 50 MG PO TB24: 50.0000 mg | ORAL_TABLET | Freq: Two times a day (BID) | ORAL | 1 refills | Status: DC

## 2022-08-11 MED ORDER — ALBUTEROL SULFATE (2.5 MG/3ML) 0.083% IN NEBU: 2.5000 mg | INHALATION_SOLUTION | RESPIRATORY_TRACT | 2 refills | Status: DC | PRN

## 2022-08-11 MED ORDER — GLIPIZIDE 10 MG PO TABS: 10.0000 mg | ORAL_TABLET | Freq: Two times a day (BID) | ORAL | 0 refills | Status: DC

## 2022-08-11 NOTE — Progress Notes (Signed)
Subjective:    Patient ID: Amanda Booker, female    DOB: [birth year], 72 y.o.   MRN: [id number]  Chief Complaint  Patient presents with   Medical Management of Chronic Issues   PT presents to the office today for CPE and chronic follow up. 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 She is followed by Cardiologists annually for HTN, aortic valve stenosis, and hyperlipidemia.    She has COPD and denies any SOB. She continues to smoke 1/2 pack a day.    She has atherosclerosis and takes Lipitor. Doing well.   PT complaining of bilateral leg cramps at night.  Hypertension This is a chronic problem. The current episode started more than 1 year ago. The problem has been resolved since onset. Pertinent negatives include no blurred vision, malaise/fatigue, peripheral edema or shortness of breath. Risk factors for coronary artery disease include dyslipidemia, sedentary lifestyle and smoking/tobacco exposure. The current treatment provides moderate improvement. Hypertensive end-organ damage includes CVA.  Hyperlipidemia This is a chronic problem. The current episode started more than 1 year ago. The problem is controlled. Recent lipid tests were reviewed and are normal. Pertinent negatives include no shortness of breath. Current antihyperlipidemic treatment includes statins. The current treatment provides moderate improvement of lipids. Risk factors for coronary artery disease include diabetes mellitus, dyslipidemia, hypertension, a sedentary lifestyle and post-menopausal.  Diabetes She presents for her follow-up diabetic visit. She has type 2 diabetes mellitus. There are no hypoglycemic associated symptoms. Pertinent negatives for diabetes include no blurred vision and no foot paresthesias. Diabetic complications include a CVA. Pertinent negatives for diabetic complications include no nephropathy or peripheral neuropathy. Risk factors for coronary artery disease include dyslipidemia, diabetes  mellitus, hypertension, sedentary lifestyle and post-menopausal. She is following a generally healthy diet. Her overall blood glucose range is 130-140 mg/dl.  Nicotine Dependence Presents for follow-up visit. Her urge triggers include company of smokers. The symptoms have been stable. She smokes < 1/2 a pack of cigarettes per day.      Review of Systems  Constitutional:  Negative for malaise/fatigue.  Eyes:  Negative for blurred vision.  Respiratory:  Negative for shortness of breath.   All other systems reviewed and are negative.  Family History  Problem Relation Age of Onset   Cancer Mother        uterine   Hyperlipidemia Mother    Cerebral aneurysm Sister    Hypertension Sister    Kidney failure Sister    Diabetes Brother    Hypertension Brother    Diabetes Brother    Hypertension Brother    Breast cancer Neg Hx    Social History   Socioeconomic History   Marital status: Widowed    Spouse name: Not on file   Number of children: 3   Years of education: Not on file   Highest education level: 9th grade  Occupational History   Occupation: Designer, fashion/clothing    Comment: retired  Tobacco Use   Smoking status: Every Day    Packs/day: 0.75    Years: 60.00    Additional pack years: 0.00    Total pack years: 45.00    Types: Cigarettes   Smokeless tobacco: Never  Vaping Use   Vaping Use: Never used  Substance and Sexual Activity   Alcohol use: No   Drug use: No   Sexual activity: Not Currently  Other Topics Concern   Not on file  Social History Narrative   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 She lives with her daughter April. She has 3 grown children that she sees regularly. She reports that she is not physically active. She does attend church when she is able and she enjoys crossword puzzles and word searches.    Social Determinants of Health   Financial Resource Strain: Low Risk  (08/02/2022)   Overall Financial Resource Strain (CARDIA)    Difficulty of Paying Living Expenses:  Not hard at all  Food Insecurity: No Food Insecurity (08/02/2022)   Hunger Vital Sign    Worried About Running Out of Food in the Last Year: Never true    Ran Out of Food in the Last Year: Never true  Transportation Needs: No Transportation Needs (08/02/2022)   PRAPARE - Administrator, Civil Service (Medical): No    Lack of Transportation (Non-Medical): No  Physical Activity: Inactive (08/02/2022)   Exercise Vital Sign    Days of Exercise per Week: 0 days    Minutes of Exercise per Session: 0 min  Stress: No Stress Concern Present (08/02/2022)   [employer] of Occupational Health - Occupational Stress Questionnaire    Feeling of Stress : Not at all  Social Connections: Socially Isolated (08/02/2022)   Social Connection and Isolation Panel [NHANES]    Frequency of Communication with Friends and Family: More than three times a week    Frequency of Social Gatherings with Friends and Family: More than three times a week    Attends Religious Services: Never    Database administrator or Organizations: No    Attends Banker Meetings: Never    Marital Status: Widowed        Objective:   Physical Exam Vitals reviewed.  Constitutional:      General: She is not in acute distress.    Appearance: She is well-developed.  HENT:     Head: Normocephalic and atraumatic.     Right Ear: Tympanic membrane normal.     Left Ear: Tympanic membrane normal.  Eyes:     Pupils: Pupils are equal, round, and reactive to light.  Neck:     Thyroid: No thyromegaly.  Cardiovascular:     Rate and Rhythm: Normal rate and regular rhythm.     Heart sounds: Normal heart sounds. No murmur heard. Pulmonary:     Effort: Pulmonary effort is normal. No respiratory distress.     Breath sounds: Normal breath sounds. No wheezing.  Abdominal:     General: Bowel sounds are normal. There is no distension.     Palpations: Abdomen is soft.     Tenderness: There is no abdominal tenderness.   Musculoskeletal:        General: No tenderness. Normal range of motion.     Cervical back: Normal range of motion and neck supple.  Skin:    General: Skin is warm and dry.  Neurological:     Mental Status: She is alert and oriented to person, place, and time.     Cranial Nerves: No cranial nerve deficit.     Motor: Weakness (left sided weakness) present.     Gait: Gait abnormal.     Deep Tendon Reflexes: Reflexes are normal and symmetric.  Psychiatric:        Behavior: Behavior normal.        Thought Content: Thought content normal.        Judgment: Judgment normal.     Diabetic Foot Exam - Simple   Simple Foot Form Diabetic Foot exam  was performed with the following findings: Yes 08/11/2022 11:22 AM  Visual Inspection No deformities, no ulcerations, no other skin breakdown bilaterally: Yes Sensation Testing Intact to touch and monofilament testing bilaterally: Yes Pulse Check Posterior Tibialis and Dorsalis pulse intact bilaterally: Yes Comments Toenails thick      BP 107/70   Pulse 66   Temp (!) 97.1 F (36.2 C) (Temporal)   Ht 5\' 7"  (1.702 m)   Wt 160 lb (72.6 kg)   SpO2 97%   BMI 25.06 kg/m      Assessment & Plan:  Damary Doland comes in today with chief complaint of Medical Management of Chronic Issues   Diagnosis and orders addressed:  1. COPD (chronic obstructive pulmonary disease) with chronic bronchitis - albuterol (VENTOLIN HFA) 108 (90 Base) MCG/ACT inhaler; Inhale 2 puffs into the lungs every 6 (six) hours as needed for wheezing or shortness of breath.  Dispense: 18 g; Refill: 11 - Fluticasone-Umeclidin-Vilant (TRELEGY ELLIPTA) 100-62.5-25 MCG/ACT AEPB; Inhale 1 puff into the lungs daily.  Dispense: 1 each; Refill: 11 - CMP14+EGFR - CBC with Differential/Platelet  2. Hypertension associated with diabetes Will decrease HCTZ to 12.5 mg related to leg cramps - amLODipine (NORVASC) 10 MG tablet; Take 1 tablet (10 mg total) by mouth every morning.   Dispense: 90 tablet; Refill: 1 - cloNIDine (CATAPRES) 0.3 MG tablet; Take 1 tablet (0.3 mg total) by mouth 2 (two) times daily.  Dispense: 180 tablet; Refill: 0 - lisinopril (ZESTRIL) 40 MG tablet; Take 1 tablet (40 mg total) by mouth daily.  Dispense: 90 tablet; Refill: 0 - metoprolol succinate (TOPROL-XL) 50 MG 24 hr tablet; Take 1 tablet (50 mg total) by mouth 2 (two) times daily. Take with or immediately following a meal.  Dispense: 180 tablet; Refill: 1 - spironolactone (ALDACTONE) 25 MG tablet; Take 1 tablet (25 mg total) by mouth daily.  Dispense: 90 tablet; Refill: 0 - hydrochlorothiazide (HYDRODIURIL) 12.5 MG tablet; Take 1 tablet (12.5 mg total) by mouth daily.  Dispense: 90 tablet; Refill: 3 - CMP14+EGFR - CBC with Differential/Platelet  3. Hyperlipidemia associated with type 2 diabetes mellitus - atorvastatin (LIPITOR) 40 MG tablet; Take 1 tablet (40 mg total) by mouth every morning.  Dispense: 90 tablet; Refill: 0 - CMP14+EGFR - CBC with Differential/Platelet  4. COPD (chronic obstructive pulmonary disease) with chronic bronchitis (HCC) - albuterol (VENTOLIN HFA) 108 (90 Base) MCG/ACT inhaler; Inhale 2 puffs into the lungs every 6 (six) hours as needed for wheezing or shortness of breath.  Dispense: 18 g; Refill: 11 - Fluticasone-Umeclidin-Vilant (TRELEGY ELLIPTA) 100-62.5-25 MCG/ACT AEPB; Inhale 1 puff into the lungs daily.  Dispense: 1 each; Refill: 11 - CMP14+EGFR - CBC with Differential/Platelet  5. DM type 2 with diabetic dyslipidemia - glipiZIDE (GLUCOTROL) 10 MG tablet; Take 1 tablet (10 mg total) by mouth 2 (two) times daily before a meal.  Dispense: 180 tablet; Refill: 0 - metFORMIN (GLUCOPHAGE) 1000 MG tablet; Take 1 tablet (1,000 mg total) by mouth 2 (two) times daily with a meal.  Dispense: 180 tablet; Refill: 3 - Bayer DCA Hb A1c Waived - CMP14+EGFR - CBC with Differential/Platelet  6. Atherosclerosis of renal artery - CMP14+EGFR - CBC with  Differential/Platelet  7. Cigarette nicotine dependence without complication - CMP14+EGFR - CBC with Differential/Platelet  8. Dyslipidemia - CMP14+EGFR - CBC with Differential/Platelet  9. Hemiparesis affecting left side as late effect of cerebrovascular accident (CVA) - CMP14+EGFR - CBC with Differential/Platelet  10. Renal artery stenosis - CMP14+EGFR - CBC with Differential/Platelet  11. Annual physical exam - Bayer DCA Hb A1c Waived - CMP14+EGFR - CBC with Differential/Platelet - Lipid panel - TSH  12. Screening for lung cancer - CMP14+EGFR - CBC with Differential/Platelet - Ambulatory Referral Lung Cancer Screening [facility]  13. Leg cramp Decreasing HCTZ to 12.5 mg from 25 mg   14. Post-menopausal - DG [facility] DEXA   Labs pending Health Maintenance reviewed Diet and exercise encouraged  Follow up plan: 4 months    Jannifer Rodney, FNP

## 2022-08-11 NOTE — Patient Instructions (Signed)
Leg Cramps Leg cramps occur when one or more muscles tighten and a person has no control over it (involuntary muscle contraction). Muscle cramps are most common in the calf muscles of the leg. They can occur during exercise or at rest. Leg cramps are painful, and they may last for a few seconds to a few minutes. Cramps may return several times before they finally stop. Usually, leg cramps are not caused by a serious medical problem. In many cases, the cause is not known. Some common causes include: Excessive physical effort (overexertion), such as during intense exercise. Doing the same motion over and over. Staying in a certain position for a long period of time. Improper preparation, form, or technique while doing a sport or an activity. Dehydration. Injury. Side effects of certain medicines. Abnormally low levels of minerals in your blood (electrolytes), especially potassium and calcium. This could result from: Pregnancy. Taking diuretic medicines. Follow these instructions at home: Eating and drinking Drink enough fluid to keep your urine pale yellow. Staying hydrated may help prevent cramps. Eat a healthy diet that includes plenty of nutrients to help your muscles function. A healthy diet includes fruits and vegetables, lean protein, whole grains, and low-fat or nonfat dairy products. Managing pain, stiffness, and swelling     Try massaging, stretching, and relaxing the affected muscle. Do this for several minutes at a time. If directed, put ice on areas that are sore or painful after a cramp. To do this: Put ice in a plastic bag. Place a towel between your skin and the bag. Leave the ice on for 20 minutes, 2-3 times a day. Remove the ice if your skin turns bright red. This is very important. If you cannot feel pain, heat, or cold, you have a greater risk of damage to the area. If directed, apply heat to muscles that are tense or tight. Do this before you exercise, or as often as told  by your health care provider. Use the heat source that your health care provider recommends, such as a moist heat pack or a heating pad. To do this: Place a towel between your skin and the heat source. Leave the heat on for 20-30 minutes. Remove the heat if your skin turns bright red. This is especially important if you are unable to feel pain, heat, or cold. You may have a greater risk of getting burned. Try taking hot showers or baths to help relax tight muscles. General instructions If you are having frequent leg cramps, avoid intense exercise for several days. Take over-the-counter and prescription medicines only as told by your health care provider. Keep all follow-up visits. This is important. Contact a health care provider if: Your leg cramps get more severe or more frequent, or they do not improve over time. Your foot becomes cold, numb, or blue. Summary Muscle cramps can develop in any muscle, but the most common place is in the calf muscles of the leg. Leg cramps are painful, and they may last for a few seconds to a few minutes. Usually, leg cramps are not caused by a serious medical problem. Often, the cause is not known. Stay hydrated, and take over-the-counter and prescription medicines only as told by your health care provider. This information is not intended to replace advice given to you by your health care provider. Make sure you discuss any questions you have with your health care provider. Document Revised: 08/27/2019 Document Reviewed: 08/27/2019 Elsevier Patient Education  2023 [employer] Inc[employer]  

## 2022-08-12 LAB — CBC WITH DIFFERENTIAL/PLATELET
Basophils Absolute: 0.1 10*3/uL (ref 0.0–0.2)
Basos: 1 %
EOS (ABSOLUTE): 0.2 10*3/uL (ref 0.0–0.4)
Eos: 4 %
Hematocrit: 37.7 % (ref 34.0–46.6)
Hemoglobin: 12.4 g/dL (ref 11.1–15.9)
Immature Grans (Abs): 0 10*3/uL (ref 0.0–0.1)
Immature Granulocytes: 0 %
Lymphocytes Absolute: 1.8 10*3/uL (ref 0.7–3.1)
Lymphs: 29 %
MCH: 30.2 pg (ref 26.6–33.0)
MCHC: 32.9 g/dL (ref 31.5–35.7)
MCV: 92 fL (ref 79–97)
Monocytes Absolute: 0.4 10*3/uL (ref 0.1–0.9)
Monocytes: 6 %
Neutrophils Absolute: 3.8 10*3/uL (ref 1.4–7.0)
Neutrophils: 60 %
Platelets: 305 10*3/uL (ref 150–450)
RBC: 4.1 x10E6/uL (ref 3.77–5.28)
RDW: 12.5 % (ref 11.7–15.4)
WBC: 6.2 10*3/uL (ref 3.4–10.8)

## 2022-08-12 LAB — CMP14+EGFR
ALT: 14 IU/L (ref 0–32)
AST: 14 IU/L (ref 0–40)
Albumin/Globulin Ratio: 1.9 (ref 1.2–2.2)
Albumin: 4.4 g/dL (ref 3.9–4.9)
Alkaline Phosphatase: 70 IU/L (ref 44–121)
BUN/Creatinine Ratio: 22 (ref 12–28)
BUN: 29 mg/dL — ABNORMAL HIGH (ref 8–27)
Bilirubin Total: 0.3 mg/dL (ref 0.0–1.2)
CO2: 21 mmol/L (ref 20–29)
Calcium: 10.5 mg/dL — ABNORMAL HIGH (ref 8.7–10.3)
Chloride: 107 mmol/L — ABNORMAL HIGH (ref 96–106)
Creatinine, Ser: 1.32 mg/dL — ABNORMAL HIGH (ref 0.57–1.00)
Globulin, Total: 2.3 g/dL (ref 1.5–4.5)
Glucose: 109 mg/dL — ABNORMAL HIGH (ref 70–99)
Potassium: 5.8 mmol/L — ABNORMAL HIGH (ref 3.5–5.2)
Sodium: 141 mmol/L (ref 134–144)
Total Protein: 6.7 g/dL (ref 6.0–8.5)
eGFR: 44 mL/min/{1.73_m2} — ABNORMAL LOW (ref >59)

## 2022-08-12 LAB — LIPID PANEL
Chol/HDL Ratio: 2.6 ratio (ref 0.0–4.4)
Cholesterol, Total: 137 mg/dL (ref 100–199)
HDL: 52 mg/dL (ref >39)
LDL Chol Calc (NIH): 69 mg/dL (ref 0–99)
VLDL Cholesterol Cal: 16 mg/dL (ref 5–40)

## 2022-08-12 LAB — TSH: TSH: 1.26 u[IU]/mL (ref 0.450–4.500)

## 2022-08-15 ENCOUNTER — Other Ambulatory Visit: Payer: Self-pay

## 2022-08-15 DIAGNOSIS — E875 Hyperkalemia: Secondary | ICD-10-CM

## 2022-08-15 MED ORDER — SODIUM POLYSTYRENE SULFONATE 15 GM/60ML PO SUSP: 15.0000 g | Freq: Once | ORAL | 0 refills | Status: AC

## 2022-08-17 ENCOUNTER — Telehealth: Payer: Self-pay | Admitting: Family

## 2022-08-18 ENCOUNTER — Other Ambulatory Visit: Payer: Medicare HMO

## 2022-08-18 DIAGNOSIS — E875 Hyperkalemia: Secondary | ICD-10-CM | POA: Diagnosis not present

## 2022-08-18 NOTE — Telephone Encounter (Signed)
Patient aware and verbalized understanding. °

## 2022-08-18 NOTE — Telephone Encounter (Signed)
Lets [place] wait until her potassium is rechecked to see what we need to do. Low K+ diet.

## 2022-08-19 LAB — CMP14+EGFR
ALT: 14 IU/L (ref 0–32)
AST: 14 IU/L (ref 0–40)
Albumin/Globulin Ratio: 1.9 (ref 1.2–2.2)
Albumin: 4.1 g/dL (ref 3.9–4.9)
Alkaline Phosphatase: 71 IU/L (ref 44–121)
BUN/Creatinine Ratio: 24 (ref 12–28)
BUN: 32 mg/dL — ABNORMAL HIGH (ref 8–27)
Bilirubin Total: 0.3 mg/dL (ref 0.0–1.2)
CO2: 21 mmol/L (ref 20–29)
Calcium: 10.2 mg/dL (ref 8.7–10.3)
Chloride: 102 mmol/L (ref 96–106)
Creatinine, Ser: 1.32 mg/dL — ABNORMAL HIGH (ref 0.57–1.00)
Globulin, Total: 2.2 g/dL (ref 1.5–4.5)
Glucose: 159 mg/dL — ABNORMAL HIGH (ref 70–99)
Potassium: 4.8 mmol/L (ref 3.5–5.2)
Sodium: 139 mmol/L (ref 134–144)
Total Protein: 6.3 g/dL (ref 6.0–8.5)
eGFR: 44 mL/min/{1.73_m2} — ABNORMAL LOW (ref >59)

## 2022-10-28 ENCOUNTER — Other Ambulatory Visit: Payer: Self-pay | Admitting: Family

## 2022-10-28 DIAGNOSIS — E1169 Type 2 diabetes mellitus with other specified complication: Secondary | ICD-10-CM

## 2022-12-12 ENCOUNTER — Encounter: Payer: Self-pay | Admitting: Family

## 2022-12-12 ENCOUNTER — Ambulatory Visit (INDEPENDENT_AMBULATORY_CARE_PROVIDER_SITE_OTHER): Payer: Medicare HMO | Admitting: Family

## 2022-12-12 VITALS — BP 90/56 | HR 64 | Temp 97.5°F | Ht 67.0 in | Wt 157.0 lb

## 2022-12-12 DIAGNOSIS — M8580 Other specified disorders of bone density and structure, unspecified site: Secondary | ICD-10-CM | POA: Diagnosis not present

## 2022-12-12 DIAGNOSIS — F17210 Nicotine dependence, cigarettes, uncomplicated: Secondary | ICD-10-CM

## 2022-12-12 DIAGNOSIS — E1159 Type 2 diabetes mellitus with other circulatory complications: Secondary | ICD-10-CM

## 2022-12-12 DIAGNOSIS — I152 Hypertension secondary to endocrine disorders: Secondary | ICD-10-CM

## 2022-12-12 DIAGNOSIS — I69354 Hemiplegia and hemiparesis following cerebral infarction affecting left non-dominant side: Secondary | ICD-10-CM

## 2022-12-12 DIAGNOSIS — Z7984 Long term (current) use of oral hypoglycemic drugs: Secondary | ICD-10-CM | POA: Diagnosis not present

## 2022-12-12 DIAGNOSIS — I701 Atherosclerosis of renal artery: Secondary | ICD-10-CM

## 2022-12-12 DIAGNOSIS — E785 Hyperlipidemia, unspecified: Secondary | ICD-10-CM | POA: Diagnosis not present

## 2022-12-12 DIAGNOSIS — I350 Nonrheumatic aortic (valve) stenosis: Secondary | ICD-10-CM | POA: Diagnosis not present

## 2022-12-12 DIAGNOSIS — J4489 Other specified chronic obstructive pulmonary disease: Secondary | ICD-10-CM | POA: Diagnosis not present

## 2022-12-12 DIAGNOSIS — Z122 Encounter for screening for malignant neoplasm of respiratory organs: Secondary | ICD-10-CM

## 2022-12-12 DIAGNOSIS — I959 Hypotension, unspecified: Secondary | ICD-10-CM | POA: Diagnosis not present

## 2022-12-12 DIAGNOSIS — Z23 Encounter for immunization: Secondary | ICD-10-CM

## 2022-12-12 DIAGNOSIS — E1169 Type 2 diabetes mellitus with other specified complication: Secondary | ICD-10-CM | POA: Diagnosis not present

## 2022-12-12 LAB — CBC WITH DIFFERENTIAL/PLATELET
Basophils Absolute: 0.1 10*3/uL (ref 0.0–0.2)
Basos: 1 %
EOS (ABSOLUTE): 0.2 10*3/uL (ref 0.0–0.4)
Eos: 3 %
Hematocrit: 37.3 % (ref 34.0–46.6)
Hemoglobin: 12.4 g/dL (ref 11.1–15.9)
Immature Grans (Abs): 0 10*3/uL (ref 0.0–0.1)
Immature Granulocytes: 0 %
Lymphocytes Absolute: 1.6 10*3/uL (ref 0.7–3.1)
Lymphs: 22 %
MCH: 30.1 pg (ref 26.6–33.0)
MCHC: 33.2 g/dL (ref 31.5–35.7)
MCV: 91 fL (ref 79–97)
Monocytes Absolute: 0.5 10*3/uL (ref 0.1–0.9)
Monocytes: 7 %
Neutrophils Absolute: 5 10*3/uL (ref 1.4–7.0)
Neutrophils: 67 %
Platelets: 312 10*3/uL (ref 150–450)
RBC: 4.12 x10E6/uL (ref 3.77–5.28)
RDW: 11.9 % (ref 11.7–15.4)
WBC: 7.4 10*3/uL (ref 3.4–10.8)

## 2022-12-12 LAB — CMP14+EGFR
ALT: 8 IU/L (ref 0–32)
AST: 10 IU/L (ref 0–40)
Albumin: 4.3 g/dL (ref 3.9–4.9)
Alkaline Phosphatase: 67 IU/L (ref 44–121)
BUN/Creatinine Ratio: 23 (ref 12–28)
BUN: 37 mg/dL — ABNORMAL HIGH (ref 8–27)
Bilirubin Total: 0.2 mg/dL (ref 0.0–1.2)
CO2: 20 mmol/L (ref 20–29)
Calcium: 10.4 mg/dL — ABNORMAL HIGH (ref 8.7–10.3)
Chloride: 106 mmol/L (ref 96–106)
Creatinine, Ser: 1.59 mg/dL — ABNORMAL HIGH (ref 0.57–1.00)
Globulin, Total: 2.1 g/dL (ref 1.5–4.5)
Glucose: 125 mg/dL — ABNORMAL HIGH (ref 70–99)
Potassium: 5.3 mmol/L — ABNORMAL HIGH (ref 3.5–5.2)
Sodium: 141 mmol/L (ref 134–144)
Total Protein: 6.4 g/dL (ref 6.0–8.5)
eGFR: 35 mL/min/{1.73_m2} — ABNORMAL LOW (ref >59)

## 2022-12-12 LAB — BAYER DCA HB A1C WAIVED: HB A1C (BAYER DCA - WAIVED): 7.9 % — ABNORMAL HIGH (ref 4.8–5.6)

## 2022-12-12 MED ORDER — LISINOPRIL 20 MG PO TABS
20.0000 mg | ORAL_TABLET | Freq: Every day | ORAL | 3 refills | Status: DC
Start: 2022-12-12 — End: 2023-08-06

## 2022-12-12 NOTE — Patient Instructions (Signed)
Hypotension As the heart beats, it forces blood through the body. Hypotension, commonly called low blood pressure, is when the force of blood pumping through the arteries is too weak. Arteries are blood vessels that carry blood from the heart throughout the body. Depending on the cause and severity, hypotension may be harmless (benign) or may cause serious problems (be critical). When your blood pressure is too low, you may not get enough blood to your brain or to the rest of your organs. This can cause weakness, light-headedness, a rapid heartbeat, and fainting. What are the causes? This condition may be caused by: Blood loss. Loss of body fluids (dehydration). Heart problems. Hormone (endocrine) problems. Pregnancy. Severe infection. Lack of certain nutrients. Severe allergic reactions (anaphylaxis). Certain medicines, such as blood pressure medicine or medicines that make the body lose excess fluids (diuretics). Sometimes, hypotension may be caused by not taking medicine as directed, such as taking too much of a certain medicine. What increases the risk? The following factors may make you more likely to develop this condition: Age. Risk increases as you get older. Having a condition that affects the heart or the central nervous system. What are the signs or symptoms? Common symptoms of this condition include: Weakness. Light-headedness. Dizziness. Blurred vision. Tiredness (fatigue). Rapid heartbeat. Fainting, in severe cases. How is this diagnosed? This condition is diagnosed based on: Your medical history. Your symptoms. Your blood pressure measurement. Your health care provider will check your blood pressure when you are: Lying down. Sitting. Standing. A blood pressure reading is recorded as two numbers, such as "120 over 80" (or 120/80). The first ("top") number is called the systolic pressure. It is a measure of the pressure in your arteries as your heart beats. The second  ("bottom") number is called the diastolic pressure. It is a measure of the pressure in your arteries when your heart relaxes between beats. Blood pressure is measured in a unit called mm Hg. Healthy blood pressure for most adults is 120/80. If your blood pressure is below 90/60, you may be diagnosed with hypotension. Other information or tests that may be used to diagnose hypotension include: Your other vital signs, such as your heart rate and temperature. Blood tests. Tilt table test. For this test, you will be safely secured to a table that moves you from a lying position to an upright position. Your heart rhythm and blood pressure will be monitored during the test. How is this treated? Treatment for this condition may include: Changing your diet. This may involve drinking more water or increasing your salt (sodium) intake with high-sodium foods. Taking medicines to raise your blood pressure. Changing the dosage of certain medicines you are taking that might be lowering your blood pressure. Wearing compression stockings. These stockings help to prevent blood clots and reduce swelling in your legs. In some cases, you may need to go to the hospital for: Fluid replacement. This means you will receive fluids through an IV. Blood replacement. This means you will receive donated blood through an IV (transfusion). Treating an infection or heart problems, if this applies. Monitoring. You may need to be monitored while medicines that you are taking wear off. Follow these instructions at home: Eating and drinking  Drink enough fluid to keep your urine pale yellow. Eat a healthy diet, and follow instructions from your health care provider about eating or drinking restrictions. A healthy diet includes: Fresh fruits and vegetables. Whole grains. Lean meats. Low-fat dairy products. Increase your salt intake if told  to do so. Do not add extra salt to your diet unless your health care provider tells you  to do that. Eat frequent, small meals. Avoid standing up suddenly after eating. Medicines Take over-the-counter and prescription medicines only as told by your health care provider. Follow instructions from your health care provider about changing the dosage of your current medicines, if this applies. Do not stop or adjust any of your medicines on your own. General instructions  Wear compression stockings as told by your health care provider. Get up slowly from lying down or sitting positions. This gives your blood pressure a chance to adjust. Avoid hot showers and excessive heat as directed by your health care provider. Return to your normal activities as told by your health care provider. Ask your health care provider what activities are safe for you. Do not use any products that contain nicotine or tobacco. These products include cigarettes, chewing tobacco, and vaping devices, such as e-cigarettes. If you need help quitting, ask your health care provider. Keep all follow-up visits. This is important. Contact a health care provider if: You vomit. You have diarrhea. You have a fever for more than 2-3 days. You feel more thirsty than usual. You feel weak and tired. Get help right away if: You have chest pain. You have a fast or irregular heartbeat. You develop numbness in any part of your body. You cannot move your arms or your legs. You have trouble speaking. You become sweaty or feel light-headed. You faint. You feel short of breath. You have trouble staying awake. You feel confused. These symptoms may be an emergency. Get help right away. Call 911. Do not wait to see if the symptoms will go away. Do not drive yourself to the hospital. Summary Hypotension is when the force of blood pumping through the arteries is too weak. Hypotension may be harmless (benign) or may cause serious problems (be critical). Treatment for this condition may include changing your diet, changing  your medicines, and wearing compression stockings. In some cases, you may need to go to the hospital for fluid or blood replacement. This information is not intended to replace advice given to you by your health care provider. Make sure you discuss any questions you have with your health care provider. Document Revised: 11/29/2020 Document Reviewed: 11/29/2020 Elsevier Patient Education  2024 [employer].

## 2022-12-12 NOTE — Progress Notes (Signed)
Subjective:    Patient ID: Amanda Booker, female    DOB: 02-Jun-1950, 72 y.o.   MRN: [id number]  Chief Complaint  Patient presents with   Medical Management of Chronic Issues   PT presents to the office today for chronic follow up. 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   She is followed by Cardiologists annually for HTN, aortic valve stenosis, and hyperlipidemia.    She has COPD and denies any SOB. She continues to smoke 1/2 pack a day.    She has atherosclerosis and takes Lipitor. Doing well.    PT complaining of bilateral leg cramps at night.   She has osteopenia and taking calcium and vit D daily. Last Dexa scan 08/11/22. Hypertension This is a chronic problem. The current episode started more than 1 year ago. The problem has been resolved since onset. The problem is controlled. Associated symptoms include shortness of breath. Pertinent negatives include no blurred vision, malaise/fatigue or peripheral edema. Risk factors for coronary artery disease include dyslipidemia and sedentary lifestyle. The current treatment provides moderate improvement.  Hyperlipidemia This is a chronic problem. The current episode started more than 1 year ago. The problem is controlled. Recent lipid tests were reviewed and are normal. Associated symptoms include shortness of breath. Current antihyperlipidemic treatment includes statins. The current treatment provides moderate improvement of lipids. Risk factors for coronary artery disease include dyslipidemia, hypertension, a sedentary lifestyle and post-menopausal.  Diabetes She presents for her follow-up diabetic visit. She has type 2 diabetes mellitus. Pertinent negatives for diabetes include no blurred vision and no foot paresthesias. Symptoms are stable. Risk factors for coronary artery disease include dyslipidemia, diabetes mellitus, hypertension, sedentary lifestyle and post-menopausal. She is following a generally healthy diet. Her  overall blood glucose range is 110-130 mg/dl.  Nicotine Dependence Presents for follow-up visit. Her urge triggers include company of smokers. The symptoms have been stable. She smokes < 1/2 a pack of cigarettes per day.      Review of Systems  Constitutional:  Negative for malaise/fatigue.  Eyes:  Negative for blurred vision.  Respiratory:  Positive for shortness of breath.   All other systems reviewed and are negative.      Objective:   Physical Exam Vitals reviewed.  Constitutional:      General: She is not in acute distress.    Appearance: She is well-developed.  HENT:     Head: Normocephalic and atraumatic.     Right Ear: Tympanic membrane normal.     Left Ear: Tympanic membrane normal.  Eyes:     Pupils: Pupils are equal, round, and reactive to light.  Neck:     Thyroid: No thyromegaly.  Cardiovascular:     Rate and Rhythm: Normal rate and regular rhythm.     Heart sounds: Normal heart sounds. No murmur heard. Pulmonary:     Effort: Pulmonary effort is normal. No respiratory distress.     Breath sounds: Normal breath sounds. No wheezing.  Abdominal:     General: Bowel sounds are normal. There is no distension.     Palpations: Abdomen is soft.     Tenderness: There is no abdominal tenderness.  Musculoskeletal:        General: No tenderness. Normal range of motion.     Cervical back: Normal range of motion and neck supple.  Skin:    General: Skin is warm and dry.  Neurological:     Mental Status: She is alert and oriented to  person, place, and time.     Cranial Nerves: No cranial nerve deficit.     Motor: Weakness present.     Deep Tendon Reflexes: Reflexes are normal and symmetric.     Comments: Left sided weakness  Psychiatric:        Behavior: Behavior normal.        Thought Content: Thought content normal.        Judgment: Judgment normal.       BP 99/60   Pulse 64   Temp (!) 97.5 F (36.4 C) (Temporal)   Ht 5\' 7"  (1.702 m)   Wt 157 lb (71.2 kg)    SpO2 99%   BMI 24.59 kg/m      Assessment & Plan:   Amanda Booker comes in today with chief complaint of Medical Management of Chronic Issues   Diagnosis and orders addressed:  1. Need for shingles vaccine - Zoster, Recombinant (Shingrix) - CMP14+EGFR - CBC with Differential/Platelet  2. Atherosclerosis of renal artery (HCC) - CMP14+EGFR - CBC with Differential/Platelet  3. Cigarette nicotine dependence without complication - CMP14+EGFR - CBC with Differential/Platelet  4. COPD (chronic obstructive pulmonary disease) with chronic bronchitis (HCC) - For home use only DME Nebulizer machine - CMP14+EGFR - CBC with Differential/Platelet - Ambulatory Referral Lung Cancer Screening [facility]  5. DM type 2 with diabetic dyslipidemia (HCC) - Bayer DCA Hb A1c Waived - CMP14+EGFR - CBC with Differential/Platelet - Microalbumin / creatinine urine ratio  6. Dyslipidemia - CMP14+EGFR - CBC with Differential/Platelet  7. Hemiparesis affecting left side as late effect of cerebrovascular accident (CVA) (HCC) - CMP14+EGFR - CBC with Differential/Platelet  8. Hypertension associated with diabetes (HCC) - lisinopril (ZESTRIL) 20 MG tablet; Take 1 tablet (20 mg total) by mouth daily.  Dispense: 90 tablet; Refill: 3 - CMP14+EGFR - CBC with Differential/Platelet  9. Nonrheumatic aortic valve stenosis - CMP14+EGFR - CBC with Differential/Platelet  10. Osteopenia, unspecified location - CMP14+EGFR - CBC with Differential/Platelet  11. Hypotension, unspecified hypotension type - CMP14+EGFR - CBC with Differential/Platelet  12. Screening for lung cancer - Ambulatory Referral Lung Cancer Screening [facility]  Stop hydrochlorothiazide 12.5 mg and decrease lisinopril to 20 mg from 40 mg -Dash diet information given -Exercise encouraged - Stress Management  -Continue current meds Labs pending Health Maintenance reviewed Diet and exercise  encouraged  Follow up plan: 2 weeks hypotension   Jannifer Rodney, FNP

## 2022-12-13 LAB — MICROALBUMIN / CREATININE URINE RATIO
Creatinine, Urine: 131.8 mg/dL
Microalb/Creat Ratio: 62 mg/g{creat} — ABNORMAL HIGH (ref 0–29)
Microalbumin, Urine: 82.3 ug/mL

## 2022-12-15 ENCOUNTER — Other Ambulatory Visit: Payer: Self-pay | Admitting: Family Medicine

## 2022-12-15 ENCOUNTER — Other Ambulatory Visit: Payer: Self-pay | Admitting: Family

## 2022-12-15 DIAGNOSIS — E875 Hyperkalemia: Secondary | ICD-10-CM

## 2022-12-15 MED ORDER — DAPAGLIFLOZIN PROPANEDIOL 5 MG PO TABS: 5.0000 mg | ORAL_TABLET | Freq: Every day | ORAL | 1 refills | Status: DC

## 2022-12-19 ENCOUNTER — Other Ambulatory Visit: Payer: Medicare HMO

## 2022-12-19 DIAGNOSIS — E875 Hyperkalemia: Secondary | ICD-10-CM | POA: Diagnosis not present

## 2022-12-19 LAB — BMP8+EGFR
BUN/Creatinine Ratio: 26 (ref 12–28)
BUN: 35 mg/dL — ABNORMAL HIGH (ref 8–27)
CO2: 21 mmol/L (ref 20–29)
Calcium: 11.1 mg/dL — ABNORMAL HIGH (ref 8.7–10.3)
Chloride: 102 mmol/L (ref 96–106)
Creatinine, Ser: 1.36 mg/dL — ABNORMAL HIGH (ref 0.57–1.00)
Glucose: 139 mg/dL — ABNORMAL HIGH (ref 70–99)
Potassium: 4.5 mmol/L (ref 3.5–5.2)
Sodium: 140 mmol/L (ref 134–144)
eGFR: 42 mL/min/{1.73_m2} — ABNORMAL LOW (ref >59)

## 2022-12-28 ENCOUNTER — Other Ambulatory Visit: Payer: Self-pay | Admitting: Family

## 2022-12-28 DIAGNOSIS — E1159 Type 2 diabetes mellitus with other circulatory complications: Secondary | ICD-10-CM

## 2022-12-29 ENCOUNTER — Ambulatory Visit (INDEPENDENT_AMBULATORY_CARE_PROVIDER_SITE_OTHER): Payer: Medicare HMO | Admitting: Family

## 2022-12-29 ENCOUNTER — Encounter: Payer: Self-pay | Admitting: Family

## 2022-12-29 VITALS — BP 122/66 | HR 65 | Temp 97.5°F | Ht 67.0 in | Wt 156.6 lb

## 2022-12-29 DIAGNOSIS — I69354 Hemiplegia and hemiparesis following cerebral infarction affecting left non-dominant side: Secondary | ICD-10-CM | POA: Diagnosis not present

## 2022-12-29 DIAGNOSIS — I152 Hypertension secondary to endocrine disorders: Secondary | ICD-10-CM

## 2022-12-29 DIAGNOSIS — E1159 Type 2 diabetes mellitus with other circulatory complications: Secondary | ICD-10-CM

## 2022-12-29 NOTE — Patient Instructions (Signed)
Hypertension, Adult High blood pressure (hypertension) is when the force of blood pumping through the arteries is too strong. The arteries are the blood vessels that carry blood from the heart throughout the body. Hypertension forces the heart to work harder to pump blood and may cause arteries to become narrow or stiff. Untreated or uncontrolled hypertension can lead to a heart attack, heart failure, a stroke, kidney disease, and other problems. A blood pressure reading consists of a higher number over a lower number. Ideally, your blood pressure should be below 120/80. The first ("top") number is called the systolic pressure. It is a measure of the pressure in your arteries as your heart beats. The second ("bottom") number is called the diastolic pressure. It is a measure of the pressure in your arteries as the heart relaxes. What are the causes? The exact cause of this condition is not known. There are some conditions that result in high blood pressure. What increases the risk? Certain factors may make you more likely to develop high blood pressure. Some of these risk factors are under your control, including: Smoking. Not getting enough exercise or physical activity. Being overweight. Having too much fat, sugar, calories, or salt (sodium) in your diet. Drinking too much alcohol. Other risk factors include: Having a personal history of heart disease, diabetes, high cholesterol, or kidney disease. Stress. Having a family history of high blood pressure and high cholesterol. Having obstructive sleep apnea. Age. The risk increases with age. What are the signs or symptoms? High blood pressure may not cause symptoms. Very high blood pressure (hypertensive crisis) may cause: Headache. Fast or irregular heartbeats (palpitations). Shortness of breath. Nosebleed. Nausea and vomiting. Vision changes. Severe chest pain, dizziness, and seizures. How is this diagnosed? This condition is diagnosed by  measuring your blood pressure while you are seated, with your arm resting on a flat surface, your legs uncrossed, and your feet flat on the floor. The cuff of the blood pressure monitor will be placed directly against the skin of your upper arm at the level of your heart. Blood pressure should be measured at least twice using the same arm. Certain conditions can cause a difference in blood pressure between your right and left arms. If you have a high blood pressure reading during one visit or you have normal blood pressure with other risk factors, you may be asked to: Return on a different day to have your blood pressure checked again. Monitor your blood pressure at home for 1 week or longer. If you are diagnosed with hypertension, you may have other blood or imaging tests to help your health care provider understand your overall risk for other conditions. How is this treated? This condition is treated by making healthy lifestyle changes, such as eating healthy foods, exercising more, and reducing your alcohol intake. You may be referred for counseling on a healthy diet and physical activity. Your health care provider may prescribe medicine if lifestyle changes are not enough to get your blood pressure under control and if: Your systolic blood pressure is above 130. Your diastolic blood pressure is above 80. Your personal target blood pressure may vary depending on your medical conditions, your age, and other factors. Follow these instructions at home: Eating and drinking  Eat a diet that is high in fiber and potassium, and low in sodium, added sugar, and fat. An example of this eating plan is called the DASH diet. DASH stands for Dietary Approaches to Stop Hypertension. To eat this way: Eat   plenty of fresh fruits and vegetables. Try to fill one half of your plate at each meal with fruits and vegetables. Eat whole grains, such as whole-wheat pasta, brown rice, or whole-grain bread. Fill about one  fourth of your plate with whole grains. Eat or drink low-fat dairy products, such as skim milk or low-fat yogurt. Avoid fatty cuts of meat, processed or cured meats, and poultry with skin. Fill about one fourth of your plate with lean proteins, such as fish, chicken without skin, beans, eggs, or tofu. Avoid pre-made and processed foods. These tend to be higher in sodium, added sugar, and fat. Reduce your daily sodium intake. Many people with hypertension should eat less than 1,500 mg of sodium a day. Do not drink alcohol if: Your health care provider tells you not to drink. You are pregnant, may be pregnant, or are planning to become pregnant. If you drink alcohol: Limit how much you have to: 0-1 drink a day for women. 0-2 drinks a day for men. Know how much alcohol is in your drink. In the [place] one drink equals one 12 oz bottle of beer (355 mL), one 5 oz glass of wine (148 mL), or one 1 oz glass of hard liquor (44 mL). Lifestyle  Work with your health care provider to maintain a healthy body weight or to lose weight. Ask what an ideal weight is for you. Get at least 30 minutes of exercise that causes your heart to beat faster (aerobic exercise) most days of the week. Activities may include walking, swimming, or biking. Include exercise to strengthen your muscles (resistance exercise), such as Pilates or lifting weights, as part of your weekly exercise routine. Try to do these types of exercises for 30 minutes at least 3 days a week. Do not use any products that contain nicotine or tobacco. These products include cigarettes, chewing tobacco, and vaping devices, such as e-cigarettes. If you need help quitting, ask your health care provider. Monitor your blood pressure at home as told by your health care provider. Keep all follow-up visits. This is important. Medicines Take over-the-counter and prescription medicines only as told by your health care provider. Follow directions carefully. Blood  pressure medicines must be taken as prescribed. Do not skip doses of blood pressure medicine. Doing this puts you at risk for problems and can make the medicine less effective. Ask your health care provider about side effects or reactions to medicines that you should watch for. Contact a health care provider if you: Think you are having a reaction to a medicine you are taking. Have headaches that keep coming back (recurring). Feel dizzy. Have swelling in your ankles. Have trouble with your vision. Get help right away if you: Develop a severe headache or confusion. Have unusual weakness or numbness. Feel faint. Have severe pain in your chest or abdomen. Vomit repeatedly. Have trouble breathing. These symptoms may be an emergency. Get help right away. Call 911. Do not wait to see if the symptoms will go away. Do not drive yourself to the hospital. Summary Hypertension is when the force of blood pumping through your arteries is too strong. If this condition is not controlled, it may put you at risk for serious complications. Your personal target blood pressure may vary depending on your medical conditions, your age, and other factors. For most people, a normal blood pressure is less than 120/80. Hypertension is treated with lifestyle changes, medicines, or a combination of both. Lifestyle changes include losing weight, eating a healthy,   low-sodium diet, exercising more, and limiting alcohol. This information is not intended to replace advice given to you by your health care provider. Make sure you discuss any questions you have with your health care provider. Document Revised: 02/15/2021 Document Reviewed: 02/15/2021 Elsevier Patient Education  2024 [employer]  

## 2022-12-29 NOTE — Progress Notes (Signed)
   Subjective:    Patient ID: Amanda Booker, female    DOB: 1950-07-16, 72 y.o.   MRN: [id number]  Chief Complaint  Patient presents with   Follow-up   PT presents to the office today to follow up on hypotension. She was seen on 12/12/22 and had a BP of 99/60. We stopped her hydrochlorothiazide 12.5 mg and decreased her lisinopril to 20 mg from 40 mg. She also had an elevated K+, but resolved on recheck.   Her BP today is elevated, but resolved on recheck.  Hypertension This is a chronic problem. The current episode started more than 1 year ago. The problem has been waxing and waning since onset. The problem is uncontrolled. Pertinent negatives include no malaise/fatigue, peripheral edema or shortness of breath. Risk factors for coronary artery disease include dyslipidemia, obesity and sedentary lifestyle. The current treatment provides moderate improvement.      Review of Systems  Constitutional:  Negative for malaise/fatigue.  Respiratory:  Negative for shortness of breath.   All other systems reviewed and are negative.      Objective:   Physical Exam Vitals reviewed.  Constitutional:      General: She is not in acute distress.    Appearance: She is well-developed.  HENT:     Head: Normocephalic and atraumatic.  Eyes:     Pupils: Pupils are equal, round, and reactive to light.  Neck:     Thyroid: No thyromegaly.  Cardiovascular:     Rate and Rhythm: Normal rate and regular rhythm.     Heart sounds: Normal heart sounds. No murmur heard. Pulmonary:     Effort: Pulmonary effort is normal. No respiratory distress.     Breath sounds: Normal breath sounds. No wheezing.  Abdominal:     General: Bowel sounds are normal. There is no distension.     Palpations: Abdomen is soft.     Tenderness: There is no abdominal tenderness.  Musculoskeletal:        General: No tenderness.     Cervical back: Normal range of motion and neck supple.  Skin:    General: Skin is warm and dry.   Neurological:     Mental Status: She is alert and oriented to person, place, and time.     Cranial Nerves: No cranial nerve deficit.     Motor: Weakness (left sided weakness) present.     Deep Tendon Reflexes: Reflexes are normal and symmetric.  Psychiatric:        Behavior: Behavior normal.        Thought Content: Thought content normal.        Judgment: Judgment normal.         BP 122/66   Pulse 65   Temp (!) 97.5 F (36.4 C) (Temporal)   Ht 5\' 7"  (1.702 m)   Wt 156 lb 9.6 oz (71 kg)   SpO2 96%   BMI 24.53 kg/m   Assessment & Plan:  Amanda Booker comes in today with chief complaint of Follow-up   Diagnosis and orders addressed:  1. Hemiparesis affecting left side as late effect of cerebrovascular accident (CVA) (HCC) - BMP8+EGFR  2. Hypertension associated with diabetes (HCC) - BMP8+EGFR  BP at goal today Continue current medications  Low salt diet  Labs pending Health Maintenance reviewed Diet and exercise encouraged  Follow up plan: 3 months    Jannifer Rodney, FNP

## 2022-12-30 LAB — BMP8+EGFR
BUN/Creatinine Ratio: 21 (ref 12–28)
BUN: 29 mg/dL — ABNORMAL HIGH (ref 8–27)
CO2: 16 mmol/L — ABNORMAL LOW (ref 20–29)
Calcium: 9.9 mg/dL (ref 8.7–10.3)
Chloride: 103 mmol/L (ref 96–106)
Creatinine, Ser: 1.35 mg/dL — ABNORMAL HIGH (ref 0.57–1.00)
Glucose: 91 mg/dL (ref 70–99)
Potassium: 5.2 mmol/L (ref 3.5–5.2)
Sodium: 138 mmol/L (ref 134–144)
eGFR: 42 mL/min/{1.73_m2} — ABNORMAL LOW (ref >59)

## 2023-01-01 DIAGNOSIS — H5213 Myopia, bilateral: Secondary | ICD-10-CM | POA: Diagnosis not present

## 2023-01-01 DIAGNOSIS — E119 Type 2 diabetes mellitus without complications: Secondary | ICD-10-CM | POA: Diagnosis not present

## 2023-01-01 DIAGNOSIS — H52223 Regular astigmatism, bilateral: Secondary | ICD-10-CM | POA: Diagnosis not present

## 2023-01-01 DIAGNOSIS — H524 Presbyopia: Secondary | ICD-10-CM | POA: Diagnosis not present

## 2023-01-01 DIAGNOSIS — H25813 Combined forms of age-related cataract, bilateral: Secondary | ICD-10-CM | POA: Diagnosis not present

## 2023-01-01 LAB — HM DIABETES EYE EXAM

## 2023-01-08 ENCOUNTER — Encounter: Payer: Self-pay | Admitting: Family

## 2023-01-21 ENCOUNTER — Other Ambulatory Visit: Payer: Self-pay | Admitting: Family

## 2023-01-21 DIAGNOSIS — I152 Hypertension secondary to endocrine disorders: Secondary | ICD-10-CM

## 2023-03-02 ENCOUNTER — Encounter: Payer: Self-pay | Admitting: Family

## 2023-03-27 ENCOUNTER — Other Ambulatory Visit: Payer: Self-pay | Admitting: Family

## 2023-03-27 DIAGNOSIS — E1159 Type 2 diabetes mellitus with other circulatory complications: Secondary | ICD-10-CM

## 2023-04-03 ENCOUNTER — Ambulatory Visit: Payer: Medicare HMO | Admitting: Family

## 2023-04-03 ENCOUNTER — Encounter: Payer: Self-pay | Admitting: Family

## 2023-04-03 VITALS — BP 135/74 | HR 68 | Temp 97.8°F | Resp 20 | Ht 67.0 in | Wt 158.8 lb

## 2023-04-03 DIAGNOSIS — E785 Hyperlipidemia, unspecified: Secondary | ICD-10-CM

## 2023-04-03 DIAGNOSIS — F17210 Nicotine dependence, cigarettes, uncomplicated: Secondary | ICD-10-CM | POA: Diagnosis not present

## 2023-04-03 DIAGNOSIS — I152 Hypertension secondary to endocrine disorders: Secondary | ICD-10-CM | POA: Diagnosis not present

## 2023-04-03 DIAGNOSIS — M8580 Other specified disorders of bone density and structure, unspecified site: Secondary | ICD-10-CM

## 2023-04-03 DIAGNOSIS — I701 Atherosclerosis of renal artery: Secondary | ICD-10-CM

## 2023-04-03 DIAGNOSIS — E1169 Type 2 diabetes mellitus with other specified complication: Secondary | ICD-10-CM

## 2023-04-03 DIAGNOSIS — I69354 Hemiplegia and hemiparesis following cerebral infarction affecting left non-dominant side: Secondary | ICD-10-CM

## 2023-04-03 DIAGNOSIS — E1159 Type 2 diabetes mellitus with other circulatory complications: Secondary | ICD-10-CM | POA: Diagnosis not present

## 2023-04-03 DIAGNOSIS — J4489 Other specified chronic obstructive pulmonary disease: Secondary | ICD-10-CM | POA: Diagnosis not present

## 2023-04-03 LAB — CBC WITH DIFFERENTIAL/PLATELET
Basophils Absolute: 0.1 10*3/uL (ref 0.0–0.2)
Basos: 1 %
EOS (ABSOLUTE): 0.3 10*3/uL (ref 0.0–0.4)
Eos: 4 %
Hematocrit: 39.4 % (ref 34.0–46.6)
Hemoglobin: 12.7 g/dL (ref 11.1–15.9)
Immature Grans (Abs): 0 10*3/uL (ref 0.0–0.1)
Immature Granulocytes: 0 %
Lymphocytes Absolute: 1.6 10*3/uL (ref 0.7–3.1)
Lymphs: 22 %
MCH: 29.3 pg (ref 26.6–33.0)
MCHC: 32.2 g/dL (ref 31.5–35.7)
MCV: 91 fL (ref 79–97)
Monocytes Absolute: 0.5 10*3/uL (ref 0.1–0.9)
Monocytes: 7 %
Neutrophils Absolute: 4.7 10*3/uL (ref 1.4–7.0)
Neutrophils: 66 %
Platelets: 313 10*3/uL (ref 150–450)
RBC: 4.34 x10E6/uL (ref 3.77–5.28)
RDW: 11.7 % (ref 11.7–15.4)
WBC: 7.1 10*3/uL (ref 3.4–10.8)

## 2023-04-03 LAB — BAYER DCA HB A1C WAIVED: HB A1C (BAYER DCA - WAIVED): 7 % — ABNORMAL HIGH (ref 4.8–5.6)

## 2023-04-03 LAB — CMP14+EGFR
ALT: 11 [IU]/L (ref 0–32)
AST: 13 [IU]/L (ref 0–40)
Albumin: 4.1 g/dL (ref 3.9–4.9)
Alkaline Phosphatase: 74 [IU]/L (ref 44–121)
BUN/Creatinine Ratio: 23 (ref 12–28)
BUN: 29 mg/dL — ABNORMAL HIGH (ref 8–27)
Bilirubin Total: 0.5 mg/dL (ref 0.0–1.2)
CO2: 20 mmol/L (ref 20–29)
Calcium: 10.1 mg/dL (ref 8.7–10.3)
Chloride: 106 mmol/L (ref 96–106)
Creatinine, Ser: 1.28 mg/dL — ABNORMAL HIGH (ref 0.57–1.00)
Globulin, Total: 2.1 g/dL (ref 1.5–4.5)
Glucose: 122 mg/dL — ABNORMAL HIGH (ref 70–99)
Potassium: 5 mmol/L (ref 3.5–5.2)
Sodium: 139 mmol/L (ref 134–144)
Total Protein: 6.2 g/dL (ref 6.0–8.5)
eGFR: 45 mL/min/{1.73_m2} — ABNORMAL LOW (ref >59)

## 2023-04-03 NOTE — Patient Instructions (Signed)
Health Maintenance After Age 70 After age 70, you are at a higher risk for certain long-term diseases and infections as well as injuries from falls. Falls are a major cause of broken bones and head injuries in people who are older than age 70. Getting regular preventive care can help to keep you healthy and well. Preventive care includes getting regular testing and making lifestyle changes as recommended by your health care provider. Talk with your health care provider about: Which screenings and tests you should have. A screening is a test that checks for a disease when you have no symptoms. A diet and exercise plan that is right for you. What should I know about screenings and tests to prevent falls? Screening and testing are the best ways to find a health problem early. Early diagnosis and treatment give you the best chance of managing medical conditions that are common after age 70. Certain conditions and lifestyle choices may make you more likely to have a fall. Your health care provider may recommend: Regular vision checks. Poor vision and conditions such as cataracts can make you more likely to have a fall. If you wear glasses, make sure to get your prescription updated if your vision changes. Medicine review. Work with your health care provider to regularly review all of the medicines you are taking, including over-the-counter medicines. Ask your health care provider about any side effects that may make you more likely to have a fall. Tell your health care provider if any medicines that you take make you feel dizzy or sleepy. Strength and balance checks. Your health care provider may recommend certain tests to check your strength and balance while standing, walking, or changing positions. Foot health exam. Foot pain and numbness, as well as not wearing proper footwear, can make you more likely to have a fall. Screenings, including: Osteoporosis screening. Osteoporosis is a condition that causes  the bones to get weaker and break more easily. Blood pressure screening. Blood pressure changes and medicines to control blood pressure can make you feel dizzy. Depression screening. You may be more likely to have a fall if you have a fear of falling, feel depressed, or feel unable to do activities that you used to do. Alcohol use screening. Using too much alcohol can affect your balance and may make you more likely to have a fall. Follow these instructions at home: Lifestyle Do not drink alcohol if: Your health care provider tells you not to drink. If you drink alcohol: Limit how much you have to: 0-1 drink a day for women. 0-2 drinks a day for men. Know how much alcohol is in your drink. In the [place] one drink equals one 12 oz bottle of beer (355 mL), one 5 oz glass of wine (148 mL), or one 1 oz glass of hard liquor (44 mL). Do not use any products that contain nicotine or tobacco. These products include cigarettes, chewing tobacco, and vaping devices, such as e-cigarettes. If you need help quitting, ask your health care provider. Activity  Follow a regular exercise program to stay fit. This will help you maintain your balance. Ask your health care provider what types of exercise are appropriate for you. If you need a cane or walker, use it as recommended by your health care provider. Wear supportive shoes that have nonskid soles. Safety  Remove any tripping hazards, such as rugs, cords, and clutter. Install safety equipment such as grab bars in bathrooms and safety rails on stairs. Keep rooms and walkways   well-lit. General instructions Talk with your health care provider about your risks for falling. Tell your health care provider if: You fall. Be sure to tell your health care provider about all falls, even ones that seem minor. You feel dizzy, tiredness (fatigue), or off-balance. Take over-the-counter and prescription medicines only as told by your health care provider. These include  supplements. Eat a healthy diet and maintain a healthy weight. A healthy diet includes low-fat dairy products, low-fat (lean) meats, and fiber from whole grains, beans, and lots of fruits and vegetables. Stay current with your vaccines. Schedule regular health, dental, and eye exams. Summary Having a healthy lifestyle and getting preventive care can help to protect your health and wellness after age 70. Screening and testing are the best way to find a health problem early and help you avoid having a fall. Early diagnosis and treatment give you the best chance for managing medical conditions that are more common for people who are older than age 70. Falls are a major cause of broken bones and head injuries in people who are older than age 70. Take precautions to prevent a fall at home. Work with your health care provider to learn what changes you can make to improve your health and wellness and to prevent falls. This information is not intended to replace advice given to you by your health care provider. Make sure you discuss any questions you have with your health care provider. Document Revised: 08/30/2020 Document Reviewed: 08/30/2020 Elsevier Patient Education  2024 [employer]  

## 2023-04-03 NOTE — Progress Notes (Signed)
Subjective:    Patient ID: Amanda Booker, female    DOB: 1951/01/17, 72 y.o.   MRN: [id number]  Chief Complaint  Patient presents with   Medical Management of Chronic Issues   PT presents to the office today for chronic follow up. 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    She is followed by Cardiologists annually for HTN, aortic valve stenosis, and hyperlipidemia.    She has COPD and denies any SOB. She continues to smoke 1 pack a day.    She has atherosclerosis and takes Lipitor. Doing well.    PT complaining of bilateral leg cramps at night.    She has osteopenia and taking calcium and vit D daily. Last Dexa scan 08/11/22. Hypertension This is a chronic problem. The current episode started more than 1 year ago. The problem has been resolved since onset. The problem is controlled. Pertinent negatives include no blurred vision, malaise/fatigue, peripheral edema or shortness of breath. Risk factors for coronary artery disease include diabetes mellitus, dyslipidemia and sedentary lifestyle. The current treatment provides moderate improvement.  Diabetes She presents for her follow-up diabetic visit. She has type 2 diabetes mellitus. Pertinent negatives for diabetes include no blurred vision and no foot paresthesias. Symptoms are stable. Risk factors for coronary artery disease include dyslipidemia, diabetes mellitus, hypertension, sedentary lifestyle and post-menopausal. She is following a generally healthy diet. Her overall blood glucose range is 130-140 mg/dl. Eye exam is current.  Hyperlipidemia This is a chronic problem. The current episode started more than 1 year ago. The problem is controlled. Exacerbating diseases include obesity. Pertinent negatives include no shortness of breath. Current antihyperlipidemic treatment includes statins. The current treatment provides moderate improvement of lipids. Risk factors for coronary artery disease include dyslipidemia, diabetes  mellitus, hypertension, a sedentary lifestyle and post-menopausal.  Nicotine Dependence Presents for follow-up visit. Her urge triggers include company of smokers. The symptoms have been stable. She smokes 1 pack of cigarettes per day.      Review of Systems  Constitutional:  Negative for malaise/fatigue.  Eyes:  Negative for blurred vision.  Respiratory:  Negative for shortness of breath.   All other systems reviewed and are negative.      Objective:   Physical Exam Vitals reviewed.  Constitutional:      General: She is not in acute distress.    Appearance: She is well-developed.  HENT:     Head: Normocephalic and atraumatic.     Right Ear: Tympanic membrane normal.     Left Ear: Tympanic membrane normal.  Eyes:     Pupils: Pupils are equal, round, and reactive to light.  Neck:     Thyroid: No thyromegaly.  Cardiovascular:     Rate and Rhythm: Normal rate and regular rhythm.     Heart sounds: Normal heart sounds. No murmur heard. Pulmonary:     Effort: Pulmonary effort is normal. No respiratory distress.     Breath sounds: Normal breath sounds. No wheezing.  Abdominal:     General: Bowel sounds are normal. There is no distension.     Palpations: Abdomen is soft.     Tenderness: There is no abdominal tenderness.  Musculoskeletal:        General: No tenderness. Normal range of motion.     Cervical back: Normal range of motion and neck supple.  Skin:    General: Skin is warm and dry.  Neurological:     Mental Status: She is alert and  oriented to person, place, and time.     Cranial Nerves: No cranial nerve deficit.     Motor: Weakness present.     Deep Tendon Reflexes: Reflexes are normal and symmetric.     Comments: Right sided weakness  Psychiatric:        Behavior: Behavior normal.        Thought Content: Thought content normal.        Judgment: Judgment normal.       BP 135/74   Pulse 68   Temp 97.8 F (36.6 C) (Temporal)   Resp 20   Ht 5\' 7"  (1.702  m)   Wt 158 lb 12.8 oz (72 kg)   SpO2 95%   BMI 24.87 kg/m      Assessment & Plan:  Amanda Booker comes in today with chief complaint of Medical Management of Chronic Issues   Diagnosis and orders addressed:  1. DM type 2 with diabetic dyslipidemia (HCC) - Bayer DCA Hb A1c Waived - CBC with Differential/Platelet - CMP14+EGFR  2. Atherosclerosis of renal artery (HCC) - CBC with Differential/Platelet - CMP14+EGFR  3. COPD (chronic obstructive pulmonary disease) with chronic bronchitis (HCC) - CBC with Differential/Platelet - CMP14+EGFR  4. Hypertension associated with diabetes (HCC) - CBC with Differential/Platelet - CMP14+EGFR  5. Cigarette nicotine dependence without complication - CBC with Differential/Platelet - CMP14+EGFR  6. Hemiparesis affecting left side as late effect of cerebrovascular accident (CVA) (HCC) - CBC with Differential/Platelet - CMP14+EGFR  7. Osteopenia, unspecified location - CBC with Differential/Platelet - CMP14+EGFR  8. Dyslipidemia - CBC with Differential/Platelet - CMP14+EGFR    Labs pending Continue current medications  Health Maintenance reviewed Diet and exercise encouraged  Follow up plan: 6 months    Jannifer Rodney, FNP

## 2023-04-03 NOTE — Telephone Encounter (Signed)
Notes received from the eye exam.

## 2023-04-18 ENCOUNTER — Other Ambulatory Visit: Payer: Self-pay | Admitting: Family

## 2023-04-18 DIAGNOSIS — E1159 Type 2 diabetes mellitus with other circulatory complications: Secondary | ICD-10-CM

## 2023-04-19 ENCOUNTER — Other Ambulatory Visit: Payer: Self-pay | Admitting: Family

## 2023-04-19 DIAGNOSIS — I152 Hypertension secondary to endocrine disorders: Secondary | ICD-10-CM

## 2023-04-19 DIAGNOSIS — E1169 Type 2 diabetes mellitus with other specified complication: Secondary | ICD-10-CM

## 2023-06-04 MED ORDER — LANCETS MISC. MISC: 1.0000 | Freq: Three times a day (TID) | 0 refills | Status: AC

## 2023-06-04 MED ORDER — BLOOD GLUCOSE MONITORING SUPPL DEVI: 1.0000 | Freq: Three times a day (TID) | 0 refills | Status: AC

## 2023-06-04 MED ORDER — BLOOD GLUCOSE TEST VI STRP: 1.0000 | ORAL_STRIP | Freq: Three times a day (TID) | 0 refills | Status: AC

## 2023-06-04 MED ORDER — LANCET DEVICE MISC: 1.0000 | Freq: Three times a day (TID) | 0 refills | Status: AC

## 2023-06-10 ENCOUNTER — Other Ambulatory Visit: Payer: Self-pay | Admitting: Family

## 2023-06-10 DIAGNOSIS — E1169 Type 2 diabetes mellitus with other specified complication: Secondary | ICD-10-CM

## 2023-07-31 ENCOUNTER — Other Ambulatory Visit: Payer: Self-pay | Admitting: Family Medicine

## 2023-07-31 MED ORDER — ONETOUCH VERIO FLEX SYSTEM W/DEVICE KIT: PACK | 0 refills | Status: AC

## 2023-08-06 ENCOUNTER — Other Ambulatory Visit: Payer: Self-pay | Admitting: Family

## 2023-08-06 DIAGNOSIS — E1159 Type 2 diabetes mellitus with other circulatory complications: Secondary | ICD-10-CM

## 2023-09-13 ENCOUNTER — Other Ambulatory Visit: Payer: Self-pay | Admitting: Family

## 2023-09-13 DIAGNOSIS — E1159 Type 2 diabetes mellitus with other circulatory complications: Secondary | ICD-10-CM

## 2023-09-13 DIAGNOSIS — E1169 Type 2 diabetes mellitus with other specified complication: Secondary | ICD-10-CM

## 2023-10-04 ENCOUNTER — Encounter: Payer: Self-pay | Admitting: Family

## 2023-10-04 ENCOUNTER — Ambulatory Visit: Payer: Medicare HMO | Admitting: Family

## 2023-10-04 VITALS — BP 115/78 | HR 62 | Temp 98.1°F | Ht 66.0 in | Wt 154.4 lb

## 2023-10-04 DIAGNOSIS — E1159 Type 2 diabetes mellitus with other circulatory complications: Secondary | ICD-10-CM

## 2023-10-04 DIAGNOSIS — M8580 Other specified disorders of bone density and structure, unspecified site: Secondary | ICD-10-CM

## 2023-10-04 DIAGNOSIS — I152 Hypertension secondary to endocrine disorders: Secondary | ICD-10-CM | POA: Diagnosis not present

## 2023-10-04 DIAGNOSIS — F17210 Nicotine dependence, cigarettes, uncomplicated: Secondary | ICD-10-CM | POA: Diagnosis not present

## 2023-10-04 DIAGNOSIS — R410 Disorientation, unspecified: Secondary | ICD-10-CM

## 2023-10-04 DIAGNOSIS — I69354 Hemiplegia and hemiparesis following cerebral infarction affecting left non-dominant side: Secondary | ICD-10-CM

## 2023-10-04 DIAGNOSIS — E1169 Type 2 diabetes mellitus with other specified complication: Secondary | ICD-10-CM | POA: Diagnosis not present

## 2023-10-04 DIAGNOSIS — Z0000 Encounter for general adult medical examination without abnormal findings: Secondary | ICD-10-CM | POA: Diagnosis not present

## 2023-10-04 DIAGNOSIS — I701 Atherosclerosis of renal artery: Secondary | ICD-10-CM

## 2023-10-04 DIAGNOSIS — Z0001 Encounter for general adult medical examination with abnormal findings: Secondary | ICD-10-CM | POA: Diagnosis not present

## 2023-10-04 DIAGNOSIS — J4489 Other specified chronic obstructive pulmonary disease: Secondary | ICD-10-CM

## 2023-10-04 DIAGNOSIS — E785 Hyperlipidemia, unspecified: Secondary | ICD-10-CM

## 2023-10-04 LAB — MICROSCOPIC EXAMINATION: WBC, UA: >30 /HPF — AB (ref 0–5)

## 2023-10-04 LAB — URINALYSIS, COMPLETE
Bilirubin, UA: NEGATIVE
Ketones, UA: NEGATIVE
Nitrite, UA: NEGATIVE
Specific Gravity, UA: 1.015 (ref 1.005–1.030)
Urobilinogen, Ur: 0.2 mg/dL (ref 0.2–1.0)
pH, UA: 5.5 (ref 5.0–7.5)

## 2023-10-04 LAB — BAYER DCA HB A1C WAIVED: HB A1C (BAYER DCA - WAIVED): 6.5 % — ABNORMAL HIGH (ref 4.8–5.6)

## 2023-10-04 MED ORDER — TRELEGY ELLIPTA 100-62.5-25 MCG/ACT IN AEPB: 1.0000 | INHALATION_SPRAY | Freq: Every day | RESPIRATORY_TRACT | 11 refills | Status: DC

## 2023-10-04 MED ORDER — LISINOPRIL 20 MG PO TABS
20.0000 mg | ORAL_TABLET | Freq: Every day | ORAL | 0 refills | Status: DC
Start: 2023-10-04 — End: 2024-01-07

## 2023-10-04 MED ORDER — CLONIDINE HCL 0.3 MG PO TABS: 0.3000 mg | ORAL_TABLET | Freq: Two times a day (BID) | ORAL | 0 refills | Status: DC

## 2023-10-04 MED ORDER — METFORMIN HCL 1000 MG PO TABS: 1000.0000 mg | ORAL_TABLET | Freq: Two times a day (BID) | ORAL | 3 refills | Status: DC

## 2023-10-04 MED ORDER — METOPROLOL SUCCINATE ER 50 MG PO TB24: 50.0000 mg | ORAL_TABLET | Freq: Every day | ORAL | 1 refills | Status: DC

## 2023-10-04 MED ORDER — AMLODIPINE BESYLATE 10 MG PO TABS: 10.0000 mg | ORAL_TABLET | Freq: Every morning | ORAL | 1 refills | Status: DC

## 2023-10-04 MED ORDER — ALBUTEROL SULFATE (2.5 MG/3ML) 0.083% IN NEBU: 2.5000 mg | INHALATION_SOLUTION | RESPIRATORY_TRACT | 2 refills | Status: AC | PRN

## 2023-10-04 MED ORDER — ALBUTEROL SULFATE HFA 108 (90 BASE) MCG/ACT IN AERS: 2.0000 | INHALATION_SPRAY | Freq: Four times a day (QID) | RESPIRATORY_TRACT | 11 refills | Status: DC | PRN

## 2023-10-04 MED ORDER — GLIPIZIDE 5 MG PO TABS: 5.0000 mg | ORAL_TABLET | Freq: Two times a day (BID) | ORAL | 3 refills | Status: DC

## 2023-10-04 MED ORDER — ATORVASTATIN CALCIUM 40 MG PO TABS: 40.0000 mg | ORAL_TABLET | Freq: Every morning | ORAL | 1 refills | Status: DC

## 2023-10-04 MED ORDER — SPIRONOLACTONE 25 MG PO TABS: 25.0000 mg | ORAL_TABLET | Freq: Every day | ORAL | 1 refills | Status: DC

## 2023-10-04 MED ORDER — DAPAGLIFLOZIN PROPANEDIOL 5 MG PO TABS: 5.0000 mg | ORAL_TABLET | Freq: Every day | ORAL | 0 refills | Status: DC

## 2023-10-04 NOTE — Patient Instructions (Signed)
 Health Maintenance After Age 71 After age 4, you are at a higher risk for certain long-term diseases and infections as well as injuries from falls. Falls are a major cause of broken bones and head injuries in people who are older than age 47. Getting regular preventive care can help to keep you healthy and well. Preventive care includes getting regular testing and making lifestyle changes as recommended by your health care provider. Talk with your health care provider about: Which screenings and tests you should have. A screening is a test that checks for a disease when you have no symptoms. A diet and exercise plan that is right for you. What should I know about screenings and tests to prevent falls? Screening and testing are the best ways to find a health problem early. Early diagnosis and treatment give you the best chance of managing medical conditions that are common after age 37. Certain conditions and lifestyle choices may make you more likely to have a fall. Your health care provider may recommend: Regular vision checks. Poor vision and conditions such as cataracts can make you more likely to have a fall. If you wear glasses, make sure to get your prescription updated if your vision changes. Medicine review. Work with your health care provider to regularly review all of the medicines you are taking, including over-the-counter medicines. Ask your health care provider about any side effects that may make you more likely to have a fall. Tell your health care provider if any medicines that you take make you feel dizzy or sleepy. Strength and balance checks. Your health care provider may recommend certain tests to check your strength and balance while standing, walking, or changing positions. Foot health exam. Foot pain and numbness, as well as not wearing proper footwear, can make you more likely to have a fall. Screenings, including: Osteoporosis screening. Osteoporosis is a condition that causes  the bones to get weaker and break more easily. Blood pressure screening. Blood pressure changes and medicines to control blood pressure can make you feel dizzy. Depression screening. You may be more likely to have a fall if you have a fear of falling, feel depressed, or feel unable to do activities that you used to do. Alcohol use screening. Using too much alcohol can affect your balance and may make you more likely to have a fall. Follow these instructions at home: Lifestyle Do not drink alcohol if: Your health care provider tells you not to drink. If you drink alcohol: Limit how much you have to: 0-1 drink a day for women. 0-2 drinks a day for men. Know how much alcohol is in your drink. In the [place] one drink equals one 12 oz bottle of beer (355 mL), one 5 oz glass of wine (148 mL), or one 1 oz glass of hard liquor (44 mL). Do not use any products that contain nicotine or tobacco. These products include cigarettes, chewing tobacco, and vaping devices, such as e-cigarettes. If you need help quitting, ask your health care provider. Activity  Follow a regular exercise program to stay fit. This will help you maintain your balance. Ask your health care provider what types of exercise are appropriate for you. If you need a cane or walker, use it as recommended by your health care provider. Wear supportive shoes that have nonskid soles. Safety  Remove any tripping hazards, such as rugs, cords, and clutter. Install safety equipment such as grab bars in bathrooms and safety rails on stairs. Keep rooms and walkways  well-lit. General instructions Talk with your health care provider about your risks for falling. Tell your health care provider if: You fall. Be sure to tell your health care provider about all falls, even ones that seem minor. You feel dizzy, tiredness (fatigue), or off-balance. Take over-the-counter and prescription medicines only as told by your health care provider. These include  supplements. Eat a healthy diet and maintain a healthy weight. A healthy diet includes low-fat dairy products, low-fat (lean) meats, and fiber from whole grains, beans, and lots of fruits and vegetables. Stay current with your vaccines. Schedule regular health, dental, and eye exams. Summary Having a healthy lifestyle and getting preventive care can help to protect your health and wellness after age 11. Screening and testing are the best way to find a health problem early and help you avoid having a fall. Early diagnosis and treatment give you the best chance for managing medical conditions that are more common for people who are older than age 28. Falls are a major cause of broken bones and head injuries in people who are older than age 48. Take precautions to prevent a fall at home. Work with your health care provider to learn what changes you can make to improve your health and wellness and to prevent falls. This information is not intended to replace advice given to you by your health care provider. Make sure you discuss any questions you have with your health care provider. Document Revised: 08/30/2020 Document Reviewed: 08/30/2020 Elsevier Patient Education  2024 [employer].

## 2023-10-04 NOTE — Progress Notes (Signed)
 Subjective:    Patient ID: Amanda Booker, female    DOB: 1950-09-08, 73 y.o.   MRN: [id number]  Chief Complaint  Patient presents with   Medical Management of Chronic Issues   PT presents to the office today for CPE and chronic follow up. 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    She is followed by Cardiologists annually for HTN, aortic valve stenosis, and hyperlipidemia.    She has COPD and denies any SOB. She continues to smoke 1 pack a day.    She has atherosclerosis and takes Lipitor. Doing well.    She has osteopenia and taking calcium  and vit D daily. Last Dexa scan 08/11/22.   Daughter reports two different times she had thought someone was in the home when there wasn't.  Hypertension This is a chronic problem. The current episode started more than 1 year ago. The problem has been resolved since onset. The problem is controlled. Pertinent negatives include no blurred vision, malaise/fatigue, peripheral edema or shortness of breath. Risk factors for coronary artery disease include diabetes mellitus, dyslipidemia and sedentary lifestyle. The current treatment provides moderate improvement.  Diabetes She presents for her follow-up diabetic visit. She has type 2 diabetes mellitus. Pertinent negatives for diabetes include no blurred vision and no foot paresthesias. Symptoms are stable. Risk factors for coronary artery disease include dyslipidemia, diabetes mellitus, hypertension, sedentary lifestyle and post-menopausal. She is following a generally healthy diet. Her overall blood glucose range is 110-130 mg/dl. Eye exam is current.  Hyperlipidemia This is a chronic problem. The current episode started more than 1 year ago. The problem is controlled. Recent lipid tests were reviewed and are normal. Exacerbating diseases include obesity. Pertinent negatives include no shortness of breath. Current antihyperlipidemic treatment includes statins. The current treatment provides  moderate improvement of lipids. Risk factors for coronary artery disease include dyslipidemia, diabetes mellitus, hypertension, a sedentary lifestyle and post-menopausal.  Nicotine  Dependence Presents for follow-up visit. The symptoms have been stable. She smokes < 1/2 a pack of cigarettes per day.      Review of Systems  Constitutional:  Negative for malaise/fatigue.  Eyes:  Negative for blurred vision.  Respiratory:  Negative for shortness of breath.   All other systems reviewed and are negative.  Family History  Problem Relation Age of Onset   Cancer Mother        uterine   Hyperlipidemia Mother    Cerebral aneurysm Sister    Hypertension Sister    Kidney failure Sister    Diabetes Brother    Hypertension Brother    Diabetes Brother    Hypertension Brother    Breast cancer Neg Hx    Social History   Socioeconomic History   Marital status: Widowed    Spouse name: Not on file   Number of children: 3   Years of education: Not on file   Highest education level: 9th grade  Occupational History   Occupation: Designer, fashion/clothing    Comment: retired  Tobacco Use   Smoking status: Every Day    Current packs/day: 0.75    Average packs/day: 0.8 packs/day for 60.0 years (45.0 ttl pk-yrs)    Types: Cigarettes   Smokeless tobacco: Never  Vaping Use   Vaping status: Never Used  Substance and Sexual Activity   Alcohol use: No   Drug use: No   Sexual activity: Not Currently  Other Topics Concern   Not on file  Social History Narrative   **  Merged History Encounter **       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 She has 3 grown children that she sees regularly. She reports that she is not physically active. She does attend church when she is able and she enjoys crossword puzzles and word searches.        Social Drivers of Corporate investment banker Strain: Low Risk  (08/02/2022)   Overall Financial Resource Strain (CARDIA)    Difficulty of Paying Living  Expenses: Not hard at all  Food Insecurity: No Food Insecurity (08/02/2022)   Hunger Vital Sign    Worried About Running Out of Food in the Last Year: Never true    Ran Out of Food in the Last Year: Never true  Transportation Needs: No Transportation Needs (08/02/2022)   PRAPARE - Administrator, Civil Service (Medical): No    Lack of Transportation (Non-Medical): No  Physical Activity: Inactive (08/02/2022)   Exercise Vital Sign    Days of Exercise per Week: 0 days    Minutes of Exercise per Session: 0 min  Stress: No Stress Concern Present (08/02/2022)   [employer] of Occupational Health - Occupational Stress Questionnaire    Feeling of Stress : Not at all  Social Connections: Socially Isolated (08/02/2022)   Social Connection and Isolation Panel    Frequency of Communication with Friends and Family: More than three times a week    Frequency of Social Gatherings with Friends and Family: More than three times a week    Attends Religious Services: Never    Database administrator or Organizations: No    Attends Banker Meetings: Never    Marital Status: Widowed       Objective:   Physical Exam Vitals reviewed.  Constitutional:      General: She is not in acute distress.    Appearance: She is well-developed.  HENT:     Head: Normocephalic and atraumatic.     Right Ear: Tympanic membrane normal.     Left Ear: Tympanic membrane normal.   Eyes:     Pupils: Pupils are equal, round, and reactive to light.   Neck:     Thyroid : No thyromegaly.   Cardiovascular:     Rate and Rhythm: Normal rate and regular rhythm.     Heart sounds: Normal heart sounds. No murmur heard. Pulmonary:     Effort: Pulmonary effort is normal. No respiratory distress.     Breath sounds: Normal breath sounds. No wheezing.  Abdominal:     General: Bowel sounds are normal. There is no distension.     Palpations: Abdomen is soft.     Tenderness: There is no abdominal  tenderness.   Musculoskeletal:        General: No tenderness. Normal range of motion.     Cervical back: Normal range of motion and neck supple.   Skin:    General: Skin is warm and dry.   Neurological:     Mental Status: She is alert and oriented to person, place, and time.     Cranial Nerves: No cranial nerve deficit.     Motor: Weakness present.     Deep Tendon Reflexes: Reflexes are normal and symmetric.     Comments: left sided weakness  Psychiatric:        Behavior: Behavior normal.        Thought Content: Thought content normal.  Judgment: Judgment normal.     Diabetic Foot Exam - Simple   Simple Foot Form Diabetic Foot exam was performed with the following findings: Yes 10/04/2023 10:34 AM  Visual Inspection No deformities, no ulcerations, no other skin breakdown bilaterally: Yes Sensation Testing Intact to touch and monofilament testing bilaterally: Yes Pulse Check Posterior Tibialis and Dorsalis pulse intact bilaterally: Yes Comments Toenails thick and discolored, short      BP 115/78   Pulse 62   Temp 98.1 F (36.7 C) (Temporal)   Ht 5' 6 (1.676 m)   Wt 154 lb 6.4 oz (70 kg)   SpO2 94%   BMI 24.92 kg/m      Assessment & Plan:  Eliannah Hinde comes in today with chief complaint of Medical Management of Chronic Issues   Diagnosis and orders addressed:  1. COPD (chronic obstructive pulmonary disease) with chronic bronchitis (HCC) - albuterol  (PROVENTIL ) (2.5 MG/3ML) 0.083% nebulizer solution; Take 3 mLs (2.5 mg total) by nebulization every 4 (four) hours as needed for wheezing or shortness of breath.  Dispense: 75 mL; Refill: 2 - albuterol  (VENTOLIN  HFA) 108 (90 Base) MCG/ACT inhaler; Inhale 2 puffs into the lungs every 6 (six) hours as needed for wheezing or shortness of breath.  Dispense: 18 g; Refill: 11 - Fluticasone -Umeclidin-Vilant (TRELEGY ELLIPTA ) 100-62.5-25 MCG/ACT AEPB; Inhale 1 puff into the lungs daily.  Dispense: 1 each; Refill:  11 - CMP14+EGFR - CBC with Differential/Platelet  2. Hypertension associated with diabetes (HCC) - amLODipine  (NORVASC ) 10 MG tablet; Take 1 tablet (10 mg total) by mouth every morning.  Dispense: 90 tablet; Refill: 1 - cloNIDine  (CATAPRES ) 0.3 MG tablet; Take 1 tablet (0.3 mg total) by mouth 2 (two) times daily.  Dispense: 180 tablet; Refill: 0 - lisinopril  (ZESTRIL ) 20 MG tablet; Take 1 tablet (20 mg total) by mouth daily.  Dispense: 90 tablet; Refill: 0 - metoprolol  succinate (TOPROL -XL) 50 MG 24 hr tablet; Take 1 tablet (50 mg total) by mouth daily. Take with or immediately following a meal.  Dispense: 180 tablet; Refill: 1 - spironolactone  (ALDACTONE ) 25 MG tablet; Take 1 tablet (25 mg total) by mouth daily.  Dispense: 90 tablet; Refill: 1 - CMP14+EGFR - CBC with Differential/Platelet  3. Hyperlipidemia associated with type 2 diabetes mellitus (HCC) - atorvastatin  (LIPITOR) 40 MG tablet; Take 1 tablet (40 mg total) by mouth every morning.  Dispense: 90 tablet; Refill: 1 - CMP14+EGFR - CBC with Differential/Platelet - Lipid panel  4. DM type 2 with diabetic dyslipidemia (HCC) - dapagliflozin  propanediol (FARXIGA ) 5 MG TABS tablet; Take 1 tablet (5 mg total) by mouth daily before breakfast.  Dispense: 90 tablet; Refill: 0 - metFORMIN  (GLUCOPHAGE ) 1000 MG tablet; Take 1 tablet (1,000 mg total) by mouth 2 (two) times daily with a meal.  Dispense: 180 tablet; Refill: 3 - glipiZIDE  (GLUCOTROL ) 5 MG tablet; Take 1 tablet (5 mg total) by mouth 2 (two) times daily before a meal.  Dispense: 180 tablet; Refill: 3 - CMP14+EGFR - CBC with Differential/Platelet - TSH - Bayer DCA Hb A1c Waived  5. Atherosclerosis of renal artery (HCC) - CMP14+EGFR - CBC with Differential/Platelet  6. Cigarette nicotine  dependence without complication - CMP14+EGFR - CBC with Differential/Platelet  7. Hemiparesis affecting left side as late effect of cerebrovascular accident (CVA) (HCC)  - CMP14+EGFR - CBC  with Differential/Platelet  8. Dyslipidemia - CMP14+EGFR - CBC with Differential/Platelet  9. Osteopenia, unspecified location  - CMP14+EGFR - CBC with Differential/Platelet  10. Annual physical exam (Primary) - CMP14+EGFR -  CBC with Differential/Platelet - Lipid panel - TSH - Bayer DCA Hb A1c Waived - Urinalysis, Complete  11. Delirium - CMP14+EGFR - CBC with Differential/Platelet - TSH - Urinalysis, Complete   Labs pending Will decrease glipized to 5 mg from 10 mg BID, may d/c this on next visit if still having lows.  Continue current medications  Health Maintenance reviewed Diet and exercise encouraged  Follow up plan: 3 months    Tommas Fragmin, FNP

## 2023-10-05 ENCOUNTER — Ambulatory Visit: Payer: Self-pay | Admitting: Family

## 2023-10-05 LAB — CMP14+EGFR
ALT: 8 IU/L (ref 0–32)
AST: 11 IU/L (ref 0–40)
Albumin: 4.3 g/dL (ref 3.8–4.8)
Alkaline Phosphatase: 69 IU/L (ref 44–121)
BUN/Creatinine Ratio: 27 (ref 12–28)
BUN: 38 mg/dL — ABNORMAL HIGH (ref 8–27)
Bilirubin Total: 0.2 mg/dL (ref 0.0–1.2)
CO2: 17 mmol/L — ABNORMAL LOW (ref 20–29)
Calcium: 10.6 mg/dL — ABNORMAL HIGH (ref 8.7–10.3)
Chloride: 107 mmol/L — ABNORMAL HIGH (ref 96–106)
Creatinine, Ser: 1.4 mg/dL — ABNORMAL HIGH (ref 0.57–1.00)
Globulin, Total: 2.3 g/dL (ref 1.5–4.5)
Glucose: 131 mg/dL — ABNORMAL HIGH (ref 70–99)
Potassium: 5.1 mmol/L (ref 3.5–5.2)
Sodium: 139 mmol/L (ref 134–144)
Total Protein: 6.6 g/dL (ref 6.0–8.5)
eGFR: 40 mL/min/{1.73_m2} — ABNORMAL LOW (ref >59)

## 2023-10-05 LAB — CBC WITH DIFFERENTIAL/PLATELET
Basophils Absolute: 0.1 10*3/uL (ref 0.0–0.2)
Basos: 1 %
EOS (ABSOLUTE): 0.1 10*3/uL (ref 0.0–0.4)
Eos: 1 %
Hematocrit: 44.1 % (ref 34.0–46.6)
Hemoglobin: 14.1 g/dL (ref 11.1–15.9)
Immature Grans (Abs): 0 10*3/uL (ref 0.0–0.1)
Immature Granulocytes: 0 %
Lymphocytes Absolute: 1.4 10*3/uL (ref 0.7–3.1)
Lymphs: 18 %
MCH: 29.7 pg (ref 26.6–33.0)
MCHC: 32 g/dL (ref 31.5–35.7)
MCV: 93 fL (ref 79–97)
Monocytes Absolute: 0.5 10*3/uL (ref 0.1–0.9)
Monocytes: 6 %
Neutrophils Absolute: 5.7 10*3/uL (ref 1.4–7.0)
Neutrophils: 74 %
Platelets: 310 10*3/uL (ref 150–450)
RBC: 4.74 x10E6/uL (ref 3.77–5.28)
RDW: 12.9 % (ref 11.7–15.4)
WBC: 7.7 10*3/uL (ref 3.4–10.8)

## 2023-10-05 LAB — TSH: TSH: 1.33 u[IU]/mL (ref 0.450–4.500)

## 2023-10-05 LAB — LIPID PANEL
Chol/HDL Ratio: 2.5 ratio (ref 0.0–4.4)
Cholesterol, Total: 128 mg/dL (ref 100–199)
HDL: 51 mg/dL (ref >39)
LDL Chol Calc (NIH): 61 mg/dL (ref 0–99)
Triglycerides: 84 mg/dL (ref 0–149)
VLDL Cholesterol Cal: 16 mg/dL (ref 5–40)

## 2023-10-05 MED ORDER — CEPHALEXIN 500 MG PO CAPS
500.0000 mg | ORAL_CAPSULE | Freq: Two times a day (BID) | ORAL | 0 refills | Status: DC
Start: 2023-10-05 — End: 2024-01-24

## 2023-10-16 ENCOUNTER — Other Ambulatory Visit: Payer: Self-pay | Admitting: Family

## 2023-10-16 DIAGNOSIS — Z1231 Encounter for screening mammogram for malignant neoplasm of breast: Secondary | ICD-10-CM

## 2023-10-17 ENCOUNTER — Encounter

## 2023-12-06 ENCOUNTER — Ambulatory Visit

## 2023-12-25 ENCOUNTER — Other Ambulatory Visit: Payer: Self-pay | Admitting: Family

## 2023-12-25 DIAGNOSIS — E1159 Type 2 diabetes mellitus with other circulatory complications: Secondary | ICD-10-CM

## 2024-01-04 ENCOUNTER — Other Ambulatory Visit: Payer: Self-pay | Admitting: Family

## 2024-01-04 DIAGNOSIS — E1169 Type 2 diabetes mellitus with other specified complication: Secondary | ICD-10-CM

## 2024-01-07 ENCOUNTER — Encounter: Payer: Self-pay | Admitting: Family

## 2024-01-07 ENCOUNTER — Ambulatory Visit: Admitting: Family

## 2024-01-07 VITALS — BP 123/72 | HR 65 | Temp 98.1°F | Ht 66.0 in | Wt 156.0 lb

## 2024-01-07 DIAGNOSIS — M8580 Other specified disorders of bone density and structure, unspecified site: Secondary | ICD-10-CM | POA: Diagnosis not present

## 2024-01-07 DIAGNOSIS — E785 Hyperlipidemia, unspecified: Secondary | ICD-10-CM

## 2024-01-07 DIAGNOSIS — Z23 Encounter for immunization: Secondary | ICD-10-CM | POA: Diagnosis not present

## 2024-01-07 DIAGNOSIS — I701 Atherosclerosis of renal artery: Secondary | ICD-10-CM | POA: Diagnosis not present

## 2024-01-07 DIAGNOSIS — F17210 Nicotine dependence, cigarettes, uncomplicated: Secondary | ICD-10-CM | POA: Diagnosis not present

## 2024-01-07 DIAGNOSIS — I69354 Hemiplegia and hemiparesis following cerebral infarction affecting left non-dominant side: Secondary | ICD-10-CM | POA: Diagnosis not present

## 2024-01-07 DIAGNOSIS — I152 Hypertension secondary to endocrine disorders: Secondary | ICD-10-CM

## 2024-01-07 DIAGNOSIS — Z122 Encounter for screening for malignant neoplasm of respiratory organs: Secondary | ICD-10-CM

## 2024-01-07 DIAGNOSIS — J4489 Other specified chronic obstructive pulmonary disease: Secondary | ICD-10-CM | POA: Diagnosis not present

## 2024-01-07 DIAGNOSIS — E1169 Type 2 diabetes mellitus with other specified complication: Secondary | ICD-10-CM

## 2024-01-07 DIAGNOSIS — E1159 Type 2 diabetes mellitus with other circulatory complications: Secondary | ICD-10-CM

## 2024-01-07 LAB — CMP14+EGFR
ALT: 12 IU/L (ref 0–32)
AST: 12 IU/L (ref 0–40)
Albumin: 4.3 g/dL (ref 3.8–4.8)
Alkaline Phosphatase: 65 IU/L (ref 51–125)
BUN/Creatinine Ratio: 24 (ref 12–28)
BUN: 34 mg/dL — ABNORMAL HIGH (ref 8–27)
Bilirubin Total: 0.3 mg/dL (ref 0.0–1.2)
CO2: 20 mmol/L (ref 20–29)
Calcium: 10.6 mg/dL — ABNORMAL HIGH (ref 8.7–10.3)
Chloride: 104 mmol/L (ref 96–106)
Creatinine, Ser: 1.43 mg/dL — ABNORMAL HIGH (ref 0.57–1.00)
Globulin, Total: 2.5 g/dL (ref 1.5–4.5)
Glucose: 123 mg/dL — ABNORMAL HIGH (ref 70–99)
Potassium: 5.7 mmol/L — ABNORMAL HIGH (ref 3.5–5.2)
Sodium: 140 mmol/L (ref 134–144)
Total Protein: 6.8 g/dL (ref 6.0–8.5)
eGFR: 39 mL/min/1.73 — ABNORMAL LOW (ref >59)

## 2024-01-07 LAB — BAYER DCA HB A1C WAIVED: HB A1C (BAYER DCA - WAIVED): 6.8 % — ABNORMAL HIGH (ref 4.8–5.6)

## 2024-01-07 MED ORDER — CLONIDINE HCL 0.3 MG PO TABS: 0.3000 mg | ORAL_TABLET | Freq: Two times a day (BID) | ORAL | 0 refills | Status: DC

## 2024-01-07 MED ORDER — LISINOPRIL 20 MG PO TABS: 20.0000 mg | ORAL_TABLET | Freq: Every day | ORAL | 3 refills | Status: DC

## 2024-01-07 NOTE — Progress Notes (Signed)
 Subjective:    Patient ID: Amanda Booker, female    DOB: 11-09-1950, 73 y.o.   MRN: [id number]  Chief Complaint  Patient presents with   3 MONTH FOLLOW UP   PT presents to the office today for chronic follow up. 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    She is followed by Cardiologists as needed for HTN, aortic valve stenosis, and hyperlipidemia.    She has COPD and denies any SOB. She continues to smoke 1/2 pack a day.    She has atherosclerosis and takes Lipitor. Doing well.    She has osteopenia and taking calcium  and vit D daily. Last Dexa scan 08/11/22.   Hypertension This is a chronic problem. The current episode started more than 1 year ago. The problem has been resolved since onset. The problem is controlled. Associated symptoms include malaise/fatigue. Pertinent negatives include no blurred vision, peripheral edema or shortness of breath. Risk factors for coronary artery disease include diabetes mellitus, dyslipidemia and sedentary lifestyle. The current treatment provides moderate improvement.  Diabetes She presents for her follow-up diabetic visit. She has type 2 diabetes mellitus. Pertinent negatives for diabetes include no blurred vision and no foot paresthesias. Symptoms are stable. Risk factors for coronary artery disease include dyslipidemia, diabetes mellitus, hypertension, sedentary lifestyle and post-menopausal. She is following a generally healthy diet. Her overall blood glucose range is 110-130 mg/dl. Eye exam is current.  Hyperlipidemia This is a chronic problem. The current episode started more than 1 year ago. The problem is controlled. Recent lipid tests were reviewed and are normal. Exacerbating diseases include obesity. Factors aggravating her hyperlipidemia include smoking. Pertinent negatives include no shortness of breath. Current antihyperlipidemic treatment includes statins. The current treatment provides moderate improvement of lipids. Risk  factors for coronary artery disease include dyslipidemia, diabetes mellitus, hypertension, a sedentary lifestyle and post-menopausal.  Nicotine  Dependence Presents for follow-up visit. The symptoms have been stable. She smokes < 1/2 a pack of cigarettes per day.      Review of Systems  Constitutional:  Positive for malaise/fatigue.  Eyes:  Negative for blurred vision.  Respiratory:  Negative for shortness of breath.   All other systems reviewed and are negative.  Family History  Problem Relation Age of Onset   Cancer Mother        uterine   Hyperlipidemia Mother    Cerebral aneurysm Sister    Hypertension Sister    Kidney failure Sister    Diabetes Brother    Hypertension Brother    Diabetes Brother    Hypertension Brother    Breast cancer Neg Hx    Social History   Socioeconomic History   Marital status: Widowed    Spouse name: Not on file   Number of children: 3   Years of education: Not on file   Highest education level: 9th grade  Occupational History   Occupation: Designer, fashion/clothing    Comment: retired  Tobacco Use   Smoking status: Every Day    Current packs/day: 0.75    Average packs/day: 0.8 packs/day for 60.0 years (45.0 ttl pk-yrs)    Types: Cigarettes   Smokeless tobacco: Never  Vaping Use   Vaping status: Never Used  Substance and Sexual Activity   Alcohol use: No   Drug use: No   Sexual activity: Not Currently  Other Topics Concern   Not on file  Social History Narrative   ** Merged History Encounter **  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 She has 3 grown children that she sees regularly. She reports that she is not physically active. She does attend church when she is able and she enjoys crossword puzzles and word searches.        Social Drivers of Corporate investment banker Strain: Low Risk  (08/02/2022)   Overall Financial Resource Strain (CARDIA)    Difficulty of Paying Living Expenses: Not hard at all  Food  Insecurity: No Food Insecurity (08/02/2022)   Hunger Vital Sign    Worried About Running Out of Food in the Last Year: Never true    Ran Out of Food in the Last Year: Never true  Transportation Needs: No Transportation Needs (08/02/2022)   PRAPARE - Administrator, Civil Service (Medical): No    Lack of Transportation (Non-Medical): No  Physical Activity: Inactive (08/02/2022)   Exercise Vital Sign    Days of Exercise per Week: 0 days    Minutes of Exercise per Session: 0 min  Stress: No Stress Concern Present (08/02/2022)   [employer] of Occupational Health - Occupational Stress Questionnaire    Feeling of Stress : Not at all  Social Connections: Socially Isolated (08/02/2022)   Social Connection and Isolation Panel    Frequency of Communication with Friends and Family: More than three times a week    Frequency of Social Gatherings with Friends and Family: More than three times a week    Attends Religious Services: Never    Database administrator or Organizations: No    Attends Banker Meetings: Never    Marital Status: Widowed       Objective:   Physical Exam Vitals reviewed.  Constitutional:      General: She is not in acute distress.    Appearance: She is well-developed.  HENT:     Head: Normocephalic and atraumatic.     Right Ear: Tympanic membrane normal.     Left Ear: Tympanic membrane normal.  Eyes:     Pupils: Pupils are equal, round, and reactive to light.  Neck:     Thyroid : No thyromegaly.  Cardiovascular:     Rate and Rhythm: Normal rate and regular rhythm.     Heart sounds: Murmur heard.  Pulmonary:     Effort: Pulmonary effort is normal. No respiratory distress.     Breath sounds: Normal breath sounds. No wheezing.     Comments: diminished Abdominal:     General: Bowel sounds are normal. There is no distension.     Palpations: Abdomen is soft.     Tenderness: There is no abdominal tenderness.  Musculoskeletal:         General: No tenderness. Normal range of motion.     Cervical back: Normal range of motion and neck supple.  Skin:    General: Skin is warm and dry.  Neurological:     Mental Status: She is alert and oriented to person, place, and time.     Cranial Nerves: No cranial nerve deficit.     Motor: Weakness present.     Deep Tendon Reflexes: Reflexes are normal and symmetric.     Comments: left sided weakness  Psychiatric:        Behavior: Behavior normal.        Thought Content: Thought content normal.        Judgment: Judgment normal.      BP 123/72   Pulse 65  Temp 98.1 F (36.7 C)   Ht 5' 6 (1.676 m)   Wt 156 lb (70.8 kg)   SpO2 95%   BMI 25.18 kg/m      Assessment & Plan:  Amanda Booker comes in today with chief complaint of 3 MONTH FOLLOW UP   Diagnosis and orders addressed:  1. Hypertension associated with diabetes (HCC) - cloNIDine  (CATAPRES ) 0.3 MG tablet; Take 1 tablet (0.3 mg total) by mouth 2 (two) times daily.  Dispense: 180 tablet; Refill: 0 - lisinopril  (ZESTRIL ) 20 MG tablet; Take 1 tablet (20 mg total) by mouth daily.  Dispense: 90 tablet; Refill: 3 - CMP14+EGFR  2. DM type 2 with diabetic dyslipidemia (HCC) (Primary) - CMP14+EGFR - Bayer DCA Hb A1c Waived - Microalbumin / creatinine urine ratio  3. Atherosclerosis of renal artery (HCC) - CMP14+EGFR  4. COPD (chronic obstructive pulmonary disease) with chronic bronchitis (HCC) - CMP14+EGFR  5. Cigarette nicotine  dependence without complication - CMP14+EGFR  6. Hemiparesis affecting left side as late effect of cerebrovascular accident (CVA) (HCC) - CMP14+EGFR  7. Dyslipidemia - CMP14+EGFR  8. Osteopenia, unspecified location  - CMP14+EGFR  9. Encounter for immunization - Flu vaccine HIGH DOSE PF(Fluzone Trivalent)  10. Screening for lung cancer - CMP14+EGFR - Ambulatory Referral Lung Cancer Screening [facility]    Labs pending Lung cancer screening pending Continue  current medications  Health Maintenance reviewed Diet and exercise encouraged  Follow up plan: 4 months    Bari Learn, FNP

## 2024-01-07 NOTE — Patient Instructions (Addendum)
 Health Maintenance After Age 71 After age 27, you are at a higher risk for certain long-term diseases and infections as well as injuries from falls. Falls are a major cause of broken bones and head injuries in people who are older than age 73. Getting regular preventive care can help to keep you healthy and well. Preventive care includes getting regular testing and making lifestyle changes as recommended by your health care provider. Talk with your health care provider about: Which screenings and tests you should have. A screening is a test that checks for a disease when you have no symptoms. A diet and exercise plan that is right for you. What should I know about screenings and tests to prevent falls? Screening and testing are the best ways to find a health problem early. Early diagnosis and treatment give you the best chance of managing medical conditions that are common after age 90. Certain conditions and lifestyle choices may make you more likely to have a fall. Your health care provider may recommend: Regular vision checks. Poor vision and conditions such as cataracts can make you more likely to have a fall. If you wear glasses, make sure to get your prescription updated if your vision changes. Medicine review. Work with your health care provider to regularly review all of the medicines you are taking, including over-the-counter medicines. Ask your health care provider about any side effects that may make you more likely to have a fall. Tell your health care provider if any medicines that you take make you feel dizzy or sleepy. Strength and balance checks. Your health care provider may recommend certain tests to check your strength and balance while standing, walking, or changing positions. Foot health exam. Foot pain and numbness, as well as not wearing proper footwear, can make you more likely to have a fall. Screenings, including: Osteoporosis screening. Osteoporosis is a condition that causes  the bones to get weaker and break more easily. Blood pressure screening. Blood pressure changes and medicines to control blood pressure can make you feel dizzy. Depression screening. You may be more likely to have a fall if you have a fear of falling, feel depressed, or feel unable to do activities that you used to do. Alcohol  use screening. Using too much alcohol  can affect your balance and may make you more likely to have a fall. Follow these instructions at home: Lifestyle Do not drink alcohol  if: Your health care provider tells you not to drink. If you drink alcohol : Limit how much you have to: 0-1 drink a day for women. 0-2 drinks a day for men. Know how much alcohol  is in your drink. In the [place] one drink equals one 12 oz bottle of beer (355 mL), one 5 oz glass of wine (148 mL), or one 1 oz glass of hard liquor (44 mL). Do not use any products that contain nicotine or tobacco. These products include cigarettes, chewing tobacco, and vaping devices, such as e-cigarettes. If you need help quitting, ask your health care provider. Activity  Follow a regular exercise program to stay fit. This will help you maintain your balance. Ask your health care provider what types of exercise are appropriate for you. If you need a cane or walker, use it as recommended by your health care provider. Wear supportive shoes that have nonskid soles. Safety  Remove any tripping hazards, such as rugs, cords, and clutter. Install safety equipment such as grab bars in bathrooms and safety rails on stairs. Keep rooms and walkways  well-lit. General instructions Talk with your health care provider about your risks for falling. Tell your health care provider if: You fall. Be sure to tell your health care provider about all falls, even ones that seem minor. You feel dizzy, tiredness (fatigue), or off-balance. Take over-the-counter and prescription medicines only as told by your health care provider. These include  supplements. Eat a healthy diet and maintain a healthy weight. A healthy diet includes low-fat dairy products, low-fat (lean) meats, and fiber from whole grains, beans, and lots of fruits and vegetables. Stay current with your vaccines. Schedule regular health, dental, and eye exams. Summary Having a healthy lifestyle and getting preventive care can help to protect your health and wellness after age 15. Screening and testing are the best way to find a health problem early and help you avoid having a fall. Early diagnosis and treatment give you the best chance for managing medical conditions that are more common for people who are older than age 42. Falls are a major cause of broken bones and head injuries in people who are older than age 64. Take precautions to prevent a fall at home. Work with your health care provider to learn what changes you can make to improve your health and wellness and to prevent falls. This information is not intended to replace advice given to you by your health care provider. Make sure you discuss any questions you have with your health care provider. Document Revised: 08/30/2020 Document Reviewed: 08/30/2020 Elsevier Patient Education  2024 [employer].

## 2024-01-08 ENCOUNTER — Other Ambulatory Visit: Payer: Self-pay | Admitting: Family

## 2024-01-08 ENCOUNTER — Ambulatory Visit: Payer: Self-pay | Admitting: Family

## 2024-01-08 DIAGNOSIS — E1159 Type 2 diabetes mellitus with other circulatory complications: Secondary | ICD-10-CM

## 2024-01-08 DIAGNOSIS — E875 Hyperkalemia: Secondary | ICD-10-CM

## 2024-01-08 LAB — MICROALBUMIN / CREATININE URINE RATIO
Creatinine, Urine: 119.1 mg/dL
Microalb/Creat Ratio: 112 mg/g{creat} — ABNORMAL HIGH (ref 0–29)
Microalbumin, Urine: 133.9 ug/mL

## 2024-01-08 MED ORDER — SPIRONOLACTONE 25 MG PO TABS: 12.5000 mg | ORAL_TABLET | Freq: Every day | ORAL | 1 refills | Status: DC

## 2024-01-08 MED ORDER — HYDROCHLOROTHIAZIDE 25 MG PO TABS: 25.0000 mg | ORAL_TABLET | Freq: Every day | ORAL | 1 refills | Status: AC

## 2024-01-10 ENCOUNTER — Other Ambulatory Visit

## 2024-01-10 DIAGNOSIS — E875 Hyperkalemia: Secondary | ICD-10-CM

## 2024-01-11 ENCOUNTER — Ambulatory Visit: Payer: Self-pay | Admitting: Family

## 2024-01-11 LAB — BMP8+EGFR
BUN/Creatinine Ratio: 31 — ABNORMAL HIGH (ref 12–28)
BUN: 41 mg/dL — ABNORMAL HIGH (ref 8–27)
CO2: 19 mmol/L — ABNORMAL LOW (ref 20–29)
Calcium: 10.5 mg/dL — ABNORMAL HIGH (ref 8.7–10.3)
Chloride: 103 mmol/L (ref 96–106)
Creatinine, Ser: 1.34 mg/dL — ABNORMAL HIGH (ref 0.57–1.00)
Glucose: 116 mg/dL — ABNORMAL HIGH (ref 70–99)
Potassium: 5.3 mmol/L — ABNORMAL HIGH (ref 3.5–5.2)
Sodium: 138 mmol/L (ref 134–144)
eGFR: 42 mL/min/1.73 — ABNORMAL LOW (ref >59)

## 2024-01-16 ENCOUNTER — Inpatient Hospital Stay: Admission: RE | Admit: 2024-01-16 | Source: Ambulatory Visit

## 2024-01-21 ENCOUNTER — Telehealth: Payer: Self-pay | Admitting: Family

## 2024-01-21 DIAGNOSIS — E875 Hyperkalemia: Secondary | ICD-10-CM

## 2024-01-21 NOTE — Telephone Encounter (Signed)
 Lab ordered.

## 2024-01-21 NOTE — Telephone Encounter (Signed)
 Patient has lab appt 10-2 and orders needs to be put in.

## 2024-01-24 ENCOUNTER — Ambulatory Visit: Admitting: Family

## 2024-01-24 ENCOUNTER — Other Ambulatory Visit

## 2024-01-24 ENCOUNTER — Encounter: Payer: Self-pay | Admitting: Family

## 2024-01-24 VITALS — BP 106/66 | HR 62 | Temp 97.3°F | Wt 154.6 lb

## 2024-01-24 DIAGNOSIS — I152 Hypertension secondary to endocrine disorders: Secondary | ICD-10-CM

## 2024-01-24 DIAGNOSIS — E875 Hyperkalemia: Secondary | ICD-10-CM

## 2024-01-24 DIAGNOSIS — E1159 Type 2 diabetes mellitus with other circulatory complications: Secondary | ICD-10-CM

## 2024-01-24 LAB — CMP14+EGFR
ALT: 11 IU/L (ref 0–32)
AST: 12 IU/L (ref 0–40)
Albumin: 4.2 g/dL (ref 3.8–4.8)
Alkaline Phosphatase: 64 IU/L (ref 49–135)
BUN/Creatinine Ratio: 28 (ref 12–28)
BUN: 38 mg/dL — ABNORMAL HIGH (ref 8–27)
Bilirubin Total: 0.3 mg/dL (ref 0.0–1.2)
CO2: 15 mmol/L — ABNORMAL LOW (ref 20–29)
Calcium: 9.9 mg/dL (ref 8.7–10.3)
Chloride: 108 mmol/L — ABNORMAL HIGH (ref 96–106)
Creatinine, Ser: 1.36 mg/dL — ABNORMAL HIGH (ref 0.57–1.00)
Globulin, Total: 2.2 g/dL (ref 1.5–4.5)
Glucose: 105 mg/dL — ABNORMAL HIGH (ref 70–99)
Potassium: 4.9 mmol/L (ref 3.5–5.2)
Sodium: 139 mmol/L (ref 134–144)
Total Protein: 6.4 g/dL (ref 6.0–8.5)
eGFR: 42 mL/min/1.73 — ABNORMAL LOW (ref >59)

## 2024-01-24 NOTE — Patient Instructions (Signed)
Hyperkalemia Hyperkalemia occurs when the level of potassium in the blood is too high. Potassium is an important mineral (electrolyte) that helps the muscles and nerves function normally. It affects how the heart works, and it helps keep fluids and minerals balanced in the body. If there is too much potassium in your blood, it can affect your heart's ability to function normally. Potassium is normally removed (excreted) from the body by the kidneys. Hyperkalemia can result from various conditions. It can range from mild to severe. What are the causes? This condition may be caused by: Taking in too much potassium. This can happen if: You use salt substitutes. They contain large amounts of potassium. You take potassium supplements. You eat too many foods that are high in potassium if you have kidney disease. Excreting too little potassium. This can happen if: Your kidneys are not working properly. Kidney (renal) disease, including short-term or long-term renal failure, is a common cause of hyperkalemia. You are taking medicines that lower your excretion of potassium, such as ACE inhibitors, angiotensin II receptor blockers (ARBs), or potassium-sparing diuretics, such as spironolactone. You have Addison's disease. You have a urinary tract blockage, such as kidney stones. You are on treatment to mechanically clean your blood (dialysis) and you skip a treatment. Your cells releasing a high amount of potassium into the blood. This can happen with: Injury to muscles (rhabdomyolysis) or other tissues. Most potassium is stored in your muscles. Severe burns, injuries, or infections. Acidic blood plasma (acidosis). Acidosis can result from many diseases, such as uncontrolled diabetes. What increases the risk? You are more likely to develop this condition if you have alcoholism or if you use drugs heavily. What are the signs or symptoms? In many cases, there are no symptoms. However, when your potassium  level becomes high enough, you may have symptoms such as: An irregular or very slow heartbeat. Nausea. Tiredness (fatigue). Confusion. Tingling of your skin or numbness of your hands or feet. Muscle cramps. Muscle weakness. Not being able to move (paralysis). How is this diagnosed? This condition may be diagnosed based on: Your symptoms and medical history. Your health care provider will ask about your use of over-the-counter and prescription medicines. A physical exam. Blood tests. An electrocardiogram (ECG). How is this treated? Treatment depends on the cause and severity of your condition. Treatment may need to be done in the hospital setting. Treatment may include: Receiving a sugar solution (glucose) through an IV along with insulin to shift potassium out of your blood and into your cells. Taking a medicine called albuterol to shift potassium out of your blood and into your cells. Taking medicines to remove the potassium from your body. Having dialysis to remove the potassium from your body. Taking calcium to protect your heart from the effects of high potassium, such as irregular rhythms (arrhythmias). Follow these instructions at home:  Take over-the-counter and prescription medicines only as told by your health care provider. Do not take any supplements, natural food products, herbs, or vitamins without reviewing them with your health care provider. Certain supplements and natural food products contain high amounts of potassium. If you drink alcohol, limit how much you have as told by your health care provider. Do not use illegal drugs. If you need help quitting, ask your health care provider. If you have kidney disease, you may need to follow a low-potassium diet. A dietitian can help you learn which foods have high or low amounts of potassium. Keep all follow-up visits. This is important.  Contact a health care provider if: You have an irregular or very slow heartbeat. You  feel light-headed. You feel weak. You are nauseous. You have tingling or numbness in your hands or feet. Get help right away if: You have shortness of breath. You have chest pain or discomfort. You faint. You have muscle paralysis. These symptoms may be an emergency. Get help right away. Call 911. Do not wait to see if the symptoms will go away. Do not drive yourself to the hospital. Summary Hyperkalemia occurs when the level of potassium in your blood is too high. This condition may be caused by taking in too much potassium, excreting too little potassium, or releasing a high amount of potassium from your cells into your blood. Hyperkalemia can result from many underlying conditions, especially chronic kidney disease, or from taking certain medicines. Treatment of hyperkalemia may include medicine to shift potassium out of your blood and into your cells or to remove the potassium from your body. If you have kidney disease, you may need to follow a low-potassium diet. A dietitian can help you learn which foods have high or low amounts of potassium. This information is not intended to replace advice given to you by your health care provider. Make sure you discuss any questions you have with your health care provider. Document Revised: 12/23/2020 Document Reviewed: 12/23/2020 Elsevier Patient Education  2024 [employer].

## 2024-01-24 NOTE — Progress Notes (Signed)
 Subjective:    Patient ID: Amanda Booker, female    DOB: [birth year], 73 y.o.   MRN: [id number]  Chief Complaint  Patient presents with   Follow-up   PT presents to the office today to follow up on hyperkalemia. We decreased her spironolactone  to 12.5 mg from 25 mg and added hydrochlorothiazide  25 mg.  Hypertension This is a chronic problem. The current episode started more than 1 year ago. The problem has been resolved since onset. The problem is controlled. Pertinent negatives include no malaise/fatigue, peripheral edema or shortness of breath. The current treatment provides moderate improvement. Hypertensive end-organ damage includes kidney disease and CVA.      Review of Systems  Constitutional:  Negative for malaise/fatigue.  Respiratory:  Negative for shortness of breath.   All other systems reviewed and are negative.   Social History   Socioeconomic History   Marital status: Widowed    Spouse name: Not on file   Number of children: 3   Years of education: Not on file   Highest education level: 9th grade  Occupational History   Occupation: Designer, fashion/clothing    Comment: retired  Tobacco Use   Smoking status: Every Day    Current packs/day: 0.75    Average packs/day: 0.8 packs/day for 60.0 years (45.0 ttl pk-yrs)    Types: Cigarettes   Smokeless tobacco: Never  Vaping Use   Vaping status: Never Used  Substance and Sexual Activity   Alcohol use: No   Drug use: No   Sexual activity: Not Currently  Other Topics Concern   Not on file  Social History Narrative   ** Merged History Encounter **       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 She has 3 grown children that she sees regularly. She reports that she is not physically active. She does attend church when she is able and she enjoys crossword puzzles and word searches.        Social Drivers of Corporate investment banker Strain: Low Risk  (08/02/2022)   Overall Financial Resource Strain  (CARDIA)    Difficulty of Paying Living Expenses: Not hard at all  Food Insecurity: No Food Insecurity (08/02/2022)   Hunger Vital Sign    Worried About Running Out of Food in the Last Year: Never true    Ran Out of Food in the Last Year: Never true  Transportation Needs: No Transportation Needs (08/02/2022)   PRAPARE - Administrator, Civil Service (Medical): No    Lack of Transportation (Non-Medical): No  Physical Activity: Inactive (08/02/2022)   Exercise Vital Sign    Days of Exercise per Week: 0 days    Minutes of Exercise per Session: 0 min  Stress: No Stress Concern Present (08/02/2022)   Amanda Booker of Occupational Health - Occupational Stress Questionnaire    Feeling of Stress : Not at all  Social Connections: Socially Isolated (08/02/2022)   Social Connection and Isolation Panel    Frequency of Communication with Friends and Family: More than three times a week    Frequency of Social Gatherings with Friends and Family: More than three times a week    Attends Religious Services: Never    Database administrator or Organizations: No    Attends Banker Meetings: Never    Marital Status: Widowed   Family History  Problem Relation Age of Onset   Cancer Mother  uterine   Hyperlipidemia Mother    Cerebral aneurysm Sister    Hypertension Sister    Kidney failure Sister    Diabetes Brother    Hypertension Brother    Diabetes Brother    Hypertension Brother    Breast cancer Neg Hx         Objective:   Physical Exam Vitals reviewed.  Constitutional:      General: She is not in acute distress.    Appearance: She is well-developed.  HENT:     Head: Normocephalic and atraumatic.  Eyes:     Pupils: Pupils are equal, round, and reactive to light.  Neck:     Thyroid : No thyromegaly.  Cardiovascular:     Rate and Rhythm: Normal rate and regular rhythm.     Heart sounds: Normal heart sounds. No murmur heard. Pulmonary:     Effort:  Pulmonary effort is normal. No respiratory distress.     Breath sounds: Normal breath sounds. No wheezing.  Abdominal:     General: Bowel sounds are normal. There is no distension.     Palpations: Abdomen is soft.     Tenderness: There is no abdominal tenderness.  Musculoskeletal:        General: No tenderness. Normal range of motion.     Cervical back: Normal range of motion and neck supple.  Skin:    General: Skin is warm and dry.  Neurological:     Mental Status: She is alert and oriented to person, place, and time.     Cranial Nerves: No cranial nerve deficit.     Motor: Weakness (mild left sided weakness) present.     Gait: Gait abnormal.     Deep Tendon Reflexes: Reflexes are normal and symmetric.  Psychiatric:        Behavior: Behavior normal.        Thought Content: Thought content normal.        Judgment: Judgment normal.     BP 106/66   Pulse 62   Temp (!) 97.3 F (36.3 C) (Temporal)   Wt 154 lb 9.6 oz (70.1 kg)   BMI 24.95 kg/m      Assessment & Plan:  Amanda Booker comes in today with chief complaint of Follow-up   Diagnosis and orders addressed:  1. Hypertension associated with diabetes (HCC) (Primary) At goal today Discussed stopping spironolactone   Force fluids -Dash diet information given -Exercise encouraged - Stress Management  -Follow up 3 months   2. Hyperkalemia BMP pending    Labs pending Continue current medications  Keep follow up with specialists  Health Maintenance reviewed Diet and exercise encouraged   Bari Learn, FNP

## 2024-01-28 ENCOUNTER — Ambulatory Visit: Payer: Self-pay | Admitting: Family

## 2024-02-13 ENCOUNTER — Ambulatory Visit

## 2024-04-03 ENCOUNTER — Ambulatory Visit

## 2024-04-22 ENCOUNTER — Other Ambulatory Visit: Payer: Self-pay | Admitting: Family

## 2024-04-22 DIAGNOSIS — I152 Hypertension secondary to endocrine disorders: Secondary | ICD-10-CM

## 2024-04-22 DIAGNOSIS — E1169 Type 2 diabetes mellitus with other specified complication: Secondary | ICD-10-CM

## 2024-05-08 ENCOUNTER — Encounter: Payer: Self-pay | Admitting: Family

## 2024-05-08 ENCOUNTER — Ambulatory Visit: Payer: Self-pay | Admitting: Family

## 2024-05-08 VITALS — BP 100/68 | HR 65 | Temp 97.2°F | Ht 66.0 in | Wt 152.0 lb

## 2024-05-08 DIAGNOSIS — F17210 Nicotine dependence, cigarettes, uncomplicated: Secondary | ICD-10-CM

## 2024-05-08 DIAGNOSIS — M8580 Other specified disorders of bone density and structure, unspecified site: Secondary | ICD-10-CM

## 2024-05-08 DIAGNOSIS — E785 Hyperlipidemia, unspecified: Secondary | ICD-10-CM

## 2024-05-08 DIAGNOSIS — E1169 Type 2 diabetes mellitus with other specified complication: Secondary | ICD-10-CM | POA: Diagnosis not present

## 2024-05-08 DIAGNOSIS — E1159 Type 2 diabetes mellitus with other circulatory complications: Secondary | ICD-10-CM | POA: Diagnosis not present

## 2024-05-08 DIAGNOSIS — I350 Nonrheumatic aortic (valve) stenosis: Secondary | ICD-10-CM | POA: Diagnosis not present

## 2024-05-08 DIAGNOSIS — Z7984 Long term (current) use of oral hypoglycemic drugs: Secondary | ICD-10-CM | POA: Diagnosis not present

## 2024-05-08 DIAGNOSIS — I152 Hypertension secondary to endocrine disorders: Secondary | ICD-10-CM

## 2024-05-08 DIAGNOSIS — I69354 Hemiplegia and hemiparesis following cerebral infarction affecting left non-dominant side: Secondary | ICD-10-CM | POA: Diagnosis not present

## 2024-05-08 DIAGNOSIS — I701 Atherosclerosis of renal artery: Secondary | ICD-10-CM | POA: Diagnosis not present

## 2024-05-08 DIAGNOSIS — J4489 Other specified chronic obstructive pulmonary disease: Secondary | ICD-10-CM

## 2024-05-08 LAB — BAYER DCA HB A1C WAIVED: HB A1C (BAYER DCA - WAIVED): 7 % — ABNORMAL HIGH (ref 4.8–5.6)

## 2024-05-08 MED ORDER — SPIRONOLACTONE 25 MG PO TABS: 12.5000 mg | ORAL_TABLET | Freq: Every day | ORAL | 1 refills | Status: AC

## 2024-05-08 MED ORDER — ALBUTEROL SULFATE HFA 108 (90 BASE) MCG/ACT IN AERS: 2.0000 | INHALATION_SPRAY | Freq: Four times a day (QID) | RESPIRATORY_TRACT | 11 refills | Status: AC | PRN

## 2024-05-08 MED ORDER — ATORVASTATIN CALCIUM 40 MG PO TABS: 40.0000 mg | ORAL_TABLET | Freq: Every morning | ORAL | 0 refills | Status: AC

## 2024-05-08 MED ORDER — AMLODIPINE BESYLATE 10 MG PO TABS: 10.0000 mg | ORAL_TABLET | Freq: Every morning | ORAL | 0 refills | Status: AC

## 2024-05-08 MED ORDER — CLONIDINE HCL 0.3 MG PO TABS: 0.3000 mg | ORAL_TABLET | Freq: Two times a day (BID) | ORAL | 0 refills | Status: AC

## 2024-05-08 MED ORDER — METFORMIN HCL 1000 MG PO TABS: 1000.0000 mg | ORAL_TABLET | Freq: Two times a day (BID) | ORAL | 3 refills | Status: AC

## 2024-05-08 MED ORDER — METOPROLOL SUCCINATE ER 50 MG PO TB24: ORAL_TABLET | ORAL | 0 refills | Status: AC

## 2024-05-08 MED ORDER — GLIPIZIDE 5 MG PO TABS: 5.0000 mg | ORAL_TABLET | Freq: Two times a day (BID) | ORAL | 3 refills | Status: AC

## 2024-05-08 MED ORDER — DAPAGLIFLOZIN PROPANEDIOL 5 MG PO TABS: 5.0000 mg | ORAL_TABLET | Freq: Every day | ORAL | 0 refills | Status: AC

## 2024-05-08 MED ORDER — TRELEGY ELLIPTA 100-62.5-25 MCG/ACT IN AEPB: 1.0000 | INHALATION_SPRAY | Freq: Every day | RESPIRATORY_TRACT | 11 refills | Status: AC

## 2024-05-08 NOTE — Patient Instructions (Signed)
 Health Maintenance After Age 72 After age 27, you are at a higher risk for certain long-term diseases and infections as well as injuries from falls. Falls are a major cause of broken bones and head injuries in people who are older than age 73. Getting regular preventive care can help to keep you healthy and well. Preventive care includes getting regular testing and making lifestyle changes as recommended by your health care provider. Talk with your health care provider about: Which screenings and tests you should have. A screening is a test that checks for a disease when you have no symptoms. A diet and exercise plan that is right for you. What should I know about screenings and tests to prevent falls? Screening and testing are the best ways to find a health problem early. Early diagnosis and treatment give you the best chance of managing medical conditions that are common after age 90. Certain conditions and lifestyle choices may make you more likely to have a fall. Your health care provider may recommend: Regular vision checks. Poor vision and conditions such as cataracts can make you more likely to have a fall. If you wear glasses, make sure to get your prescription updated if your vision changes. Medicine review. Work with your health care provider to regularly review all of the medicines you are taking, including over-the-counter medicines. Ask your health care provider about any side effects that may make you more likely to have a fall. Tell your health care provider if any medicines that you take make you feel dizzy or sleepy. Strength and balance checks. Your health care provider may recommend certain tests to check your strength and balance while standing, walking, or changing positions. Foot health exam. Foot pain and numbness, as well as not wearing proper footwear, can make you more likely to have a fall. Screenings, including: Osteoporosis screening. Osteoporosis is a condition that causes  the bones to get weaker and break more easily. Blood pressure screening. Blood pressure changes and medicines to control blood pressure can make you feel dizzy. Depression screening. You may be more likely to have a fall if you have a fear of falling, feel depressed, or feel unable to do activities that you used to do. Alcohol  use screening. Using too much alcohol  can affect your balance and may make you more likely to have a fall. Follow these instructions at home: Lifestyle Do not drink alcohol  if: Your health care provider tells you not to drink. If you drink alcohol : Limit how much you have to: 0-1 drink a day for women. 0-2 drinks a day for men. Know how much alcohol  is in your drink. In the [place] one drink equals one 12 oz bottle of beer (355 mL), one 5 oz glass of wine (148 mL), or one 1 oz glass of hard liquor (44 mL). Do not use any products that contain nicotine or tobacco. These products include cigarettes, chewing tobacco, and vaping devices, such as e-cigarettes. If you need help quitting, ask your health care provider. Activity  Follow a regular exercise program to stay fit. This will help you maintain your balance. Ask your health care provider what types of exercise are appropriate for you. If you need a cane or walker, use it as recommended by your health care provider. Wear supportive shoes that have nonskid soles. Safety  Remove any tripping hazards, such as rugs, cords, and clutter. Install safety equipment such as grab bars in bathrooms and safety rails on stairs. Keep rooms and walkways  well-lit. General instructions Talk with your health care provider about your risks for falling. Tell your health care provider if: You fall. Be sure to tell your health care provider about all falls, even ones that seem minor. You feel dizzy, tiredness (fatigue), or off-balance. Take over-the-counter and prescription medicines only as told by your health care provider. These include  supplements. Eat a healthy diet and maintain a healthy weight. A healthy diet includes low-fat dairy products, low-fat (lean) meats, and fiber from whole grains, beans, and lots of fruits and vegetables. Stay current with your vaccines. Schedule regular health, dental, and eye exams. Summary Having a healthy lifestyle and getting preventive care can help to protect your health and wellness after age 15. Screening and testing are the best way to find a health problem early and help you avoid having a fall. Early diagnosis and treatment give you the best chance for managing medical conditions that are more common for people who are older than age 42. Falls are a major cause of broken bones and head injuries in people who are older than age 64. Take precautions to prevent a fall at home. Work with your health care provider to learn what changes you can make to improve your health and wellness and to prevent falls. This information is not intended to replace advice given to you by your health care provider. Make sure you discuss any questions you have with your health care provider. Document Revised: 08/30/2020 Document Reviewed: 08/30/2020 Elsevier Patient Education  2024 [employer].

## 2024-05-08 NOTE — Progress Notes (Signed)
 "  Subjective:    Patient ID: Amanda Booker, female    DOB: 07/27/1950, 74 y.o.   MRN: [id number]  Chief Complaint  Patient presents with   Medical Management of Chronic Issues    Patient states she has runny nose started this week.   PT presents to the office today for chronic follow up.   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    She is followed by Cardiologists as needed for HTN, aortic valve stenosis, and hyperlipidemia.    She has COPD and denies any SOB. She continues to smoke 1/2 pack a day.    She has atherosclerosis and takes Lipitor. Doing well.    She has osteopenia and taking calcium  and vit D daily. Last Dexa scan 08/11/22.   Hypertension This is a chronic problem. The current episode started more than 1 year ago. The problem has been resolved since onset. The problem is controlled. Pertinent negatives include no blurred vision, malaise/fatigue, peripheral edema or shortness of breath. Risk factors for coronary artery disease include diabetes mellitus, dyslipidemia, sedentary lifestyle and post-menopausal state. The current treatment provides moderate improvement. Hypertensive end-organ damage includes CVA.  Diabetes She presents for her follow-up diabetic visit. She has type 2 diabetes mellitus. Pertinent negatives for diabetes include no blurred vision and no foot paresthesias. Symptoms are stable. Diabetic complications include a CVA. Risk factors for coronary artery disease include dyslipidemia, diabetes mellitus, hypertension, sedentary lifestyle and post-menopausal. She is following a generally healthy diet. Her overall blood glucose range is 110-130 mg/dl. Eye exam is current.  Hyperlipidemia This is a chronic problem. The current episode started more than 1 year ago. The problem is controlled. Recent lipid tests were reviewed and are normal. Exacerbating diseases include obesity. Factors aggravating her hyperlipidemia include smoking. Pertinent negatives  include no shortness of breath. Current antihyperlipidemic treatment includes statins. The current treatment provides moderate improvement of lipids. Risk factors for coronary artery disease include dyslipidemia, diabetes mellitus, hypertension, a sedentary lifestyle and post-menopausal.  Nicotine  Dependence Presents for follow-up visit. The symptoms have been stable. She smokes < 1/2 a pack of cigarettes per day.      Review of Systems  Constitutional:  Negative for malaise/fatigue.  Eyes:  Negative for blurred vision.  Respiratory:  Negative for shortness of breath.   All other systems reviewed and are negative.  Family History  Problem Relation Age of Onset   Cancer Mother        uterine   Hyperlipidemia Mother    Cerebral aneurysm Sister    Hypertension Sister    Kidney failure Sister    Diabetes Brother    Hypertension Brother    Diabetes Brother    Hypertension Brother    Breast cancer Neg Hx    Social History   Socioeconomic History   Marital status: Widowed    Spouse name: Not on file   Number of children: 3   Years of education: Not on file   Highest education level: 9th grade  Occupational History   Occupation: designer, fashion/clothing    Comment: retired  Tobacco Use   Smoking status: Every Day    Current packs/day: 0.75    Average packs/day: 0.8 packs/day for 60.0 years (45.0 ttl pk-yrs)    Types: Cigarettes   Smokeless tobacco: Never  Vaping Use   Vaping status: Never Used  Substance and Sexual Activity   Alcohol use: No   Drug use: No   Sexual activity: Not Currently  Other Topics Concern   Not on file  Social History Narrative   ** Merged History Encounter **       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 She has 3 grown children that she sees regularly. She reports that she is not physically active. She does attend church when she is able and she enjoys crossword puzzles and word searches.        Social Drivers of Health   Tobacco  Use: High Risk (05/08/2024)   Patient History    Smoking Tobacco Use: Every Day    Smokeless Tobacco Use: Never    Passive Exposure: Not on file  Financial Resource Strain: Low Risk (08/02/2022)   Overall Financial Resource Strain (CARDIA)    Difficulty of Paying Living Expenses: Not hard at all  Food Insecurity: No Food Insecurity (08/02/2022)   Hunger Vital Sign    Worried About Running Out of Food in the Last Year: Never true    Ran Out of Food in the Last Year: Never true  Transportation Needs: No Transportation Needs (08/02/2022)   PRAPARE - Administrator, Civil Service (Medical): No    Lack of Transportation (Non-Medical): No  Physical Activity: Inactive (08/02/2022)   Exercise Vital Sign    Days of Exercise per Week: 0 days    Minutes of Exercise per Session: 0 min  Stress: No Stress Concern Present (08/02/2022)   [employer] of Occupational Health - Occupational Stress Questionnaire    Feeling of Stress : Not at all  Social Connections: Socially Isolated (08/02/2022)   Social Connection and Isolation Panel    Frequency of Communication with Friends and Family: More than three times a week    Frequency of Social Gatherings with Friends and Family: More than three times a week    Attends Religious Services: Never    Database Administrator or Organizations: No    Attends Banker Meetings: Never    Marital Status: Widowed  Depression (PHQ2-9): Low Risk (05/08/2024)   Depression (PHQ2-9)    PHQ-2 Score: 0  Alcohol Screen: Low Risk (08/02/2022)   Alcohol Screen    Last Alcohol Screening Score (AUDIT): 0  Housing: Low Risk (08/02/2022)   Housing    Last Housing Risk Score: 0  Utilities: Not At Risk (08/02/2022)   AHC Utilities    Threatened with loss of utilities: No  Health Literacy: Not on file       Objective:   Physical Exam Vitals reviewed.  Constitutional:      General: She is not in acute distress.    Appearance: She is well-developed.   HENT:     Head: Normocephalic and atraumatic.     Right Ear: Tympanic membrane normal.     Left Ear: Tympanic membrane normal.  Eyes:     Pupils: Pupils are equal, round, and reactive to light.  Neck:     Thyroid : No thyromegaly.  Cardiovascular:     Rate and Rhythm: Normal rate and regular rhythm.     Heart sounds: Murmur heard.  Pulmonary:     Effort: Pulmonary effort is normal. No respiratory distress.     Breath sounds: Normal breath sounds. No wheezing.     Comments: diminished Abdominal:     General: Bowel sounds are normal. There is no distension.     Palpations: Abdomen is soft.     Tenderness: There is no abdominal tenderness.  Musculoskeletal:  General: No tenderness. Normal range of motion.     Cervical back: Normal range of motion and neck supple.  Skin:    General: Skin is warm and dry.  Neurological:     Mental Status: She is alert and oriented to person, place, and time.     Cranial Nerves: No cranial nerve deficit.     Motor: Weakness present.     Deep Tendon Reflexes: Reflexes are normal and symmetric.     Comments: left sided weakness  Psychiatric:        Behavior: Behavior normal.        Thought Content: Thought content normal.        Judgment: Judgment normal.      BP 100/68   Pulse 65   Temp (!) 97.2 F (36.2 C) (Temporal)   Ht 5' 6 (1.676 m)   Wt 152 lb (68.9 kg)   SpO2 97%   BMI 24.53 kg/m      Assessment & Plan:  Sherril Shipman comes in today with chief complaint of Medical Management of Chronic Issues (Patient states she has runny nose started this week.)   Diagnosis and orders addressed:  1. Hypertension associated with diabetes (HCC) - amLODipine  (NORVASC ) 10 MG tablet; Take 1 tablet (10 mg total) by mouth every morning.  Dispense: 90 tablet; Refill: 0 - cloNIDine  (CATAPRES ) 0.3 MG tablet; Take 1 tablet (0.3 mg total) by mouth 2 (two) times daily.  Dispense: 180 tablet; Refill: 0 - metoprolol  succinate (TOPROL -XL) 50 MG  24 hr tablet; TAKE 1 TABLET BY MOUTH TWICE DAILY WITH OR IMMEDIATELY AFTER A MEAL  Dispense: 180 tablet; Refill: 0 - spironolactone  (ALDACTONE ) 25 MG tablet; Take 0.5 tablets (12.5 mg total) by mouth daily.  Dispense: 90 tablet; Refill: 1 - CBC with Differential/Platelet - CMP14+EGFR  2. Hyperlipidemia associated with type 2 diabetes mellitus (HCC) - atorvastatin  (LIPITOR) 40 MG tablet; Take 1 tablet (40 mg total) by mouth every morning.  Dispense: 90 tablet; Refill: 0 - CBC with Differential/Platelet - CMP14+EGFR  3. DM type 2 with diabetic dyslipidemia (HCC) (Primary) - dapagliflozin  propanediol (FARXIGA ) 5 MG TABS tablet; Take 1 tablet (5 mg total) by mouth daily before breakfast.  Dispense: 90 tablet; Refill: 0 - glipiZIDE  (GLUCOTROL ) 5 MG tablet; Take 1 tablet (5 mg total) by mouth 2 (two) times daily before a meal.  Dispense: 180 tablet; Refill: 3 - metFORMIN  (GLUCOPHAGE ) 1000 MG tablet; Take 1 tablet (1,000 mg total) by mouth 2 (two) times daily with a meal.  Dispense: 180 tablet; Refill: 3 - CBC with Differential/Platelet - CMP14+EGFR - Bayer DCA Hb A1c Waived  4. COPD (chronic obstructive pulmonary disease) with chronic bronchitis (HCC) - Fluticasone -Umeclidin-Vilant (TRELEGY ELLIPTA ) 100-62.5-25 MCG/ACT AEPB; Inhale 1 puff into the lungs daily.  Dispense: 1 each; Refill: 11 - albuterol  (VENTOLIN  HFA) 108 (90 Base) MCG/ACT inhaler; Inhale 2 puffs into the lungs every 6 (six) hours as needed for wheezing or shortness of breath.  Dispense: 18 g; Refill: 11 - CBC with Differential/Platelet - CMP14+EGFR  5. Hemiparesis affecting left side as late effect of cerebrovascular accident (CVA) (HCC) - CBC with Differential/Platelet - CMP14+EGFR  6. Atherosclerosis of renal artery - CBC with Differential/Platelet - CMP14+EGFR  7. Cigarette nicotine  dependence without complication  - CBC with Differential/Platelet - CMP14+EGFR  8. Dyslipidemia - CBC with Differential/Platelet -  CMP14+EGFR  9. Nonrheumatic aortic valve stenosis - CBC with Differential/Platelet - CMP14+EGFR  10. Osteopenia, unspecified location - CBC with Differential/Platelet - CMP14+EGFR  Labs pending Continue current medications  Health Maintenance reviewed Diet and exercise encouraged  Follow up plan: 3 months    Bari Learn, FNP   "

## 2024-05-09 ENCOUNTER — Ambulatory Visit: Payer: Self-pay | Admitting: Family

## 2024-05-09 DIAGNOSIS — R7989 Other specified abnormal findings of blood chemistry: Secondary | ICD-10-CM

## 2024-05-09 LAB — CMP14+EGFR
ALT: 9 IU/L (ref 0–32)
AST: 10 IU/L (ref 0–40)
Albumin: 4.5 g/dL (ref 3.8–4.8)
Alkaline Phosphatase: 65 IU/L (ref 49–135)
BUN/Creatinine Ratio: 31 — AB (ref 12–28)
BUN: 56 mg/dL — AB (ref 8–27)
Bilirubin Total: 0.2 mg/dL (ref 0.0–1.2)
CO2: 19 mmol/L — AB (ref 20–29)
Calcium: 10.2 mg/dL (ref 8.7–10.3)
Chloride: 105 mmol/L (ref 96–106)
Creatinine, Ser: 1.78 mg/dL — AB (ref 0.57–1.00)
Globulin, Total: 2.3 g/dL (ref 1.5–4.5)
Glucose: 130 mg/dL — AB (ref 70–99)
Potassium: 5.3 mmol/L — AB (ref 3.5–5.2)
Sodium: 140 mmol/L (ref 134–144)
Total Protein: 6.8 g/dL (ref 6.0–8.5)
eGFR: 30 mL/min/1.73 — AB

## 2024-05-09 LAB — CBC WITH DIFFERENTIAL/PLATELET
Basophils Absolute: 0.1 x10E3/uL (ref 0.0–0.2)
Basos: 1 %
EOS (ABSOLUTE): 0.1 x10E3/uL (ref 0.0–0.4)
Eos: 2 %
Hematocrit: 40.2 % (ref 34.0–46.6)
Hemoglobin: 12.9 g/dL (ref 11.1–15.9)
Immature Grans (Abs): 0 x10E3/uL (ref 0.0–0.1)
Immature Granulocytes: 0 %
Lymphocytes Absolute: 1.7 x10E3/uL (ref 0.7–3.1)
Lymphs: 25 %
MCH: 30.6 pg (ref 26.6–33.0)
MCHC: 32.1 g/dL (ref 31.5–35.7)
MCV: 95 fL (ref 79–97)
Monocytes Absolute: 0.3 x10E3/uL (ref 0.1–0.9)
Monocytes: 5 %
Neutrophils Absolute: 4.7 x10E3/uL (ref 1.4–7.0)
Neutrophils: 67 %
Platelets: 312 x10E3/uL (ref 150–450)
RBC: 4.22 x10E6/uL (ref 3.77–5.28)
RDW: 11.9 % (ref 11.7–15.4)
WBC: 7 x10E3/uL (ref 3.4–10.8)

## 2024-05-16 ENCOUNTER — Other Ambulatory Visit

## 2024-05-16 DIAGNOSIS — R7989 Other specified abnormal findings of blood chemistry: Secondary | ICD-10-CM

## 2024-05-16 LAB — BMP8+EGFR
BUN/Creatinine Ratio: 26 (ref 12–28)
BUN: 38 mg/dL — ABNORMAL HIGH (ref 8–27)
CO2: 19 mmol/L — ABNORMAL LOW (ref 20–29)
Calcium: 9.9 mg/dL (ref 8.7–10.3)
Chloride: 107 mmol/L — ABNORMAL HIGH (ref 96–106)
Creatinine, Ser: 1.49 mg/dL — ABNORMAL HIGH (ref 0.57–1.00)
Glucose: 140 mg/dL — ABNORMAL HIGH (ref 70–99)
Potassium: 5.5 mmol/L — ABNORMAL HIGH (ref 3.5–5.2)
Sodium: 140 mmol/L (ref 134–144)
eGFR: 37 mL/min/1.73 — ABNORMAL LOW

## 2024-05-22 ENCOUNTER — Ambulatory Visit: Payer: Self-pay | Admitting: Family

## 2024-05-22 DIAGNOSIS — E875 Hyperkalemia: Secondary | ICD-10-CM

## 2024-05-23 ENCOUNTER — Other Ambulatory Visit: Payer: Self-pay | Admitting: Family

## 2024-05-23 MED ORDER — FUROSEMIDE 20 MG PO TABS: 20.0000 mg | ORAL_TABLET | Freq: Every day | ORAL | 11 refills | Status: AC

## 2024-05-23 NOTE — Progress Notes (Signed)
 Stop lisinopril  20 mg and start Lasix  20 mg daily. Make sure she is drinking enough fluids. If she were to have vomiting or sick she should hold this lasix .   Follow up in office in 2 weeks to recheck. She can take Kayexalate  rx.

## 2024-05-27 DIAGNOSIS — E875 Hyperkalemia: Secondary | ICD-10-CM | POA: Insufficient documentation

## 2024-05-27 MED ORDER — SODIUM POLYSTYRENE SULFONATE 15 GM/60ML CO SUSP: 30.0000 g | Freq: Every day | 2 refills | Status: AC | PRN

## 2024-06-02 ENCOUNTER — Inpatient Hospital Stay: Admission: RE | Admit: 2024-06-02 | Source: Ambulatory Visit

## 2024-06-10 ENCOUNTER — Other Ambulatory Visit

## 2024-08-07 ENCOUNTER — Ambulatory Visit: Admitting: Family
